# Patient Record
Sex: Female | Born: 1970
Health system: Southern US, Community
[De-identification: ages and names within clinical notes are randomized; demographics above are authoritative.]

## PROBLEM LIST (undated history)

## (undated) DIAGNOSIS — Z86718 Personal history of other venous thrombosis and embolism: Secondary | ICD-10-CM

## (undated) DIAGNOSIS — R059 Cough, unspecified: Secondary | ICD-10-CM

## (undated) DIAGNOSIS — G4459 Other complicated headache syndrome: Secondary | ICD-10-CM

## (undated) DIAGNOSIS — I82409 Acute embolism and thrombosis of unspecified deep veins of unspecified lower extremity: Secondary | ICD-10-CM

## (undated) DIAGNOSIS — T40601A Poisoning by unspecified narcotics, accidental (unintentional), initial encounter: Secondary | ICD-10-CM

## (undated) DIAGNOSIS — I10 Essential (primary) hypertension: Secondary | ICD-10-CM

## (undated) DIAGNOSIS — D649 Anemia, unspecified: Secondary | ICD-10-CM

## (undated) DIAGNOSIS — I739 Peripheral vascular disease, unspecified: Secondary | ICD-10-CM

## (undated) DIAGNOSIS — G629 Polyneuropathy, unspecified: Secondary | ICD-10-CM

## (undated) DIAGNOSIS — I350 Nonrheumatic aortic (valve) stenosis: Secondary | ICD-10-CM

## (undated) DIAGNOSIS — E1151 Type 2 diabetes mellitus with diabetic peripheral angiopathy without gangrene: Secondary | ICD-10-CM

## (undated) DIAGNOSIS — I2510 Atherosclerotic heart disease of native coronary artery without angina pectoris: Secondary | ICD-10-CM

## (undated) DIAGNOSIS — I25119 Atherosclerotic heart disease of native coronary artery with unspecified angina pectoris: Secondary | ICD-10-CM

## (undated) DIAGNOSIS — E785 Hyperlipidemia, unspecified: Secondary | ICD-10-CM

## (undated) DIAGNOSIS — F32A Depression, unspecified: Secondary | ICD-10-CM

## (undated) DIAGNOSIS — E119 Type 2 diabetes mellitus without complications: Secondary | ICD-10-CM

## (undated) DIAGNOSIS — Z720 Tobacco use: Secondary | ICD-10-CM

## (undated) DIAGNOSIS — E7800 Pure hypercholesterolemia, unspecified: Secondary | ICD-10-CM

## (undated) DIAGNOSIS — J449 Chronic obstructive pulmonary disease, unspecified: Secondary | ICD-10-CM

## (undated) DIAGNOSIS — R05 Cough: Secondary | ICD-10-CM

## (undated) DIAGNOSIS — F1120 Opioid dependence, uncomplicated: Secondary | ICD-10-CM

## (undated) HISTORY — DX: Depression, unspecified: F32.A

## (undated) HISTORY — PX: CARDIAC CATHETERIZATION: SHX172

## (undated) HISTORY — DX: Atherosclerotic heart disease of native coronary artery without angina pectoris: I25.10

## (undated) HISTORY — DX: Type 2 diabetes mellitus without complications: E11.9

## (undated) HISTORY — DX: Chronic obstructive pulmonary disease, unspecified: J44.9

## (undated) HISTORY — DX: Hyperlipidemia, unspecified: E78.5

## (undated) HISTORY — DX: Acute embolism and thrombosis of unspecified deep veins of unspecified lower extremity: I82.409

## (undated) HISTORY — DX: Polyneuropathy, unspecified: G62.9

## (undated) HISTORY — DX: Atherosclerotic heart disease of native coronary artery with unspecified angina pectoris: I25.119

## (undated) HISTORY — DX: Other complicated headache syndrome: G44.59

## (undated) HISTORY — DX: Anemia, unspecified: D64.9

## (undated) HISTORY — DX: Cough: R05

## (undated) HISTORY — DX: Poisoning by unspecified narcotics, accidental (unintentional), initial encounter: T40.601A

## (undated) HISTORY — PX: CARPAL TUNNEL RELEASE: SHX101

## (undated) HISTORY — DX: Cough, unspecified: R05.9

---

## 2007-08-01 ENCOUNTER — Emergency Department (HOSPITAL_COMMUNITY): Admission: EM | Admit: 2007-08-01 | Discharge: 2007-08-01 | Payer: Self-pay | Admitting: Emergency Medicine

## 2011-01-05 LAB — POCT I-STAT, CHEM 8
BUN: 10
Calcium, Ion: 1.21
Chloride: 105
Glucose, Bld: 131 — ABNORMAL HIGH
TCO2: 23

## 2011-01-05 LAB — POCT CARDIAC MARKERS
CKMB, poc: <1 — ABNORMAL LOW
Myoglobin, poc: 34.7
Operator id: 277751
Troponin i, poc: <0.05
Troponin i, poc: <0.05

## 2011-01-05 LAB — DIFFERENTIAL
Eosinophils Relative: 1
Lymphocytes Relative: 32
Monocytes Absolute: 0.4
Monocytes Relative: 6
Neutro Abs: 4.3

## 2011-01-05 LAB — CBC
HCT: 38.9
Hemoglobin: 13.5
MCHC: 34.7
RBC: 4.52
RDW: 14.1

## 2013-08-14 ENCOUNTER — Encounter (HOSPITAL_COMMUNITY): Payer: Self-pay | Admitting: Emergency Medicine

## 2013-08-14 ENCOUNTER — Emergency Department (HOSPITAL_COMMUNITY)
Admission: EM | Admit: 2013-08-14 | Discharge: 2013-08-15 | Discharge status: Against Medical Advice | Payer: No Typology Code available for payment source | Attending: Emergency Medicine | Admitting: Emergency Medicine

## 2013-08-14 DIAGNOSIS — M7981 Nontraumatic hematoma of soft tissue: Secondary | ICD-10-CM | POA: Insufficient documentation

## 2013-08-14 DIAGNOSIS — E1159 Type 2 diabetes mellitus with other circulatory complications: Secondary | ICD-10-CM | POA: Insufficient documentation

## 2013-08-14 DIAGNOSIS — I2510 Atherosclerotic heart disease of native coronary artery without angina pectoris: Secondary | ICD-10-CM | POA: Insufficient documentation

## 2013-08-14 DIAGNOSIS — I798 Other disorders of arteries, arterioles and capillaries in diseases classified elsewhere: Secondary | ICD-10-CM | POA: Insufficient documentation

## 2013-08-14 DIAGNOSIS — F17200 Nicotine dependence, unspecified, uncomplicated: Secondary | ICD-10-CM | POA: Insufficient documentation

## 2013-08-14 DIAGNOSIS — I10 Essential (primary) hypertension: Secondary | ICD-10-CM | POA: Insufficient documentation

## 2013-08-14 HISTORY — DX: Type 2 diabetes mellitus without complications: E11.9

## 2013-08-14 HISTORY — DX: Essential (primary) hypertension: I10

## 2013-08-14 HISTORY — DX: Pure hypercholesterolemia, unspecified: E78.00

## 2013-08-14 NOTE — ED Notes (Signed)
Called for pt for triage and no answer

## 2013-08-14 NOTE — ED Notes (Signed)
Called X3 with no response

## 2013-08-14 NOTE — ED Notes (Addendum)
Pt. reports left foot / bruise at 4th/5th left toes onset 4 days ago , denies injury , ambulatory , respirations unlabored , pt. stated doppler studies at [facility] today at left foot . Faint pedal pulses.

## 2013-08-15 ENCOUNTER — Encounter (HOSPITAL_COMMUNITY): Payer: Self-pay | Admitting: Emergency Medicine

## 2013-08-15 ENCOUNTER — Inpatient Hospital Stay (HOSPITAL_COMMUNITY)
Admission: EM | Admit: 2013-08-15 | Discharge: 2013-08-20 | DRG: 253 | Disposition: A | Discharge status: Home / Self Care | Payer: No Typology Code available for payment source | Attending: Internal Medicine | Admitting: Internal Medicine

## 2013-08-15 DIAGNOSIS — M79609 Pain in unspecified limb: Secondary | ICD-10-CM

## 2013-08-15 DIAGNOSIS — L03119 Cellulitis of unspecified part of limb: Secondary | ICD-10-CM

## 2013-08-15 DIAGNOSIS — L02619 Cutaneous abscess of unspecified foot: Secondary | ICD-10-CM | POA: Diagnosis present

## 2013-08-15 DIAGNOSIS — E785 Hyperlipidemia, unspecified: Secondary | ICD-10-CM | POA: Diagnosis present

## 2013-08-15 DIAGNOSIS — E1159 Type 2 diabetes mellitus with other circulatory complications: Secondary | ICD-10-CM | POA: Diagnosis not present

## 2013-08-15 DIAGNOSIS — L03116 Cellulitis of left lower limb: Secondary | ICD-10-CM

## 2013-08-15 DIAGNOSIS — E1151 Type 2 diabetes mellitus with diabetic peripheral angiopathy without gangrene: Secondary | ICD-10-CM | POA: Diagnosis present

## 2013-08-15 DIAGNOSIS — F17200 Nicotine dependence, unspecified, uncomplicated: Secondary | ICD-10-CM | POA: Diagnosis present

## 2013-08-15 DIAGNOSIS — I2510 Atherosclerotic heart disease of native coronary artery without angina pectoris: Secondary | ICD-10-CM | POA: Diagnosis present

## 2013-08-15 DIAGNOSIS — I798 Other disorders of arteries, arterioles and capillaries in diseases classified elsewhere: Secondary | ICD-10-CM | POA: Diagnosis present

## 2013-08-15 DIAGNOSIS — I10 Essential (primary) hypertension: Secondary | ICD-10-CM

## 2013-08-15 DIAGNOSIS — Z720 Tobacco use: Secondary | ICD-10-CM | POA: Diagnosis present

## 2013-08-15 DIAGNOSIS — E871 Hypo-osmolality and hyponatremia: Secondary | ICD-10-CM | POA: Diagnosis present

## 2013-08-15 DIAGNOSIS — I743 Embolism and thrombosis of arteries of the lower extremities: Principal | ICD-10-CM | POA: Diagnosis present

## 2013-08-15 HISTORY — DX: Atherosclerotic heart disease of native coronary artery without angina pectoris: I25.10

## 2013-08-15 HISTORY — DX: Tobacco use: Z72.0

## 2013-08-15 HISTORY — DX: Type 2 diabetes mellitus with diabetic peripheral angiopathy without gangrene: E11.51

## 2013-08-15 HISTORY — DX: Essential (primary) hypertension: I10

## 2013-08-15 HISTORY — DX: Cellulitis of left lower limb: L03.116

## 2013-08-15 HISTORY — DX: Embolism and thrombosis of arteries of the lower extremities: I74.3

## 2013-08-15 LAB — CBC WITH DIFFERENTIAL/PLATELET
Basophils Absolute: 0 10*3/uL (ref 0.0–0.1)
Basophils Relative: 0 % (ref 0–1)
EOS ABS: 0.1 10*3/uL (ref 0.0–0.7)
EOS PCT: 1 % (ref 0–5)
HEMATOCRIT: 42.6 % (ref 36.0–46.0)
HEMOGLOBIN: 14.6 g/dL (ref 12.0–15.0)
LYMPHS ABS: 2.5 10*3/uL (ref 0.7–4.0)
LYMPHS PCT: 26 % (ref 12–46)
MCH: 29.9 pg (ref 26.0–34.0)
MCHC: 34.3 g/dL (ref 30.0–36.0)
MCV: 87.3 fL (ref 78.0–100.0)
MONOS PCT: 6 % (ref 3–12)
Monocytes Absolute: 0.6 10*3/uL (ref 0.1–1.0)
Neutro Abs: 6.3 10*3/uL (ref 1.7–7.7)
Neutrophils Relative %: 67 % (ref 43–77)
Platelets: 200 10*3/uL (ref 150–400)
RBC: 4.88 MIL/uL (ref 3.87–5.11)
RDW: 14 % (ref 11.5–15.5)
WBC: 9.6 10*3/uL (ref 4.0–10.5)

## 2013-08-15 LAB — I-STAT CG4 LACTIC ACID, ED: Lactic Acid, Venous: 1.03 mmol/L (ref 0.5–2.2)

## 2013-08-15 LAB — BASIC METABOLIC PANEL
BUN: 8 mg/dL (ref 6–23)
CALCIUM: 8.8 mg/dL (ref 8.4–10.5)
CO2: 20 meq/L (ref 19–32)
Chloride: 98 mEq/L (ref 96–112)
Creatinine, Ser: 0.44 mg/dL — ABNORMAL LOW (ref 0.50–1.10)
GFR calc Af Amer: >90 mL/min (ref >90)
GLUCOSE: 181 mg/dL — AB (ref 70–99)
Potassium: 3.7 mEq/L (ref 3.7–5.3)
Sodium: 131 mEq/L — ABNORMAL LOW (ref 137–147)

## 2013-08-15 LAB — PROTIME-INR
INR: 0.87 (ref 0.00–1.49)
Prothrombin Time: 11.7 seconds (ref 11.6–15.2)

## 2013-08-15 LAB — CK: Total CK: 62 U/L (ref 7–177)

## 2013-08-15 LAB — GLUCOSE, CAPILLARY: Glucose-Capillary: 263 mg/dL — ABNORMAL HIGH (ref 70–99)

## 2013-08-15 LAB — APTT: APTT: 29 s (ref 24–37)

## 2013-08-15 MED ORDER — SODIUM CHLORIDE 0.9 % IV SOLN
INTRAVENOUS | Status: DC
  Administered 2013-08-15 (×2): via INTRAVENOUS

## 2013-08-15 MED ORDER — CEFAZOLIN SODIUM 1-5 GM-% IV SOLN
1.0000 g | Freq: Three times a day (TID) | INTRAVENOUS | Status: DC
  Filled 2013-08-15 (×2): qty 50

## 2013-08-15 MED ORDER — NICOTINE 21 MG/24HR TD PT24
21.0000 mg | MEDICATED_PATCH | Freq: Every day | TRANSDERMAL | Status: DC
Start: 2013-08-15 — End: 2013-08-15

## 2013-08-15 MED ORDER — SODIUM CHLORIDE 0.9 % IJ SOLN
3.0000 mL | Freq: Two times a day (BID) | INTRAMUSCULAR | Status: DC
  Administered 2013-08-16 – 2013-08-19 (×6): 3 mL via INTRAVENOUS

## 2013-08-15 MED ORDER — ATORVASTATIN CALCIUM 80 MG PO TABS
80.0000 mg | ORAL_TABLET | Freq: Every day | ORAL | Status: DC
  Administered 2013-08-15 – 2013-08-19 (×4): 80 mg via ORAL
  Filled 2013-08-15 (×7): qty 1

## 2013-08-15 MED ORDER — OXYCODONE HCL 5 MG PO TABS
10.0000 mg | ORAL_TABLET | ORAL | Status: DC | PRN
  Administered 2013-08-15 – 2013-08-16 (×5): 10 mg via ORAL
  Filled 2013-08-15 (×5): qty 2

## 2013-08-15 MED ORDER — CEFAZOLIN SODIUM 1-5 GM-% IV SOLN
1.0000 g | Freq: Three times a day (TID) | INTRAVENOUS | Status: DC
  Administered 2013-08-15 – 2013-08-16 (×2): 1 g via INTRAVENOUS
  Administered 2013-08-16 (×2): 2 g via INTRAVENOUS
  Administered 2013-08-17 – 2013-08-18 (×4): 1 g via INTRAVENOUS
  Filled 2013-08-15 (×10): qty 50

## 2013-08-15 MED ORDER — SODIUM CHLORIDE 0.9 % IV SOLN
INTRAVENOUS | Status: DC
  Administered 2013-08-16 (×2): via INTRAVENOUS

## 2013-08-15 MED ORDER — DOCUSATE SODIUM 100 MG PO CAPS
100.0000 mg | ORAL_CAPSULE | Freq: Two times a day (BID) | ORAL | Status: DC
  Administered 2013-08-16 – 2013-08-20 (×7): 100 mg via ORAL
  Filled 2013-08-15 (×11): qty 1

## 2013-08-15 MED ORDER — ACETAMINOPHEN 325 MG PO TABS: 650.0000 mg | ORAL_TABLET | Freq: Four times a day (QID) | ORAL | Status: DC | PRN

## 2013-08-15 MED ORDER — NICOTINE POLACRILEX 2 MG MT GUM
4.0000 mg | CHEWING_GUM | OROMUCOSAL | Status: DC | PRN
  Filled 2013-08-15: qty 2

## 2013-08-15 MED ORDER — ONDANSETRON HCL 4 MG/2ML IJ SOLN
4.0000 mg | Freq: Four times a day (QID) | INTRAMUSCULAR | Status: DC | PRN
  Administered 2013-08-16: 4 mg via INTRAVENOUS
  Filled 2013-08-15: qty 2

## 2013-08-15 MED ORDER — BISACODYL 10 MG RE SUPP: 10.0000 mg | Freq: Every day | RECTAL | Status: DC | PRN

## 2013-08-15 MED ORDER — POLYETHYLENE GLYCOL 3350 17 G PO PACK
17.0000 g | PACK | Freq: Every day | ORAL | Status: DC | PRN
  Filled 2013-08-15: qty 1

## 2013-08-15 MED ORDER — SENNA 8.6 MG PO TABS
1.0000 | ORAL_TABLET | Freq: Two times a day (BID) | ORAL | Status: DC
  Administered 2013-08-16 – 2013-08-20 (×6): 8.6 mg via ORAL
  Filled 2013-08-15 (×11): qty 1

## 2013-08-15 MED ORDER — MORPHINE SULFATE 4 MG/ML IJ SOLN
4.0000 mg | Freq: Once | INTRAMUSCULAR | Status: AC
  Administered 2013-08-15: 4 mg via INTRAVENOUS
  Filled 2013-08-15: qty 1

## 2013-08-15 MED ORDER — HEPARIN (PORCINE) IN NACL 100-0.45 UNIT/ML-% IJ SOLN
1450.0000 [IU]/h | INTRAMUSCULAR | Status: DC
  Administered 2013-08-15: 1150 [IU]/h via INTRAVENOUS
  Filled 2013-08-15 (×2): qty 250

## 2013-08-15 MED ORDER — ONDANSETRON HCL 4 MG PO TABS
4.0000 mg | ORAL_TABLET | Freq: Four times a day (QID) | ORAL | Status: DC | PRN
  Administered 2013-08-17: 4 mg via ORAL
  Filled 2013-08-15: qty 1

## 2013-08-15 MED ORDER — INSULIN ASPART 100 UNIT/ML ~~LOC~~ SOLN: 0.0000 [IU] | Freq: Every day | SUBCUTANEOUS | Status: DC

## 2013-08-15 MED ORDER — HEPARIN BOLUS VIA INFUSION
4000.0000 [IU] | Freq: Once | INTRAVENOUS | Status: AC
Start: 2013-08-15 — End: 2013-08-15
  Administered 2013-08-15: 4000 [IU] via INTRAVENOUS
  Filled 2013-08-15: qty 4000

## 2013-08-15 MED ORDER — HYDROMORPHONE HCL PF 1 MG/ML IJ SOLN
1.0000 mg | Freq: Once | INTRAMUSCULAR | Status: AC
  Administered 2013-08-15: 1 mg via INTRAVENOUS
  Filled 2013-08-15: qty 1

## 2013-08-15 MED ORDER — INSULIN ASPART 100 UNIT/ML ~~LOC~~ SOLN: 0.0000 [IU] | Freq: Three times a day (TID) | SUBCUTANEOUS | Status: DC

## 2013-08-15 MED ORDER — ACETAMINOPHEN 650 MG RE SUPP
650.0000 mg | Freq: Four times a day (QID) | RECTAL | Status: DC | PRN
Start: 2013-08-15 — End: 2013-08-20

## 2013-08-15 MED ORDER — NICOTINE 10 MG IN INHA: 1.0000 | RESPIRATORY_TRACT | Status: DC | PRN

## 2013-08-15 MED ORDER — HYDROMORPHONE HCL PF 1 MG/ML IJ SOLN
1.0000 mg | INTRAMUSCULAR | Status: DC | PRN
  Administered 2013-08-15 – 2013-08-17 (×5): 1 mg via INTRAVENOUS
  Filled 2013-08-15 (×5): qty 1

## 2013-08-15 MED ORDER — LORAZEPAM 2 MG/ML IJ SOLN
1.0000 mg | Freq: Once | INTRAMUSCULAR | Status: AC
  Administered 2013-08-15: 1 mg via INTRAVENOUS
  Filled 2013-08-15: qty 1

## 2013-08-15 MED ORDER — ONDANSETRON HCL 4 MG/2ML IJ SOLN
4.0000 mg | Freq: Once | INTRAMUSCULAR | Status: AC
  Administered 2013-08-15: 4 mg via INTRAVENOUS
  Filled 2013-08-15: qty 2

## 2013-08-15 MED ORDER — LISINOPRIL 5 MG PO TABS
5.0000 mg | ORAL_TABLET | Freq: Every day | ORAL | Status: DC
  Administered 2013-08-15: 5 mg via ORAL
  Filled 2013-08-15 (×2): qty 1

## 2013-08-15 NOTE — ED Notes (Signed)
NOTIFIED DR. POLLINA IN PERSON FOR PATIENTS LAB RESUTS OF CG4+ LACTIC ACID ,@13 :28 PM ,08/15/2013.

## 2013-08-15 NOTE — Progress Notes (Signed)
ANTICOAGULATION CONSULT NOTE - Initial Consult  Pharmacy Consult for heparin Indication: Arterial embolism LLE  Allergies  Allergen Reactions  . Fish Allergy   . Ibuprofen     Patient Measurements:   Heparin Dosing Weight: 80.7 kg  Vital Signs: Temp: 97.9 F (36.6 C) (05/06 2105) Temp src: Axillary (05/06 2105) BP: 129/64 mmHg (05/06 2105) Pulse Rate: 95 (05/06 2105)  Labs:  Recent Labs  08/15/13 1233  HGB 14.6  HCT 42.6  PLT 200  APTT 29  LABPROT 11.7  INR 0.87  CREATININE 0.44*  CKTOTAL 62    The CrCl is unknown because both a height and weight (above a minimum accepted value) are required for this calculation.   Medical History: Past Medical History  Diagnosis Date  . Hypertension   . Diabetes mellitus with peripheral vascular disease   . Hypercholesterolemia   . Tobacco abuse   . Coronary artery disease     NON OBSTRUCTIVE    Medications:  Scheduled:  . atorvastatin  80 mg Oral q1800  .  ceFAZolin (ANCEF) IV  1 g Intravenous 3 times per day  . docusate sodium  100 mg Oral BID  . insulin aspart  0-5 Units Subcutaneous QHS  . insulin aspart  0-9 Units Subcutaneous TID WC  . lisinopril  5 mg Oral Daily  . senna  1 tablet Oral BID  . sodium chloride  3 mL Intravenous Q12H   Infusions:  . sodium chloride 75 mL/hr at 08/15/13 1817    Assessment: 43 yo female with LLE arterial embolism will be started on heparin therapy.  SCr 0.44; Hgb 14.6 and Plt 200 K on 05/06.  Patient was not on any anticoagulation prior to admission. INR 0.87 and aPTT 29  Goal of Therapy:  Heparin level 0.3-0.7 units/ml Monitor platelets by anticoagulation protocol: Yes   Plan:  1) Heparin 4000 units iv bolus x1, then start heparin drip at 1150 units/hr 2) 6hr heparin level 3) Daily heparin level and CBC  Tsz-Yin Cyruss Arata 08/15/2013,9:58 PM

## 2013-08-15 NOTE — Consult Note (Signed)
[facility] Consult  Reason for Consult:  Ischemic toes Referring Physician:  ER MRN #:  [id number]  History of Present Illness: This is a 42 y.o. female who presents to the ER today with complaints of severe pain in the toes of her left foot.  She states that this started 5-8 days ago.  She states that she cannot move her toes on her left foot.  She states that she has had pain in both calves as well.  She states that she has not been able to walk more than a block due to pain in her legs.  She states that she has also had chest pain, which she had a cardiac catheterization in 2010, but does not know if a stent was placed or if she had angioplasty.  She does state that at that time, she did have blockage in one artery.  This was done at [facility].  She states she continues to have occasional chest pain and it seems more severe than it was in 2010.    She does smoke 1-1.5 ppd of cigarettes.  She has a hx of HTN, DM, and hypercholesterolemia, but is not on any medications for these.  Past Medical History  Diagnosis Date  . Hypertension   . Diabetes mellitus without complication   . Hypercholesterolemia    Past Surgical History  Procedure Laterality Date  . Carpal tunnel release      Allergies  Allergen Reactions  . Fish Allergy   . Ibuprofen     Prior to Admission medications   Medication Sig Start Date End Date Taking? Authorizing Provider  Oxycodone HCl 10 MG TABS Take 10 mg by mouth 3 (three) times daily.   Yes Historical Provider, MD    History   Social History  . Marital Status: Single    Spouse Name: N/A    Number of Children: N/A  . Years of Education: N/A   Occupational History  . Not on file.   Social History Main Topics  . Smoking status: Current Every Day Smoker  . Smokeless tobacco: Not on file  . Alcohol Use: No  . Drug Use: No  . Sexual Activity: Not on file   Other Topics Concern  . Not on file   Social History Narrative   . No narrative on file     ROS: [x] Positive   [ ] Negative   [ ] All sytems reviewed and are negative  Cardiovascular: [x] chest pain/pressure-history of cardiac cath in  [] palpitations [] SOB lying flat [] DOE [x] pain in legs while walking [x] pain in legs at rest [x] pain in legs at night [] non-healing ulcers [] hx of DVT [] swelling in legs  Pulmonary: [] productive cough [] asthma/wheezing [] home O2  Neurologic: [] weakness in [] arms [] legs [] numbness in [] arms [] legs [] hx of CVA [] mini stroke []difficulty speaking or slurred speech [] temporary loss of vision in one eye [] dizziness  Hematologic: [] hx of cancer [] bleeding problems [] problems with blood clotting easily  Endocrine:   [x] diabetes [] thyroid disease  GI [] vomiting blood [] blood in stool  GU: [] CKD/renal failure [] HD--[] M/W/F or [] T/T/S [] burning with urination [] blood in urine  Psychiatric: [] anxiety [] depression  Musculoskeletal: [] arthritis [] joint pain  Integumentary: [] rashes [] ulcers  Constitutional: [] fever [] chills   Physical Examination    Filed Vitals:   08/15/13 1300  BP: 142/56  Pulse: 77  Temp:   Resp:    There is no weight on file to calculate BMI.  General:  WDWN in NAD Gait: Not observed HENT: WNL, normocephalic Eyes: Pupils equal Pulmonary: normal non-labored breathing, without Rales, rhonchi,  wheezing Cardiac: regular, without  Murmurs, rubs or gallops; without carotid bruits Abdomen: soft, NT/ND, no masses Skin: with erythema to dorsum of left foot, without ulcers  Vascular Exam/Pulses:  Right Left  Radial 2+ (normal) 2+ (normal)  Ulnar 1+ (weak) Non palpable  Femoral 3+ (normal) 3+ (normal)  Popliteal 2+ (normal) Non palpable  DP 2+ (normal) Non palpable  PT 1+ (weak) Non palpable   Extremities: with ischemic changes to the 1st, 4th and 5th left toes, without Gangrene , with cellulitis of dorsum of left  foot; without open wounds; + monophasic signal of the left PT/DP; + biphasic signal of the right DP/PT; diminished dorsiflexion of left foot.  Calves are non tender bilaterally Musculoskeletal: no muscle wasting or atrophy  Neurologic: A&O X 3; Appropriate Affect ; SENSATION: normal; MOTOR FUNCTION:  moving all extremities equally. Speech is fluent/normal Psychiatric:  Mildly distressed but appropriate  Lymph:  No inguinal lymphadenopathy   CBC    Component Value Date/Time   WBC 9.6 08/15/2013 1233   RBC 4.88 08/15/2013 1233   HGB 14.6 08/15/2013 1233   HCT 42.6 08/15/2013 1233   PLT 200 08/15/2013 1233   MCV 87.3 08/15/2013 1233   MCH 29.9 08/15/2013 1233   MCHC 34.3 08/15/2013 1233   RDW 14.0 08/15/2013 1233   LYMPHSABS 2.5 08/15/2013 1233   MONOABS 0.6 08/15/2013 1233   EOSABS 0.1 08/15/2013 1233   BASOSABS 0.0 08/15/2013 1233    BMET    Component Value Date/Time   NA 131* 08/15/2013 1233   K 3.7 08/15/2013 1233   CL 98 08/15/2013 1233   CO2 20 08/15/2013 1233   GLUCOSE 181* 08/15/2013 1233   BUN 8 08/15/2013 1233   CREATININE 0.44* 08/15/2013 1233   CALCIUM 8.8 08/15/2013 1233   GFRNONAA >90 08/15/2013 1233   GFRAA >90 08/15/2013 1233    COAGS: Lab Results  Component Value Date   INR 0.87 08/15/2013     Non-Invasive Vascular Imaging:   ABI's at outside facility 08/14/13: Right:  1.1 Left:  0.4  Statin:  no Beta Blocker:  no Aspirin:  no ACEI:  no ARB:  no Other antiplatelets/anticoagulants:  no   ASSESSMENT: This is a 43 y.o. female with ischemic toes of the left foot with severe pain that started ~ 5-8 days ago.  Has most likely embolized to toes of the left foot from an SFA lesion.  Will start heparin and have aortogram tomorrow to see if lesion is amendable to treatment in the [facility].    PLAN: -admit to hospital by hospitalist given her multiple medical problems -will start heparin and d/c on call to the [facility] -will schedule for aortogram with bilateral lower extremity runoff and possible  intervention by Dr. Bridgett Larsson tomorrow -labs in am -NPO after MN   Leontine Locket, PA-C [facility] [phone]  Agree with above assessment Patient with likely superficial femoral occlusive disease and embolus to toes of left foot. She does have chronic claudication symptoms. She does have diabetes mellitus and heavy history of tobacco abuse  Patient also has had cardiac catheterization but does not think she has had angioplasty or stent placed-not on regular followup Will treat  patient with IV heparin and perform angiography tomorrow per Dr.Chen to see if there is left leg lesion amenable to percutaneous intervention. We'll then need to have left foot follow to see if fourth and fifth toes heal  or if toe amputations eventually become indicated and necessary. Discussed this with patient and she is in agreement Patient to be admitted to medical service because of cardiac issues-history of intermittent chest discomfort but no history of myocardial infarction 

## 2013-08-15 NOTE — ED Notes (Signed)
Pt very angry, attempting to leave stating she wants her IV out. Dr. Betsey Holiday at bedside explaining options. Pt agreeable to stay.

## 2013-08-15 NOTE — ED Notes (Signed)
Pt sent here for blood clot in left leg. sts sent here for IV heparin. Pt toes blue on the left foot.

## 2013-08-15 NOTE — H&P (Addendum)
[facility] History and Physical  Toni Logan WJX:[id number] DOB: [birth year] DOA: 08/15/2013  Referring physician:  Malachy Moan PCP:  LAND, PHILLIP, PA-C   Chief Complaint:  Left foot pain and discoloration  HPI:  The patient is a 43 y.o. year-old female with history of diabetes mellitus, hypertension, hypercholesterolemia, tobacco abuse, chronic leg pain since childhood who presents with progressive pain and discoloration of the left fourth and fifth toes.  She has not taken medications in several years because she did not have health insurance.  She recently got health insurance in January, but had not gone her to primary doctor to restart medications.  The patient was last at their baseline health approximately 5 days ago.  She developed some bruising and "ripping" 10/10 pain on the left foot approximately 4-5 days ago which spread to her 4th and 5th toes of the left foot.  Movement, bearing weight, and touching toes makes this pain worse.  Oral oxycodone 10mg  did not help much.  She was seen a [facility] on 5/5 and underwent ABIs which reportedly demonstrated 0.4 on the left and 1.1 on the right. She had duplex which demonstrated mid superior femoral artery occlusion with reconstitution in the mid to distal SFA with prominent collaterals.  She was seen by Doctor Ruel Favors, vascular surgeon, in the ER, who believes this may be an acute embolic event caused by showering of clot from the femoral artery region.  She is being admitted to medicine for management of her chronic medical problems.    On ROS, she has had intermittent substernal chest pains without significant SOB, nausea, diaphoresis which occur at rest and with exertion.  These pains happen several times per day and have been occurring for several years.  She underwent cardiac catheterization 6 years ago which demonstrated non-occlusive CAD of a single vessel.    Review of Systems:  General:  Denies fevers, chills, weight  loss or gain HEENT:  Denies changes to hearing and vision, + recent rhinorrhea, sinus congestion, sore throat CV:  + intermittent chest pain. denies palpitations, lower extremity edema.  PULM:  Denies SOB, wheezing, + cough.   GI:  Denies nausea, vomiting, constipation, diarrhea.   GU:  Denies dysuria, frequency, urgency ENDO:  Denies polyuria, polydipsia.   HEME:  Denies hematemesis, blood in stools, melena, abnormal bruising or bleeding.  LYMPH:  Denies lymphadenopathy.   MSK:  Denies arthralgias, myalgias.   DERM:  Denies skin rash or ulcer.   NEURO:  Denies focal numbness, weakness, slurred speech, confusion, facial droop.  PSYCH:  Denies anxiety and depression.    Past Medical History  Diagnosis Date  . Hypertension   . Diabetes mellitus with peripheral vascular disease   . Hypercholesterolemia   . Tobacco abuse    Past Surgical History  Procedure Laterality Date  . Carpal tunnel release    . Cardiac catheterization     Social History:  reports that she has been smoking Cigarettes.  She has a 30 pack-year smoking history. She does not have any smokeless tobacco history on file. She reports that she does not drink alcohol or use illicit drugs. Lives with her wife in an apartment.  Does not use assist device, drives, independent for all ADLs  Allergies  Allergen Reactions  . Fish Allergy   . Ibuprofen     Family History  Problem Relation Age of Onset  . Heart failure Mother   . Heart attack Mother   . Heart disease Father  s/p CABG  . Diabetes    . High blood pressure    . High Cholesterol    . Lung cancer Maternal Uncle      Prior to Admission medications   Medication Sig Start Date End Date Taking? Authorizing Provider  Oxycodone HCl 10 MG TABS Take 10 mg by mouth 3 (three) times daily.   Yes Historical Provider, MD   Physical Exam: Filed Vitals:   08/15/13 1300 08/15/13 1400 08/15/13 1434 08/15/13 1637  BP: 142/56 168/77 190/98 149/64  Pulse: 77 106 131  73  Temp:    98.5 F (36.9 C)  TempSrc:    Oral  Resp:   24 16  SpO2: 94% 95% 98% 95%     General:  CF, NAD  Eyes:  PERRL, anicteric, non-injected.  ENT:  Nares clear.  OP clear, non-erythematous without plaques or exudates.  MMM.  Neck:  Supple without TM or JVD.    Lymph:  No cervical, supraclavicular, or submandibular LAD.  Cardiovascular:  RRR, normal S1, S2, 2/6 systolic murmur LSB and RUSB.    Respiratory:  CTA bilaterally without increased WOB.  Abdomen:  NABS.  Soft, ND/NT.    Skin:  No rashes or focal lesions.  Musculoskeletal:  Normal bulk and tone.  No LE edema.  Left foot absent pedal pulse to palpation, right foot 2+ pulse.  Half of first toe left foot dusky, all of 4th and 5th toes dusky with extension about 1-2cm below level of toe.  Erythema streaking over dorsum of foot to ankle   Psychiatric:  A & O x 4.  Appropriate affect.  Neurologic:  CN 3-12 intact.  5/5 strength.  Sensation intact.  Labs on Admission:  Basic Metabolic Panel:  Recent Labs Lab 08/15/13 1233  NA 131*  K 3.7  CL 98  CO2 20  GLUCOSE 181*  BUN 8  CREATININE 0.44*  CALCIUM 8.8   Liver Function Tests: No results found for this basename: AST, ALT, ALKPHOS, BILITOT, PROT, ALBUMIN,  in the last 168 hours No results found for this basename: LIPASE, AMYLASE,  in the last 168 hours No results found for this basename: AMMONIA,  in the last 168 hours CBC:  Recent Labs Lab 08/15/13 1233  WBC 9.6  NEUTROABS 6.3  HGB 14.6  HCT 42.6  MCV 87.3  PLT 200   Cardiac Enzymes:  Recent Labs Lab 08/15/13 1233  CKTOTAL 62    BNP (last 3 results) No results found for this basename: PROBNP,  in the last 8760 hours CBG: No results found for this basename: GLUCAP,  in the last 168 hours  Radiological Exams on Admission: No results found.  EKG: Independently reviewed. NSR, suggestion of previous septal infarct.  ECG read possible previous inferior infarct also, but not  convincing  Assessment/Plan Active Problems:   Arterial embolism of left leg  ---  Ischemic left toes due to arterial embolism of the left leg with possible overlying cellulitis or superficial thrombophlebitis -  Agree with antibiotics -  Heparin gtt -  Aortogram with bilateral lower extremity runoff tomorrow -  NPO after MN for angiography -  Dilaudid prn pain  Intermittent chest pains, atypical.  Does have risk factors for heart disease, however, and ECG is abnormal.  Attempted to get records from [facility]  -  ECHO  -  Telemetry -  Perioperative risk of CV between 0.42% and 2.4%  HTN/HLD, blood pressures elevated but likely due to agitation and pain -  Control  pain -  Start ACEI  -  Lipid panel -  Start atorvastatin  DM, hyperglycemic in ER -  Start low dose SSI -  A1c -  Diabetic diet   Hyponatremia, likely secondary to dehydration -  IVF and repeat BMP in AM  Tobacco abuse -  Patient declines patch b/c it causes rash -  Start puffer prn  Diet:  Diabetic, healthy heart Access:  PIV IVF:  yes Proph:  Heparin gtt  Code Status: full code Family Communication: patient and wife Disposition Plan: Admit to telemetry  Time spent: 60 min [facility] Pager [phone]  If 7PM-7AM, please contact night-coverage [email or web address] Password [facility] 08/15/2013, 4:38 PM

## 2013-08-15 NOTE — ED Notes (Signed)
Pt refusing nicotine patch states "it irritates my skin".

## 2013-08-15 NOTE — ED Provider Notes (Addendum)
CSN: [id number]     Arrival date & time 08/15/13  1026 History   First MD Initiated Contact with Patient 08/15/13 1123     Chief Complaint  Patient presents with  . DVT     (Consider location/radiation/quality/duration/timing/severity/associated sxs/prior Treatment) HPI Comments: Patient presents to the ER for evaluation of arterial blood clot in the left leg. Patient reports that she had onset of pain in the left foot 5 days ago. The next day, she noticed that her left fourth and fifth toes have turned blue. She underwent outpatient arterial ultrasound and ABI yesterday at [facility], both of which were abnormal. She was referred to the ER for further management.   Past Medical History  Diagnosis Date  . Hypertension   . Diabetes mellitus without complication   . Hypercholesterolemia    Past Surgical History  Procedure Laterality Date  . Carpal tunnel release     History reviewed. No pertinent family history. History  Substance Use Topics  . Smoking status: Current Every Day Smoker  . Smokeless tobacco: Not on file  . Alcohol Use: No   OB History   Grav Para Term Preterm Abortions TAB SAB Ect Mult Living                 Review of Systems  Musculoskeletal:       Foot pain  Skin: Positive for color change.  All other systems reviewed and are negative.     Allergies  Fish allergy and Ibuprofen  Home Medications   Prior to Admission medications   Medication Sig Start Date End Date Taking? Authorizing Provider  Oxycodone HCl 10 MG TABS Take 10 mg by mouth 3 (three) times daily.   Yes Historical Provider, MD   BP 190/98  Pulse 131  Temp(Src) 98.6 F (37 C) (Oral)  Resp 24  SpO2 98%  LMP 07/25/2013 Physical Exam  Constitutional: She is oriented to person, place, and time. She appears well-developed and well-nourished. No distress.  HENT:  Head: Normocephalic and atraumatic.  Right Ear: Hearing normal.  Left Ear: Hearing normal.  Nose: Nose normal.   Mouth/Throat: Oropharynx is clear and moist and mucous membranes are normal.  Eyes: Conjunctivae and EOM are normal. Pupils are equal, round, and reactive to light.  Neck: Normal range of motion. Neck supple.  Cardiovascular: Regular rhythm, S1 normal and S2 normal.  Exam reveals no gallop and no friction rub.   No murmur heard. Pulmonary/Chest: Effort normal and breath sounds normal. No respiratory distress. She exhibits no tenderness.  Abdominal: Soft. Normal appearance and bowel sounds are normal. There is no hepatosplenomegaly. There is no tenderness. There is no rebound, no guarding, no tenderness at McBurney's point and negative Murphy's sign. No hernia.  Musculoskeletal: Normal range of motion.  Neurological: She is alert and oriented to person, place, and time. She has normal strength. No cranial nerve deficit or sensory deficit. Coordination normal. GCS eye subscore is 4. GCS verbal subscore is 5. GCS motor subscore is 6.  Skin: Skin is warm, dry and intact. No rash noted. No cyanosis.     Psychiatric: She has a normal mood and affect. Her speech is normal and behavior is normal. Thought content normal.    ED Course  Procedures (including critical care time) Labs Review Labs Reviewed  BASIC METABOLIC PANEL - Abnormal; Notable for the following:    Sodium 131 (*)    Glucose, Bld 181 (*)    Creatinine, Ser 0.44 (*)  All other components within normal limits  CBC WITH DIFFERENTIAL  CK  APTT  PROTIME-INR  I-STAT CG4 LACTIC ACID, ED    Imaging Review No results found.   EKG Interpretation   Date/Time:  Wednesday Aug 15 2013 15:02:35 EDT Ventricular Rate:  80 PR Interval:  162 QRS Duration: 88 QT Interval:  388 QTC Calculation: 448 R Axis:   -27 Text Interpretation:  Sinus rhythm Inferior infarct, old Anteroseptal  infarct, old No significant change since last tracing Confirmed by Melinna Linarez   MD, Wing Schoch [phone]) on 08/15/2013 3:10:36 PM      MDM   Final  diagnoses:  Arterial embolism of left leg    Patient presents to the ER for evaluation of arterial occlusion of the left lower leg. Patient had onset of foot pain 5 days ago followed by cyanosis of the fourth and fifth digits 4 days ago. She had outpatient arterial ultrasound and ABI yesterday, both of which were abnormal. Ultrasound reveals no flow within a segment of the mid superficial femoral artery. There is reconstitution of flow in the mid to distal SFA. There is prominent vascular collaterals noted. ABI on the right was 1.1, ABI on the left was 0.4.  This is consistent with some chronic occlusion of the left lower leg with a superimposed acute process. Doctor Kellie Simmering, on call for vascular surgery has been consulted. He has seen the patient in the ER and recommended admission on IV heparin. Patient is to be admitted to the hospital today for internal medicine. Doctor Kellie Simmering anticipates intervention tomorrow.  Orpah Greek, MD 08/15/13 [place], MD 08/15/13 [phone]

## 2013-08-15 NOTE — ED Notes (Signed)
Attempted report 

## 2013-08-16 ENCOUNTER — Encounter (HOSPITAL_COMMUNITY)
Admission: EM | Disposition: A | Discharge status: Home / Self Care | Payer: Self-pay | Source: Home / Self Care | Attending: Internal Medicine

## 2013-08-16 ENCOUNTER — Inpatient Hospital Stay (HOSPITAL_COMMUNITY): Payer: No Typology Code available for payment source | Admitting: Anesthesiology

## 2013-08-16 ENCOUNTER — Encounter (HOSPITAL_COMMUNITY): Payer: No Typology Code available for payment source | Admitting: Anesthesiology

## 2013-08-16 DIAGNOSIS — I10 Essential (primary) hypertension: Secondary | ICD-10-CM | POA: Diagnosis not present

## 2013-08-16 DIAGNOSIS — I70229 Atherosclerosis of native arteries of extremities with rest pain, unspecified extremity: Secondary | ICD-10-CM

## 2013-08-16 DIAGNOSIS — I798 Other disorders of arteries, arterioles and capillaries in diseases classified elsewhere: Secondary | ICD-10-CM | POA: Diagnosis not present

## 2013-08-16 DIAGNOSIS — I743 Embolism and thrombosis of arteries of the lower extremities: Secondary | ICD-10-CM | POA: Diagnosis not present

## 2013-08-16 DIAGNOSIS — E1159 Type 2 diabetes mellitus with other circulatory complications: Secondary | ICD-10-CM | POA: Diagnosis not present

## 2013-08-16 HISTORY — PX: THROMBECTOMY AND REVISION OF ARTERIOVENTOUS (AV) GORETEX  GRAFT: SHX6120

## 2013-08-16 HISTORY — PX: ABDOMINAL ANGIOGRAM: SHX5499

## 2013-08-16 HISTORY — PX: PERCUTANEOUS STENT INTERVENTION: SHX5500

## 2013-08-16 LAB — BASIC METABOLIC PANEL
BUN: 7 mg/dL (ref 6–23)
CALCIUM: 8.5 mg/dL (ref 8.4–10.5)
CHLORIDE: 101 meq/L (ref 96–112)
CO2: 22 meq/L (ref 19–32)
CREATININE: 0.46 mg/dL — AB (ref 0.50–1.10)
GFR calc Af Amer: >90 mL/min (ref >90)
GFR calc non Af Amer: >90 mL/min (ref >90)
GLUCOSE: 157 mg/dL — AB (ref 70–99)
Potassium: 3.9 mEq/L (ref 3.7–5.3)
Sodium: 135 mEq/L — ABNORMAL LOW (ref 137–147)

## 2013-08-16 LAB — LIPID PANEL
CHOLESTEROL: 211 mg/dL — AB (ref 0–200)
HDL: 33 mg/dL — ABNORMAL LOW (ref >39)
LDL CALC: 129 mg/dL — AB (ref 0–99)
Total CHOL/HDL Ratio: 6.4 RATIO
Triglycerides: 247 mg/dL — ABNORMAL HIGH (ref <150)
VLDL: 49 mg/dL — ABNORMAL HIGH (ref 0–40)

## 2013-08-16 LAB — HEPARIN LEVEL (UNFRACTIONATED)

## 2013-08-16 LAB — GLUCOSE, CAPILLARY
GLUCOSE-CAPILLARY: 136 mg/dL — AB (ref 70–99)
Glucose-Capillary: 131 mg/dL — ABNORMAL HIGH (ref 70–99)
Glucose-Capillary: 134 mg/dL — ABNORMAL HIGH (ref 70–99)
Glucose-Capillary: 148 mg/dL — ABNORMAL HIGH (ref 70–99)

## 2013-08-16 LAB — CBC
HCT: 40.2 % (ref 36.0–46.0)
Hemoglobin: 13.8 g/dL (ref 12.0–15.0)
MCH: 30.1 pg (ref 26.0–34.0)
MCHC: 34.3 g/dL (ref 30.0–36.0)
MCV: 87.6 fL (ref 78.0–100.0)
Platelets: 201 10*3/uL (ref 150–400)
RBC: 4.59 MIL/uL (ref 3.87–5.11)
RDW: 14.2 % (ref 11.5–15.5)
WBC: 7.4 10*3/uL (ref 4.0–10.5)

## 2013-08-16 LAB — HEMOGLOBIN A1C
HEMOGLOBIN A1C: 7.9 % — AB (ref <5.7)
Mean Plasma Glucose: 180 mg/dL — ABNORMAL HIGH (ref <117)

## 2013-08-16 LAB — POCT ACTIVATED CLOTTING TIME
ACTIVATED CLOTTING TIME: 182 s
Activated Clotting Time: 215 seconds

## 2013-08-16 SURGERY — THROMBECTOMY AND REVISION OF ARTERIOVENTOUS (AV) GORETEX  GRAFT
Anesthesia: General | Site: Leg Lower | Laterality: Left

## 2013-08-16 SURGERY — ABDOMINAL ANGIOGRAM
Anesthesia: LOCAL

## 2013-08-16 MED ORDER — ROCURONIUM BROMIDE 100 MG/10ML IV SOLN
INTRAVENOUS | Status: DC | PRN
  Administered 2013-08-16: 40 mg via INTRAVENOUS

## 2013-08-16 MED ORDER — MIDAZOLAM HCL 2 MG/2ML IJ SOLN
INTRAMUSCULAR | Status: AC
  Filled 2013-08-16: qty 2

## 2013-08-16 MED ORDER — THROMBIN 20000 UNITS EX SOLR
CUTANEOUS | Status: AC
  Filled 2013-08-16: qty 20000

## 2013-08-16 MED ORDER — FENTANYL CITRATE 0.05 MG/ML IJ SOLN
INTRAMUSCULAR | Status: AC
  Filled 2013-08-16: qty 2

## 2013-08-16 MED ORDER — LACTATED RINGERS IV SOLN
INTRAVENOUS | Status: DC | PRN
  Administered 2013-08-16 (×3): via INTRAVENOUS

## 2013-08-16 MED ORDER — LORAZEPAM 1 MG PO TABS
1.0000 mg | ORAL_TABLET | Freq: Once | ORAL | Status: AC
  Administered 2013-08-17: 1 mg via ORAL
  Filled 2013-08-16: qty 1

## 2013-08-16 MED ORDER — LIDOCAINE HCL 4 % MT SOLN
OROMUCOSAL | Status: DC | PRN
  Administered 2013-08-16: 4 mL via TOPICAL

## 2013-08-16 MED ORDER — PROPOFOL 10 MG/ML IV BOLUS
INTRAVENOUS | Status: DC | PRN
  Administered 2013-08-16: 180 mg via INTRAVENOUS

## 2013-08-16 MED ORDER — 0.9 % SODIUM CHLORIDE (POUR BTL) OPTIME
TOPICAL | Status: DC | PRN
  Administered 2013-08-16: 1000 mL

## 2013-08-16 MED ORDER — MORPHINE SULFATE 2 MG/ML IJ SOLN
2.0000 mg | INTRAMUSCULAR | Status: DC | PRN
  Administered 2013-08-16: 2 mg via INTRAVENOUS

## 2013-08-16 MED ORDER — IOHEXOL 300 MG/ML  SOLN
INTRAMUSCULAR | Status: DC | PRN
Start: 2013-08-16 — End: 2013-08-16
  Administered 2013-08-16: 10 mL via INTRAVENOUS

## 2013-08-16 MED ORDER — CEFAZOLIN SODIUM-DEXTROSE 2-3 GM-% IV SOLR
INTRAVENOUS | Status: AC
  Filled 2013-08-16: qty 50

## 2013-08-16 MED ORDER — ONDANSETRON HCL 4 MG/2ML IJ SOLN: 4.0000 mg | Freq: Once | INTRAMUSCULAR | Status: AC | PRN

## 2013-08-16 MED ORDER — FENTANYL CITRATE 0.05 MG/ML IJ SOLN
INTRAMUSCULAR | Status: AC
  Filled 2013-08-16: qty 5

## 2013-08-16 MED ORDER — GLYCOPYRROLATE 0.2 MG/ML IJ SOLN
INTRAMUSCULAR | Status: AC
  Filled 2013-08-16: qty 2

## 2013-08-16 MED ORDER — ALUM & MAG HYDROXIDE-SIMETH 200-200-20 MG/5ML PO SUSP: 15.0000 mL | ORAL | Status: DC | PRN

## 2013-08-16 MED ORDER — OXYCODONE HCL 5 MG PO TABS
5.0000 mg | ORAL_TABLET | Freq: Once | ORAL | Status: AC
  Administered 2013-08-16: 5 mg via ORAL

## 2013-08-16 MED ORDER — ONDANSETRON HCL 4 MG/2ML IJ SOLN: 4.0000 mg | Freq: Once | INTRAMUSCULAR | Status: DC | PRN

## 2013-08-16 MED ORDER — HYDRALAZINE HCL 20 MG/ML IJ SOLN: 10.0000 mg | INTRAMUSCULAR | Status: DC | PRN

## 2013-08-16 MED ORDER — POTASSIUM CHLORIDE CRYS ER 20 MEQ PO TBCR: 20.0000 meq | EXTENDED_RELEASE_TABLET | Freq: Every day | ORAL | Status: DC | PRN

## 2013-08-16 MED ORDER — HYDROMORPHONE HCL PF 1 MG/ML IJ SOLN: 0.2500 mg | INTRAMUSCULAR | Status: DC | PRN

## 2013-08-16 MED ORDER — GUAIFENESIN-DM 100-10 MG/5ML PO SYRP: 15.0000 mL | ORAL_SOLUTION | ORAL | Status: DC | PRN

## 2013-08-16 MED ORDER — HEPARIN (PORCINE) IN NACL 100-0.45 UNIT/ML-% IJ SOLN
500.0000 [IU]/h | INTRAMUSCULAR | Status: DC
Start: 2013-08-16 — End: 2013-08-17
  Administered 2013-08-16: 500 [IU]/h via INTRAVENOUS
  Filled 2013-08-16: qty 250

## 2013-08-16 MED ORDER — PROPOFOL 10 MG/ML IV BOLUS
INTRAVENOUS | Status: AC
  Filled 2013-08-16: qty 20

## 2013-08-16 MED ORDER — LIDOCAINE HCL (CARDIAC) 20 MG/ML IV SOLN
INTRAVENOUS | Status: DC | PRN
  Administered 2013-08-16: 30 mg via INTRAVENOUS

## 2013-08-16 MED ORDER — ONDANSETRON HCL 4 MG/2ML IJ SOLN
INTRAMUSCULAR | Status: AC
  Filled 2013-08-16: qty 2

## 2013-08-16 MED ORDER — FENTANYL CITRATE 0.05 MG/ML IJ SOLN
INTRAMUSCULAR | Status: DC | PRN
  Administered 2013-08-16 (×4): 50 ug via INTRAVENOUS
  Administered 2013-08-16: 100 ug via INTRAVENOUS
  Administered 2013-08-16 (×3): 50 ug via INTRAVENOUS
  Administered 2013-08-16: 100 ug via INTRAVENOUS
  Administered 2013-08-16: 50 ug via INTRAVENOUS

## 2013-08-16 MED ORDER — HEPARIN (PORCINE) IN NACL 2-0.9 UNIT/ML-% IJ SOLN
INTRAMUSCULAR | Status: AC
  Filled 2013-08-16: qty 1000

## 2013-08-16 MED ORDER — PHENOL 1.4 % MT LIQD
1.0000 | OROMUCOSAL | Status: DC | PRN
  Filled 2013-08-16: qty 177

## 2013-08-16 MED ORDER — HEPARIN BOLUS VIA INFUSION
2000.0000 [IU] | Freq: Once | INTRAVENOUS | Status: AC
  Administered 2013-08-16: 2000 [IU] via INTRAVENOUS
  Filled 2013-08-16: qty 2000

## 2013-08-16 MED ORDER — HEPARIN SODIUM (PORCINE) 1000 UNIT/ML IJ SOLN
INTRAMUSCULAR | Status: DC | PRN
  Administered 2013-08-16: 7000 [IU] via INTRAVENOUS
  Administered 2013-08-16 (×2): 2000 [IU] via INTRAVENOUS

## 2013-08-16 MED ORDER — NEOSTIGMINE METHYLSULFATE 10 MG/10ML IV SOLN
INTRAVENOUS | Status: AC
  Filled 2013-08-16: qty 1

## 2013-08-16 MED ORDER — SODIUM CHLORIDE 0.9 % IV SOLN: 1.0000 mL/kg/h | INTRAVENOUS | Status: DC

## 2013-08-16 MED ORDER — LIDOCAINE HCL (CARDIAC) 20 MG/ML IV SOLN
INTRAVENOUS | Status: AC
Start: 2013-08-16 — End: 2013-08-16
  Filled 2013-08-16: qty 5

## 2013-08-16 MED ORDER — MORPHINE SULFATE 2 MG/ML IJ SOLN
2.0000 mg | INTRAMUSCULAR | Status: DC | PRN
  Administered 2013-08-16 – 2013-08-17 (×2): 2 mg via INTRAVENOUS
  Filled 2013-08-16 (×2): qty 1

## 2013-08-16 MED ORDER — ONDANSETRON HCL 4 MG/2ML IJ SOLN
INTRAMUSCULAR | Status: DC | PRN
  Administered 2013-08-16: 4 mg via INTRAVENOUS

## 2013-08-16 MED ORDER — NEOSTIGMINE METHYLSULFATE 10 MG/10ML IV SOLN
INTRAVENOUS | Status: DC | PRN
  Administered 2013-08-16: 3 mg via INTRAVENOUS

## 2013-08-16 MED ORDER — HYDROMORPHONE HCL PF 1 MG/ML IJ SOLN
INTRAMUSCULAR | Status: AC
  Filled 2013-08-16: qty 2

## 2013-08-16 MED ORDER — PHENYLEPHRINE HCL 10 MG/ML IJ SOLN
INTRAMUSCULAR | Status: DC | PRN
  Administered 2013-08-16 (×2): 80 ug via INTRAVENOUS

## 2013-08-16 MED ORDER — SODIUM CHLORIDE 0.9 % IV SOLN: 500.0000 mL | Freq: Once | INTRAVENOUS | Status: AC | PRN

## 2013-08-16 MED ORDER — HEPARIN SODIUM (PORCINE) 1000 UNIT/ML IJ SOLN
INTRAMUSCULAR | Status: AC
  Filled 2013-08-16: qty 1

## 2013-08-16 MED ORDER — PANTOPRAZOLE SODIUM 40 MG PO TBEC
40.0000 mg | DELAYED_RELEASE_TABLET | Freq: Every day | ORAL | Status: DC
  Administered 2013-08-17 – 2013-08-20 (×4): 40 mg via ORAL
  Filled 2013-08-16 (×4): qty 1

## 2013-08-16 MED ORDER — HYDROMORPHONE HCL PF 1 MG/ML IJ SOLN
0.5000 mg | INTRAMUSCULAR | Status: DC | PRN
  Administered 2013-08-16 (×3): 0.5 mg via INTRAVENOUS

## 2013-08-16 MED ORDER — DOPAMINE-DEXTROSE 3.2-5 MG/ML-% IV SOLN
3.0000 ug/kg/min | INTRAVENOUS | Status: DC
  Filled 2013-08-16: qty 250

## 2013-08-16 MED ORDER — OXYCODONE HCL 5 MG PO TABS
ORAL_TABLET | ORAL | Status: AC
  Filled 2013-08-16: qty 1

## 2013-08-16 MED ORDER — METOPROLOL TARTRATE 1 MG/ML IV SOLN
2.0000 mg | INTRAVENOUS | Status: DC | PRN
Start: 2013-08-16 — End: 2013-08-17

## 2013-08-16 MED ORDER — SODIUM CHLORIDE 0.9 % IR SOLN
Status: DC | PRN
  Administered 2013-08-16: 17:00:00

## 2013-08-16 MED ORDER — LABETALOL HCL 5 MG/ML IV SOLN
10.0000 mg | INTRAVENOUS | Status: DC | PRN
  Filled 2013-08-16: qty 4

## 2013-08-16 MED ORDER — LIDOCAINE HCL (PF) 1 % IJ SOLN
INTRAMUSCULAR | Status: AC
  Filled 2013-08-16: qty 30

## 2013-08-16 MED ORDER — MIDAZOLAM HCL 5 MG/5ML IJ SOLN
INTRAMUSCULAR | Status: DC | PRN
  Administered 2013-08-16: 2 mg via INTRAVENOUS

## 2013-08-16 MED ORDER — MORPHINE SULFATE 2 MG/ML IJ SOLN
INTRAMUSCULAR | Status: AC
  Filled 2013-08-16: qty 1

## 2013-08-16 MED ORDER — GLYCOPYRROLATE 0.2 MG/ML IJ SOLN
INTRAMUSCULAR | Status: DC | PRN
  Administered 2013-08-16: 0.4 mg via INTRAVENOUS

## 2013-08-16 MED ORDER — THROMBIN 20000 UNITS EX SOLR
CUTANEOUS | Status: DC | PRN
  Administered 2013-08-16: 19:00:00 via TOPICAL

## 2013-08-16 SURGICAL SUPPLY — 54 items
ADH SKN CLS APL DERMABOND .7 (GAUZE/BANDAGES/DRESSINGS) ×1
ARMBAND PINK RESTRICT EXTREMIT (MISCELLANEOUS) ×3 IMPLANT
BAG SNAP BAND KOVER 36X36 (MISCELLANEOUS) ×2 IMPLANT
BLADE 10 SAFETY STRL DISP (BLADE) ×3 IMPLANT
CANISTER SUCTION 2500CC (MISCELLANEOUS) ×3 IMPLANT
CATH EMB 3FR 80CM (CATHETERS) ×2 IMPLANT
CATH EMB 4FR 80CM (CATHETERS) ×3 IMPLANT
CLIP TI MEDIUM 6 (CLIP) ×3 IMPLANT
CLIP TI WIDE RED SMALL 6 (CLIP) ×3 IMPLANT
COVER DOME SNAP 22 D (MISCELLANEOUS) ×2 IMPLANT
COVER SURGICAL LIGHT HANDLE (MISCELLANEOUS) ×3 IMPLANT
DERMABOND ADVANCED (GAUZE/BANDAGES/DRESSINGS) ×2
DERMABOND ADVANCED .7 DNX12 (GAUZE/BANDAGES/DRESSINGS) ×1 IMPLANT
DRSG COVADERM 4X8 (GAUZE/BANDAGES/DRESSINGS) ×2 IMPLANT
ELECT REM PT RETURN 9FT ADLT (ELECTROSURGICAL) ×3
ELECTRODE REM PT RTRN 9FT ADLT (ELECTROSURGICAL) ×1 IMPLANT
GEL ULTRASOUND 20GR AQUASONIC (MISCELLANEOUS) IMPLANT
GLOVE BIO SURGEON STRL SZ7 (GLOVE) ×7 IMPLANT
GLOVE BIOGEL M 6.5 STRL (GLOVE) ×4 IMPLANT
GLOVE BIOGEL PI IND STRL 6.5 (GLOVE) IMPLANT
GLOVE BIOGEL PI IND STRL 7.5 (GLOVE) ×1 IMPLANT
GLOVE BIOGEL PI INDICATOR 6.5 (GLOVE) ×4
GLOVE BIOGEL PI INDICATOR 7.5 (GLOVE) ×10
GLOVE ECLIPSE 7.5 STRL STRAW (GLOVE) ×2 IMPLANT
GLOVE SS BIOGEL STRL SZ 7 (GLOVE) IMPLANT
GLOVE SUPERSENSE BIOGEL SZ 7 (GLOVE) ×4
GOWN STRL REUS W/ TWL LRG LVL3 (GOWN DISPOSABLE) ×3 IMPLANT
GOWN STRL REUS W/ TWL XL LVL3 (GOWN DISPOSABLE) IMPLANT
GOWN STRL REUS W/TWL LRG LVL3 (GOWN DISPOSABLE) ×21
GOWN STRL REUS W/TWL XL LVL3 (GOWN DISPOSABLE) ×15
KIT BASIN OR (CUSTOM PROCEDURE TRAY) ×3 IMPLANT
KIT ROOM TURNOVER OR (KITS) ×3 IMPLANT
NS IRRIG 1000ML POUR BTL (IV SOLUTION) ×3 IMPLANT
PACK CV ACCESS (CUSTOM PROCEDURE TRAY) ×1 IMPLANT
PACK PERIPHERAL VASCULAR (CUSTOM PROCEDURE TRAY) ×2 IMPLANT
PAD ARMBOARD 7.5X6 YLW CONV (MISCELLANEOUS) ×6 IMPLANT
PATCH VASCULAR VASCU GUARD 1X6 (Vascular Products) ×4 IMPLANT
SET MICROPUNCTURE 5F STIFF (MISCELLANEOUS) ×2 IMPLANT
SPONGE SURGIFOAM ABS GEL 100 (HEMOSTASIS) IMPLANT
STAPLER VISISTAT (STAPLE) ×2 IMPLANT
STOPCOCK 4 WAY LG BORE MALE ST (IV SETS) ×2 IMPLANT
SUT MNCRL AB 4-0 PS2 18 (SUTURE) ×3 IMPLANT
SUT PROLENE 6 0 BV (SUTURE) ×13 IMPLANT
SUT PROLENE 7 0 BV1 MDA (SUTURE) ×2 IMPLANT
SUT VIC AB 3-0 SH 27 (SUTURE) ×3
SUT VIC AB 3-0 SH 27X BRD (SUTURE) ×1 IMPLANT
SYR 3ML LL SCALE MARK (SYRINGE) ×2 IMPLANT
SYR TB 1ML LUER SLIP (SYRINGE) ×2 IMPLANT
TOWEL OR 17X24 6PK STRL BLUE (TOWEL DISPOSABLE) ×3 IMPLANT
TOWEL OR 17X26 10 PK STRL BLUE (TOWEL DISPOSABLE) ×9 IMPLANT
TRAY FOLEY CATH 14FRSI W/METER (CATHETERS) ×2 IMPLANT
TUBING EXTENTION W/L.L. (IV SETS) ×2 IMPLANT
UNDERPAD 30X30 INCONTINENT (UNDERPADS AND DIAPERS) ×3 IMPLANT
WATER STERILE IRR 1000ML POUR (IV SOLUTION) ×3 IMPLANT

## 2013-08-16 NOTE — Op Note (Signed)
OPERATIVE NOTE   PROCEDURE: 1.  Right common femoral artery cannulation under ultrasound guidance 2.  Placement of catheter in aorta 3.  Aortogram 4.  Second order arterial selection 5.  Left leg runoff 6.  Stenting and subintimal angioplasty of left superficial femoral artery x 2 (Viabahn 5 mm x 100 mm, 5 mm x 50 mm)  PRE-OPERATIVE DIAGNOSIS: left foot ischemia  POST-OPERATIVE DIAGNOSIS: same as above   SURGEON: Adele Barthel, MD  ANESTHESIA: conscious sedation  ESTIMATED BLOOD LOSS: 50 cc  CONTRAST: 140 cc  FINDING(S):  Aorta: widely patent  Right Left  RA patent patent  CIA patent patent  EIA patent patent  IIA patent patent  CFA patent patent  SFA  Patent proximally, occluded for 6 cm in mid-segment: resolved after stenting and angioplasty  PFA  patent  Pop  patent  Trif  patent  AT  Patent with poor blood flow with diffuse disease: improved after stenting SFA  Pero  Patent: patent if SFA stenting  PT  Patent down to foot initially: occluded after SFA stenting   SPECIMEN(S):  none  INDICATIONS:   Toni Logan is a 43 y.o. female who presents with left foot ischemia.  The patient presents for: aortogram, bilateral leg runoff and possible intervention.  I discussed with the patient the nature of angiographic procedures, especially the limited patencies of any endovascular intervention.  The patient is aware of that the risks of an angiographic procedure include but are not limited to: bleeding, infection, access site complications, renal failure, embolization, rupture of vessel, dissection, possible need for emergent surgical intervention, possible need for surgical procedures to treat the patient's pathology, and stroke and death.  The patient is aware of the risks and agrees to proceed.  DESCRIPTION: After full informed consent was obtained from the patient, the patient was brought back to the angiography suite.  The patient was placed supine upon the angiography  table and connected to monitoring equipment.  The patient was then given conscious sedation, the amounts of which are documented in the patient's chart.  The patient was prepped and drape in the standard fashion for an angiographic procedure.  At this point, attention was turned to the right groin.  Under ultrasound guidance, the right common femoral artery was cannulated with a micropuncture needle.  The microwire was advanced into the iliac arterial system.  The needle was exchanged for a microsheath, which was loaded into the common femoral artery over the wire.  The microwire was exchanged for a [facility] wire which was advanced into the aorta.  The microsheath was then exchanged for a 5-Fr sheath which was loaded into the common femoral artery.  The Omniflush catheter was then loaded over the wire up to the level of L1.  The catheter was connected to the power injector circuit.  After de-airring and de-clotting the circuit, a power injector aortogram was completed.  The [facility] wire was replaced in the catheter, and using the [place] and Omniflush catheter, the left common iliac artery was selected.  The catheter and wire were advanced into the external iliac artery.  The wire was removed and the catheter connected to the power injector circuit.  An automated left leg runoff was completed.  Based on the injection, I felt revascularization was possible and indicated given the ischemia in the left foot.  The wire was exchanged for a Rosen wire.  The patient's right femoral sheath was exchanged for a 6-Fr Destination sheath, which was lodged in  the left superficial femoral artery.  The dilator was removed.  The patient was given 7000 units of Heparin intravenously, which was a therapeutic bolus.  Using a Glidewire and Quickcross, I was able to traverse a chronic total occlusion in the right superficial femoral artery with a subintimal technique.  The wire behavior was not consistent with an acute clot.  I verified  re-entry into the above-the-knee popliteal artery.  Based on the presence of some arterial calcification distal to the re-entry, I felt a longer balloon was going to be necessary, so I selected a 4 mm x 100 mm balloon.  The balloon was placed to treat the entire subintimal segment and 2 cm distal to the re-entry site.  I inflated this balloon first to profile at 8 atm for 2 minutes.  Hand injection demonstrated some optimal outcome.  I replaced the balloon and inflated it to 12 atm for 2 minutes.  Again there residual stenosis in the subintimal plane, so I elected to stent this segment with a 5 mm x 100 mm Viabahn stent.  I was placed to overlap the previously angioplasties segment.  I deployed the stent and deployed it fully with the prior angioplasty balloon.  On completion images, there appeared to be either thrombus or a dissection distal to the stent, so I obtained a 5 mm x 50 mm Viabahn stent and deployed it overlapping the dissection or thrombus.  Completion imaging demonstrated greatly improved superficial femoral artery blood flow, as the superficial femoral artery flow now beats the profunda femoral artery collateral flow.  On the trifurcation injection, however, there was a new tibioperoneal trunk thrombus evident.  The anterior tibial artery blood flow was improved and there continued to be peroneal blood flow reconstituting a posterior tibial artery.    In my opinion, this is best treat with surgical thrombectomy, so at this point I stopped the case.  I replaced the dilator and wire in the sheath.  I pulled the sheath into the right common iliac artery and readvanced the wire.  The sheath was exchanged for a 6-Fr sheath.  I removed the wire.  The sheath was aspirated.  No clots were present and the sheath was reloaded with heparinized saline.    COMPLICATIONS: left tibioperoneal trunk thomboembolism  CONDITION: stable  Adele Barthel, MD [facility] [place] Office:  [phone] Pager: [phone]  08/16/2013, 10:54 AM

## 2013-08-16 NOTE — H&P (View-Only) (Signed)
[facility] [place]  Reason for Consult:  Ischemic toes Referring Physician:  ER MRN #:  [id number]  History of Present Illness: This is a 43 y.o. female who presents to the ER today with complaints of severe pain in the toes of her left foot.  She states that this started 5-8 days ago.  She states that she cannot move her toes on her left foot.  She states that she has had pain in both calves as well.  She states that she has not been able to walk more than a block due to pain in her legs.  She states that she has also had chest pain, which she had a cardiac catheterization in 2010, but does not know if a stent was placed or if she had angioplasty.  She does state that at that time, she did have blockage in one artery.  This was done at [facility].  She states she continues to have occasional chest pain and it seems more severe than it was in 2010.    She does smoke 1-1.5 ppd of cigarettes.  She has a hx of HTN, DM, and hypercholesterolemia, but is not on any medications for these.  Past Medical History  Diagnosis Date  . Hypertension   . Diabetes mellitus without complication   . Hypercholesterolemia    Past Surgical History  Procedure Laterality Date  . Carpal tunnel release      Allergies  Allergen Reactions  . Fish Allergy   . Ibuprofen     Prior to Admission medications   Medication Sig Start Date End Date Taking? Authorizing Provider  Oxycodone HCl 10 MG TABS Take 10 mg by mouth 3 (three) times daily.   Yes Historical Provider, MD    History   Social History  . Marital Status: Single    Spouse Name: N/A    Number of Children: N/A  . Years of Education: N/A   Occupational History  . Not on file.   Social History Main Topics  . Smoking status: Current Every Day Smoker  . Smokeless tobacco: Not on file  . Alcohol Use: No  . Drug Use: No  . Sexual Activity: Not on file   Other Topics Concern  . Not on file   Social History Narrative   . No narrative on file     ROS: [x]  Positive   [ ]  Negative   [ ]  All sytems reviewed and are negative  Cardiovascular: [x]  chest pain/pressure-history of cardiac cath in  []  palpitations []  SOB lying flat []  DOE [x]  pain in legs while walking [x]  pain in legs at rest [x]  pain in legs at night []  non-healing ulcers []  hx of DVT []  swelling in legs  Pulmonary: []  productive cough []  asthma/wheezing []  home O2  Neurologic: []  weakness in []  arms []  legs []  numbness in []  arms []  legs []  hx of CVA []  mini stroke [] difficulty speaking or slurred speech []  temporary loss of vision in one eye []  dizziness  Hematologic: []  hx of cancer []  bleeding problems []  problems with blood clotting easily  Endocrine:   [x]  diabetes []  thyroid disease  GI []  vomiting blood []  blood in stool  GU: []  CKD/renal failure []  HD--[]  M/W/F or []  T/T/S []  burning with urination []  blood in urine  Psychiatric: []  anxiety []  depression  Musculoskeletal: []  arthritis []  joint pain  Integumentary: []  rashes []  ulcers  Constitutional: []  fever []  chills   Physical Examination  Filed Vitals:   08/15/13 1300  BP: 142/56  Pulse: 77  Temp:   Resp:    There is no weight on file to calculate BMI.  General:  WDWN in NAD Gait: Not observed HENT: WNL, normocephalic Eyes: Pupils equal Pulmonary: normal non-labored breathing, without Rales, rhonchi,  wheezing Cardiac: regular, without  Murmurs, rubs or gallops; without carotid bruits Abdomen: soft, NT/ND, no masses Skin: with erythema to dorsum of left foot, without ulcers  Vascular Exam/Pulses:  Right Left  Radial 2+ (normal) 2+ (normal)  Ulnar 1+ (weak) Non palpable  Femoral 3+ (normal) 3+ (normal)  Popliteal 2+ (normal) Non palpable  DP 2+ (normal) Non palpable  PT 1+ (weak) Non palpable   Extremities: with ischemic changes to the 1st, 4th and 5th left toes, without Gangrene , with cellulitis of dorsum of left  foot; without open wounds; + monophasic signal of the left PT/DP; + biphasic signal of the right DP/PT; diminished dorsiflexion of left foot.  Calves are non tender bilaterally Musculoskeletal: no muscle wasting or atrophy  Neurologic: A&O X 3; Appropriate Affect ; SENSATION: normal; MOTOR FUNCTION:  moving all extremities equally. Speech is fluent/normal Psychiatric:  Mildly distressed but appropriate  Lymph:  No inguinal lymphadenopathy   CBC    Component Value Date/Time   WBC 9.6 08/15/2013 1233   RBC 4.88 08/15/2013 1233   HGB 14.6 08/15/2013 1233   HCT 42.6 08/15/2013 1233   PLT 200 08/15/2013 1233   MCV 87.3 08/15/2013 1233   MCH 29.9 08/15/2013 1233   MCHC 34.3 08/15/2013 1233   RDW 14.0 08/15/2013 1233   LYMPHSABS 2.5 08/15/2013 1233   MONOABS 0.6 08/15/2013 1233   EOSABS 0.1 08/15/2013 1233   BASOSABS 0.0 08/15/2013 1233    BMET    Component Value Date/Time   NA 131* 08/15/2013 1233   K 3.7 08/15/2013 1233   CL 98 08/15/2013 1233   CO2 20 08/15/2013 1233   GLUCOSE 181* 08/15/2013 1233   BUN 8 08/15/2013 1233   CREATININE 0.44* 08/15/2013 1233   CALCIUM 8.8 08/15/2013 1233   GFRNONAA >90 08/15/2013 1233   GFRAA >90 08/15/2013 1233    COAGS: Lab Results  Component Value Date   INR 0.87 08/15/2013     Non-Invasive Vascular Imaging:   ABI's at outside facility 08/14/13: Right:  1.1 Left:  0.4  Statin:  no Beta Blocker:  no Aspirin:  no ACEI:  no ARB:  no Other antiplatelets/anticoagulants:  no   ASSESSMENT: This is a 43 y.o. female with ischemic toes of the left foot with severe pain that started ~ 5-8 days ago.  Has most likely embolized to toes of the left foot from an SFA lesion.  Will start heparin and have aortogram tomorrow to see if lesion is amendable to treatment in the [facility].    PLAN: -admit to hospital by hospitalist given her multiple medical problems -will start heparin and d/c on call to the [facility] -will schedule for aortogram with bilateral lower extremity runoff and possible  intervention by Dr. Bridgett Larsson tomorrow -labs in am -NPO after MN   Leontine Locket, PA-C [facility] [phone]  Agree with above assessment Patient with likely superficial femoral occlusive disease and embolus to toes of left foot. She does have chronic claudication symptoms. She does have diabetes mellitus and heavy history of tobacco abuse  Patient also has had cardiac catheterization but does not think she has had angioplasty or stent placed-not on regular followup Will treat  patient with IV heparin and perform angiography tomorrow per Dr.Chen to see if there is left leg lesion amenable to percutaneous intervention. We'll then need to have left foot follow to see if fourth and fifth toes heal  or if toe amputations eventually become indicated and necessary. Discussed this with patient and she is in agreement Patient to be admitted to medical service because of cardiac issues-history of intermittent chest discomfort but no history of myocardial infarction

## 2013-08-16 NOTE — Progress Notes (Signed)
[facility] PROGRESS NOTE  Toni Logan DXA:[id number] DOB: 1970/08/29 DOA: 08/15/2013 PCP: LAND, PHILLIP, PA-C  Assessment/Plan  Ischemic left toes due to arterial embolism of the left leg with possible overlying cellulitis or superficial thrombophlebitis  - Agree with antibiotics  - Heparin/antiplatelets per vascular surgery - Aortogram with left leg run off, stenting and subintimal angioplasty of left superficial femoral artery x 2 (Viabahn 5 mm x 100 mm, 5 mm x 50 mm) >> patient still in OR - Dilaudid prn pain   Intermittent chest pains, atypical. Does have risk factors for heart disease, however, and ECG is abnormal. Attempted to get records from [facility]  - ECHO pending - Perioperative risk of CV between 0.42% and 2.4%   HTN/HLD, blood pressures elevated but likely due to agitation and pain and were low normal this morning - Control pain  - Lipid panel, not at goal - Continue atorvastatin   DM, hyperglycemic in ER, A1c7.9 - Continue low dose SSI  - Diabetic diet  - Consider metformin +/- SU at discharge  Hyponatremia, likely secondary to dehydration and resolving - Continue IVF  Tobacco abuse  - Patient declines patch b/c it causes rash  - Start gum prn  Diet: NPO for procedure  Access: PIV  IVF: yes  Proph: Heparin gtt   Code Status: full code    Consultants:  Dr. Bridgett Larsson, vascular surgery  Procedures: Aortogram with left leg run off and stenting and subintimal angioplasty of left superficial femoral artery x 2 (Viabahn 5 mm x 100 mm, 5 mm x 50 mm)  Antibiotics:  Cefazolin 5/6 >>   HPI/Subjective:  Patient in OR all day, will have NP check on patient.  Objective: Filed Vitals:   08/16/13 0511 08/16/13 0837 08/16/13 1239 08/16/13 1424  BP: 100/54  116/60 117/72  Pulse: 72 68 84 72  Temp: 98.4 F (36.9 C)  98.1 F (36.7 C) 98.6 F (37 C)  TempSrc: Oral  Oral Oral  Resp: 16  19 18   Height:      Weight: 80.74 kg (178 lb)     SpO2: 94%  98%  95%    Intake/Output Summary (Last 24 hours) at 08/16/13 1530 Last data filed at 08/16/13 0639  Gross per 24 hour  Intake 1411.5 ml  Output      0 ml  Net 1411.5 ml   Filed Weights   08/16/13 0100 08/16/13 0511  Weight: 81 kg (178 lb 9.2 oz) 80.74 kg (178 lb)    Exam:   Patient not seen >> in OR all day.  Will have NP check on patient post-op  Data Reviewed: Basic Metabolic Panel:  Recent Labs Lab 08/15/13 1233 08/16/13 0528  NA 131* 135*  K 3.7 3.9  CL 98 101  CO2 20 22  GLUCOSE 181* 157*  BUN 8 7  CREATININE 0.44* 0.46*  CALCIUM 8.8 8.5   Liver Function Tests: No results found for this basename: AST, ALT, ALKPHOS, BILITOT, PROT, ALBUMIN,  in the last 168 hours No results found for this basename: LIPASE, AMYLASE,  in the last 168 hours No results found for this basename: AMMONIA,  in the last 168 hours CBC:  Recent Labs Lab 08/15/13 1233 08/16/13 0528  WBC 9.6 7.4  NEUTROABS 6.3  --   HGB 14.6 13.8  HCT 42.6 40.2  MCV 87.3 87.6  PLT 200 201   Cardiac Enzymes:  Recent Labs Lab 08/15/13 1233  CKTOTAL 62   BNP (last 3 results) No results  found for this basename: PROBNP,  in the last 8760 hours CBG:  Recent Labs Lab 08/15/13 2107 08/16/13 0755 08/16/13 1017  GLUCAP 263* 136* 131*    No results found for this or any previous visit (from the past 240 hour(s)).   Studies: No results found.  Scheduled Meds: . [facility] HOLD] atorvastatin  80 mg Oral q1800  . [MAR HOLD]  ceFAZolin (ANCEF) IV  1 g Intravenous 3 times per day  . [facility] HOLD] docusate sodium  100 mg Oral BID  . [MAR HOLD] insulin aspart  0-5 Units Subcutaneous QHS  . [MAR HOLD] insulin aspart  0-9 Units Subcutaneous TID WC  . [MAR HOLD] senna  1 tablet Oral BID  . [facility] HOLD] sodium chloride  3 mL Intravenous Q12H   Continuous Infusions: . sodium chloride 75 mL/hr at 08/16/13 0358  . heparin 1,450 Units/hr (08/16/13 5176)    Active Problems:   Arterial embolism of left leg    Essential hypertension, benign   Hyperlipidemia   Diabetes mellitus with peripheral vascular disease   Tobacco abuse   Cellulitis of left foot    Time spent: 30 min    [facility] Pager [phone]. If 7PM-7AM, please contact night-coverage at [email or web address], password [facility] 08/16/2013, 3:30 PM  LOS: 1 day

## 2013-08-16 NOTE — Transfer of Care (Signed)
Immediate Anesthesia Transfer of Care Note  Patient: Toni Logan  Procedure(s) Performed: Procedure(s): Left below knee popliteal and peroneal thrombectomy; tibial and peroneal endaterectomy with patch angioplasty (Left)  Patient Location: PACU  Anesthesia Type:General  Level of Consciousness: awake, sedated and patient cooperative  Airway & Oxygen Therapy: Patient Spontanous Breathing and Patient connected to nasal cannula oxygen  Post-op Assessment: Report given to PACU RN, Post -op Vital signs reviewed and stable and Patient moving all extremities X 4  Post vital signs: Reviewed and stable  Complications: No apparent anesthesia complications

## 2013-08-16 NOTE — Progress Notes (Signed)
Asked patient for urine specimen for urine pregnancy test and she told me that she did not want to do it because she had been with the same woman for years.  She did not want to pay for a test she knew she did not need.

## 2013-08-16 NOTE — Progress Notes (Signed)
Utilization Review Completed.Toni Laming T Dowell5/10/2013

## 2013-08-16 NOTE — Anesthesia Preprocedure Evaluation (Addendum)
Anesthesia Evaluation  Patient identified by MRN, date of birth, ID band Patient awake    Reviewed: Allergy & Precautions, H&P , NPO status , Patient's Chart, lab work & pertinent test results  History of Anesthesia Complications Negative for: history of anesthetic complications  Airway Mallampati: I TM Distance: >3 FB     Dental no notable dental hx. (+) Teeth Intact, Dental Advisory Given   Pulmonary Current Smoker,  breath sounds clear to auscultation  Pulmonary exam normal       Cardiovascular hypertension, + Peripheral Vascular Disease Rhythm:Regular Rate:Normal     Neuro/Psych    GI/Hepatic   Endo/Other  diabetes  Renal/GU      Musculoskeletal   Abdominal   Peds  Hematology   Anesthesia Other Findings   Reproductive/Obstetrics                          Anesthesia Physical Anesthesia Plan  ASA: II  Anesthesia Plan: General   Post-op Pain Management:    Induction: Intravenous  Airway Management Planned: Oral ETT  Additional Equipment:   Intra-op Plan:   Post-operative Plan: Extubation in OR  Informed Consent: I have reviewed the patients History and Physical, chart, labs and discussed the procedure including the risks, benefits and alternatives for the proposed anesthesia with the patient or authorized representative who has indicated his/her understanding and acceptance.   Dental advisory given  Plan Discussed with: CRNA, Anesthesiologist and Surgeon  Anesthesia Plan Comments:         Anesthesia Quick Evaluation

## 2013-08-16 NOTE — Progress Notes (Signed)
ANTICOAGULATION CONSULT NOTE - Initial Consult  Pharmacy Consult for heparin Indication: Arterial embolism LLE  Allergies  Allergen Reactions  . Fish Allergy   . Ibuprofen     Patient Measurements: Height: 5' 6.93" (170 cm) Weight: 178 lb (80.74 kg) IBW/kg (Calculated) : 61.44 Heparin Dosing Weight: 80.7 kg  Vital Signs: Temp: 98.4 F (36.9 C) (05/07 0511) Temp src: Oral (05/07 0511) BP: 100/54 mmHg (05/07 0511) Pulse Rate: 72 (05/07 0511)  Labs:  Recent Labs  08/15/13 1233 08/16/13 0528  HGB 14.6 13.8  HCT 42.6 40.2  PLT 200 201  APTT 29  --   LABPROT 11.7  --   INR 0.87  --   HEPARINUNFRC  --  <0.10*  CREATININE 0.44*  --   CKTOTAL 62  --     Estimated Creatinine Clearance: 98.9 ml/min (by C-G formula based on Cr of 0.44).  Assessment: 43 yo female with LLE arterial embolism for heparin  Goal of Therapy:  Heparin level 0.3-0.7 units/ml Monitor platelets by anticoagulation protocol: Yes   Plan:  Heparin 2000 units IV bolus, then increase heparin 1450 units/hr Check heparin level in 6 hours.   Toni Logan 08/16/2013,6:31 AM

## 2013-08-16 NOTE — Anesthesia Procedure Notes (Signed)
Procedure Name: Intubation Date/Time: 08/16/2013 4:24 PM Performed by: Izora Gala Pre-anesthesia Checklist: Patient identified, Emergency Drugs available, Suction available and Patient being monitored Patient Re-evaluated:Patient Re-evaluated prior to inductionOxygen Delivery Method: Circle system utilized Preoxygenation: Pre-oxygenation with 100% oxygen Intubation Type: IV induction Ventilation: Mask ventilation without difficulty Laryngoscope Size: Miller and 3 Grade View: Grade I Tube type: Oral Tube size: 7.5 mm Number of attempts: 1 Airway Equipment and Method: Stylet Placement Confirmation: ETT inserted through vocal cords under direct vision,  positive ETCO2 and breath sounds checked- equal and bilateral Secured at: 22 cm Tube secured with: Tape Dental Injury: Teeth and Oropharynx as per pre-operative assessment

## 2013-08-16 NOTE — Anesthesia Postprocedure Evaluation (Signed)
  Anesthesia Post-op Note  Patient: Toni Logan  Procedure(s) Performed: Procedure(s): Left below knee popliteal and peroneal thrombectomy; tibial and peroneal endaterectomy with patch angioplasty (Left)  Patient Location: PACU  Anesthesia Type:General  Level of Consciousness: awake, oriented, sedated and patient cooperative  Airway and Oxygen Therapy: Patient Spontanous Breathing  Post-op Pain: mild  Post-op Assessment: Post-op Vital signs reviewed, Patient's Cardiovascular Status Stable, Respiratory Function Stable, Patent Airway, No signs of Nausea or vomiting and Adequate PO intake  Post-op Vital Signs: stable  Last Vitals:  Filed Vitals:   08/16/13 2145  BP: 140/59  Pulse: 71  Temp:   Resp: 17    Complications: No apparent anesthesia complications

## 2013-08-16 NOTE — Op Note (Addendum)
OPERATIVE NOTE   PROCEDURE: 1. Left below-the-knee popliteal artery thrombectomy, endarterectomy and bovine patch angioplasty 2. Left tibioperoneal trunk thrombectomy, endarterectomy and bovine patching angioplasty 3. Left proximal anterior tibial artery endarterectomy 4. Left leg runoff  PRE-OPERATIVE DIAGNOSIS: left tibioperoneal trunk thromboembolism  POST-OPERATIVE DIAGNOSIS: same as above   SURGEON: Adele Barthel, MD  ASSISTANT(S): Gerri Lins, PAC   ANESTHESIA: general  ESTIMATED BLOOD LOSS: 300 cc  FINDING(S): 1. Minimal thrombus in tibioperoneal trunk  2. Extensive calcific disease in below-the-knee popliteal artery extending into tibioperoneal trunk which had severe occlusive disease 3. Multiphasic doppler signal in posterior tibial artery at end of case 4. Dopplerable anterior tibial artery signal with high resistive signal  SPECIMEN(S):  none  INDICATIONS:   Toni Logan is a 43 y.o. female who  presents with left foot ischemia.  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  This was recanalized with a subintimal technique and stented.  Unfortunately, her left tibioperoneal trunk occluded after successfully opening the superficial femoral artery, suggesting embolism.  I recommended a thrombectomy of the tibioperoneal trunk to optimize her runoff given her young age.  The risk, benefits, and alternative for this procedure were discussed with the patient.  The patient is aware the risks include but are not limited to: bleeding, infection, myocardial infarction, stroke, limb loss, nerve damage, need for additional procedures in the future, wound complications, and inability to complete the thrombectomy.  The patient is aware of these risks and agreed to proceed.  DESCRIPTION: After obtaining full informed written consent, the patient was brought back to the operating room and placed supine upon the  operating table.  The patient received IV antibiotics prior to induction.  After obtaining adequate anesthesia, the patient was prepped and draped in the standard fashion for: left leg thrombectomy.  I made an incision one finger width behind the tibia.  Using electrocautery, I dissected through the subcutaneous tissue and fascia.  I dissected out the medial gastrocnemius belly and retracted it posteriorly.  I then used electrocautery to take down a hypertrophied soleus muscle to gain exposure of the tibioperoneal trunk.  I dissected out the popliteal vein and reflected it up.  This gave me exposure of the below-the-knee popliteal artery with its continuation into the tibioperoneal trunk.  The anterior tibial artery takeoff was also dissected out.  I placed vessels around these arteries for control.  The patient was given 7000 units of Heparin intravenously, which was a therapeutic bolus.  In total, 11000 units of Heparin was administrated to achieve and maintain a therapeutic level of anticoagulation.  After waiting 3 minutes, I placed the popliteal and anterior tibial arteries, and distal tibioperoneal trunk under tension.  I made an arteriotomy in the distal below-the-knee popliteal artery.  I extended this into the mid tibioperoneal trunk.  There was extensive calcific atherosclerosis through the distal popliteal artery which extended into the tibioperoneal trunk.  The calcific disease in the tibioperoneal trunk was severe and near occluding.  I was able to pass a 3 Fogarty catheter distally and extract very limited amount of thrombus.  As the calcific disease was so severe I could not easily pass a stitch, I had to endarterectomize the distal popliteal artery, proximal anterior tibial artery and tibioperoneal trunk.  After extracting all non-adherent plaque, I obtained a bovine patch which was soaked appropriately.  I fashioned the patch for the geometry of this artery.  I  sewed the patch in place with two  running stitches of 6-0 Prolene.  Prior to completing the patch angioplasty, I backbled all arteries: excellent popliteal artery flow, good tibioperoneal trunk backbleeding, and continuous anterior tibial artery backbleeding.  Distally I found with a continuous doppler improved posterior tibial artery and anterior tibial artery signals but the signal were not as strong as I had anticipated so I decided to proceed with a left leg runoff.  At this point, I cannulated the bovine patched below-the-knee popliteal artery and passed a microwire up the artery.  I loaded a microsheath over the wire and removed the wire.  I connected the sheath to the IV extension tubing and performs a hand injection of the runoff.  There appeared to be severe stenosis distal to the bovine patched segment.  I suspected that this likely was residual calcific atherosclerotic disease, so further exploration was indicated as I did not think a popliteal to tibial bypass was indicated in this patient.  I placed a 6-0 stitch in the puncture hole to seal the bovine patch.  I dissected out the tibioperoneal trunk due to the level of the takeoff of the peroneal artery and posterior tibial artery.  I clamped the patched below-the-knee popliteal artery and also the peroneal and posterior tibial arteries.  I made an arteriotomy and extended it proximally and distally.  Again severe calcific atherosclerosis was present.  The stenosis found on hand injection was calcific disease.  I continued the endarterectomy distally until I found a good adherent segment that was non-calcified.  At this point, I elected to not enter into either tibial arteries given their small size.  After extracting all non-adherent plaque, I sewed a new bovine patch onto the prior patch segment and to the distal segment of tibioperoneal trunk.  Prior to completing this, I backbleed both ends and there was good pulsatile antegrade bleeding and good backbleeding.  I was able to pass a  4 mm dilator through the junction of the two patches.  I completed the patch angioplasty in the usual fashion.   I released all clamps and immediately there was a strong pulse in the patched segments.  I repaired a few bleeding point with 7-0 Prolene stitches.  I placed thrombin and gelfoam in the surgical wound.  Distally there was now a multiphasic posterior tibial artery signal and an unchanged anterior tibial artery signal which had a resistive character to it.    At this point, I removed the thrombin and gelfoam in the wound and washed it out.  There was no further bleeding.  Using interrupted 2-0 Vicryl stitches I reapproximated the soleus muscle to eliminate dead space.  I then ran a 3-0 Vicryl in the subcutaneous tissue.  I elected to staple the skin as I planned to keep the patient on heparin post-operative due to the thrombogenic surface in her popliteal and tibioperoneal trunk segment.  She will need to be loaded on Plavix also.  A sterile bandage was applied to her staple line.  COMPLICATIONS: none  CONDITION: stable  Adele Barthel, MD [facility] Office: [phone] Pager: [phone]  08/16/2013, 8:16 PM

## 2013-08-16 NOTE — Interval H&P Note (Signed)
[facility]  History and Physical Update  The patient was interviewed and re-examined.  The patient's previous History and Physical has been reviewed and is unchanged from Dr. Evelena Leyden consult.  There is no change in the plan of care: aortogram, bilateral leg runoff, and possible intervention.  Adele Barthel, MD [facility] [place] Office: [phone] Pager: [phone]  08/16/2013, 7:49 AM

## 2013-08-16 NOTE — Progress Notes (Signed)
ANTICOAGULATION CONSULT NOTE - Initial Consult  Pharmacy Consult for heparin Indication: Arterial embolism LLE  Allergies  Allergen Reactions  . Fish Allergy   . Ibuprofen     Patient Measurements: Height: 5' 6.93" (170 cm) Weight: 178 lb (80.74 kg) IBW/kg (Calculated) : 61.44 Heparin Dosing Weight: 80.7 kg  Vital Signs: Temp: 97.1 F (36.2 C) (05/07 2027) Temp src: Oral (05/07 1424) BP: 147/64 mmHg (05/07 2027) Pulse Rate: 81 (05/07 2027)  Labs:  Recent Labs  08/15/13 1233 08/16/13 0528 08/16/13 1400  HGB 14.6 13.8  --   HCT 42.6 40.2  --   PLT 200 201  --   APTT 29  --   --   LABPROT 11.7  --   --   INR 0.87  --   --   HEPARINUNFRC  --  <0.10* <0.10*  CREATININE 0.44* 0.46*  --   CKTOTAL 62  --   --     Estimated Creatinine Clearance: 98.9 ml/min (by C-G formula based on Cr of 0.46).  Assessment: 43 yo female with LLE arterial embolism for heparin. She is s/p stenting and subintimal angioplasty. IV heparin has been restarted at a low rate by VVS. Will cont at that rate by [facility] until further notice.   Goal of Therapy:  Heparin level 0.3-0.7 units/ml Monitor platelets by anticoagulation protocol: Yes   Plan:   Cont heparin at 500 units/hr F/u with level in AM F/u with [facility] about plan

## 2013-08-16 NOTE — Progress Notes (Addendum)
   Pt has intact perfusion to left foot via peroneal and AT artery.  Perfusion via AT is improved after SFA stenting.  There is evidence of some thrombus in TPT at this point, so I favor immediately proceeding with thrombectomy of the left tibioperoneal trunk to optimize this young patient's left leg perfusion.  The risk, benefits, and alternative for bypass operations were discussed with the patient.  The patient is aware the risks include but are not limited to: bleeding, infection, myocardial infarction, stroke, limb loss, nerve damage, need for additional procedures in the future, wound complications, and inability to complete the thrombectomy.  The patient is aware of these risks and agreed to proceed.  - pt will be temporarily transferred to 2W to watch the right groin as I have an emergent aortic procedure prior to her procedure  Adele Barthel, MD [facility] [place] Office: [phone] Pager: [phone]  08/16/2013, 10:51 AM

## 2013-08-17 ENCOUNTER — Encounter (HOSPITAL_COMMUNITY): Payer: Self-pay | Admitting: Vascular Surgery

## 2013-08-17 DIAGNOSIS — E785 Hyperlipidemia, unspecified: Secondary | ICD-10-CM | POA: Diagnosis not present

## 2013-08-17 DIAGNOSIS — I10 Essential (primary) hypertension: Secondary | ICD-10-CM | POA: Diagnosis not present

## 2013-08-17 DIAGNOSIS — I743 Embolism and thrombosis of arteries of the lower extremities: Secondary | ICD-10-CM | POA: Diagnosis not present

## 2013-08-17 DIAGNOSIS — E1159 Type 2 diabetes mellitus with other circulatory complications: Secondary | ICD-10-CM | POA: Diagnosis not present

## 2013-08-17 DIAGNOSIS — I359 Nonrheumatic aortic valve disorder, unspecified: Secondary | ICD-10-CM | POA: Diagnosis not present

## 2013-08-17 DIAGNOSIS — L02619 Cutaneous abscess of unspecified foot: Secondary | ICD-10-CM | POA: Diagnosis not present

## 2013-08-17 DIAGNOSIS — I798 Other disorders of arteries, arterioles and capillaries in diseases classified elsewhere: Secondary | ICD-10-CM | POA: Diagnosis not present

## 2013-08-17 LAB — GLUCOSE, CAPILLARY
GLUCOSE-CAPILLARY: 217 mg/dL — AB (ref 70–99)
Glucose-Capillary: 118 mg/dL — ABNORMAL HIGH (ref 70–99)
Glucose-Capillary: 127 mg/dL — ABNORMAL HIGH (ref 70–99)
Glucose-Capillary: 138 mg/dL — ABNORMAL HIGH (ref 70–99)

## 2013-08-17 LAB — HEPARIN LEVEL (UNFRACTIONATED)

## 2013-08-17 LAB — BASIC METABOLIC PANEL
BUN: 4 mg/dL — AB (ref 6–23)
CALCIUM: 8.7 mg/dL (ref 8.4–10.5)
CO2: 21 meq/L (ref 19–32)
CREATININE: 0.44 mg/dL — AB (ref 0.50–1.10)
Chloride: 101 mEq/L (ref 96–112)
GFR calc Af Amer: >90 mL/min (ref >90)
GLUCOSE: 121 mg/dL — AB (ref 70–99)
Potassium: 3.9 mEq/L (ref 3.7–5.3)
Sodium: 137 mEq/L (ref 137–147)

## 2013-08-17 LAB — CBC
HCT: 38.2 % (ref 36.0–46.0)
HEMOGLOBIN: 13.1 g/dL (ref 12.0–15.0)
MCH: 29.8 pg (ref 26.0–34.0)
MCHC: 34.3 g/dL (ref 30.0–36.0)
MCV: 87 fL (ref 78.0–100.0)
Platelets: 180 10*3/uL (ref 150–400)
RBC: 4.39 MIL/uL (ref 3.87–5.11)
RDW: 13.9 % (ref 11.5–15.5)
WBC: 11.6 10*3/uL — ABNORMAL HIGH (ref 4.0–10.5)

## 2013-08-17 MED ORDER — METHOCARBAMOL 500 MG PO TABS
500.0000 mg | ORAL_TABLET | Freq: Once | ORAL | Status: AC
  Administered 2013-08-17: 500 mg via ORAL
  Filled 2013-08-17: qty 1

## 2013-08-17 MED ORDER — OXYCODONE HCL 5 MG PO TABS
10.0000 mg | ORAL_TABLET | ORAL | Status: DC
  Administered 2013-08-17 – 2013-08-20 (×19): 10 mg via ORAL
  Filled 2013-08-17 (×19): qty 2

## 2013-08-17 MED ORDER — WARFARIN SODIUM 7.5 MG PO TABS
7.5000 mg | ORAL_TABLET | Freq: Once | ORAL | Status: AC
  Administered 2013-08-17: 7.5 mg via ORAL
  Filled 2013-08-17: qty 1

## 2013-08-17 MED ORDER — WARFARIN - PHARMACIST DOSING INPATIENT: Freq: Every day | Status: DC

## 2013-08-17 MED ORDER — LISINOPRIL 5 MG PO TABS
5.0000 mg | ORAL_TABLET | Freq: Every day | ORAL | Status: DC
  Administered 2013-08-17 – 2013-08-20 (×4): 5 mg via ORAL
  Filled 2013-08-17 (×4): qty 1

## 2013-08-17 MED ORDER — TIZANIDINE HCL 4 MG PO TABS
4.0000 mg | ORAL_TABLET | Freq: Once | ORAL | Status: AC
  Administered 2013-08-17: 4 mg via ORAL
  Filled 2013-08-17: qty 1

## 2013-08-17 MED ORDER — HYDROMORPHONE HCL PF 1 MG/ML IJ SOLN
1.0000 mg | INTRAMUSCULAR | Status: DC | PRN
  Administered 2013-08-17 (×2): 1 mg via SUBCUTANEOUS
  Filled 2013-08-17 (×2): qty 1

## 2013-08-17 MED ORDER — HYDROMORPHONE HCL PF 1 MG/ML IJ SOLN
1.0000 mg | INTRAMUSCULAR | Status: DC | PRN
  Administered 2013-08-17 – 2013-08-20 (×14): 1 mg via INTRAVENOUS
  Filled 2013-08-17 (×14): qty 1

## 2013-08-17 MED ORDER — ENOXAPARIN SODIUM 80 MG/0.8ML ~~LOC~~ SOLN
80.0000 mg | Freq: Two times a day (BID) | SUBCUTANEOUS | Status: DC
  Administered 2013-08-17 – 2013-08-19 (×6): 80 mg via SUBCUTANEOUS
  Filled 2013-08-17 (×8): qty 0.8

## 2013-08-17 NOTE — Progress Notes (Signed)
Pt stated she was holding her urine this afternoon because she did not want to stand up.  She was educated on how to stand and move, and pain medications were administered prior to getting up to [facility].  Pt refuses to let left leg touch the ground at all, and is refusing any movement beyond pivoting to bedside commode.

## 2013-08-17 NOTE — Discharge Instructions (Signed)
Information on my medicine - Coumadin   (Warfarin)  This medication education was reviewed with me or my healthcare representative as part of my discharge preparation.  The pharmacist that spoke with me during my hospital stay was:  Ysidro Evert [place], Early.D.  Why was Coumadin prescribed for you? Coumadin was prescribed for you because you have a blood clot or a medical condition that can cause an increased risk of forming blood clots. Blood clots can cause serious health problems by blocking the flow of blood to the heart, lung, or brain. Coumadin can prevent harmful blood clots from forming. As a reminder your indication for Coumadin is:   Arterial emboli  What test will check on my response to Coumadin? While on Coumadin (warfarin) you will need to have an INR test regularly to ensure that your dose is keeping you in the desired range. The INR (international normalized ratio) number is calculated from the result of the laboratory test called prothrombin time (PT).  If an INR APPOINTMENT HAS NOT ALREADY BEEN MADE FOR YOU please schedule an appointment to have this lab work done by your health care provider within 7 days. Your INR goal is usually a number between:  2 to 3 or your provider may give you a more narrow range like 2-2.5.  Ask your health care provider during an office visit what your goal INR is.  What  do you need to  know  About  COUMADIN? Take Coumadin (warfarin) exactly as prescribed by your healthcare provider about the same time each day.  DO NOT stop taking without talking to the doctor who prescribed the medication.  Stopping without other blood clot prevention medication to take the place of Coumadin may increase your risk of developing a new clot or stroke.  Get refills before you run out.  What do you do if you miss a dose? If you miss a dose, take it as soon as you remember on the same day then continue your regularly scheduled regimen the next day.  Do not take two doses of  Coumadin at the same time.  Important Safety Information A possible side effect of Coumadin (Warfarin) is an increased risk of bleeding. You should call your healthcare provider right away if you experience any of the following:   Bleeding from an injury or your nose that does not stop.   Unusual colored urine (red or dark brown) or unusual colored stools (red or black).   Unusual bruising for unknown reasons.   A serious fall or if you hit your head (even if there is no bleeding).  Some foods or medicines interact with Coumadin (warfarin) and might alter your response to warfarin. To help avoid this:   Eat a balanced diet, maintaining a consistent amount of Vitamin K.   Notify your provider about major diet changes you plan to make.   Avoid alcohol or limit your intake to 1 drink for women and 2 drinks for men per day. (1 drink is 5 oz. wine, 12 oz. beer, or 1.5 oz. liquor.)  Make sure that ANY health care provider who prescribes medication for you knows that you are taking Coumadin (warfarin).  Also make sure the healthcare provider who is monitoring your Coumadin knows when you have started a new medication including herbals and non-prescription products.  Coumadin (Warfarin)  Major Drug Interactions  Increased Warfarin Effect Decreased Warfarin Effect  Alcohol (large quantities) Antibiotics (esp. Septra/Bactrim, Flagyl, Cipro) Amiodarone (Cordarone) Aspirin (ASA) Cimetidine (Tagamet) Megestrol (Megace)  NSAIDs (ibuprofen, naproxen, etc.) Piroxicam (Feldene) Propafenone (Rythmol SR) Propranolol (Inderal) Isoniazid (INH) Posaconazole (Noxafil) Barbiturates (Phenobarbital) Carbamazepine (Tegretol) Chlordiazepoxide (Librium) Cholestyramine (Questran) Griseofulvin Oral Contraceptives Rifampin Sucralfate (Carafate) Vitamin K   Coumadin (Warfarin) Major Herbal Interactions  Increased Warfarin Effect Decreased Warfarin Effect  Garlic Ginseng Ginkgo biloba Coenzyme Q10 Green  tea St. Johns wort    Coumadin (Warfarin) FOOD Interactions  Eat a consistent number of servings per week of foods HIGH in Vitamin K (1 serving =  cup)  Collards (cooked, or boiled & drained) Kale (cooked, or boiled & drained) Mustard greens (cooked, or boiled & drained) Parsley *serving size only =  cup Spinach (cooked, or boiled & drained) Swiss chard (cooked, or boiled & drained) Turnip greens (cooked, or boiled & drained)  Eat a consistent number of servings per week of foods MEDIUM-HIGH in Vitamin K (1 serving = 1 cup)  Asparagus (cooked, or boiled & drained) Broccoli (cooked, boiled & drained, or raw & chopped) Brussel sprouts (cooked, or boiled & drained) *serving size only =  cup Lettuce, raw (green leaf, endive, romaine) Spinach, raw Turnip greens, raw & chopped   These websites have more information on Coumadin (warfarin):  FailFactory.se; VeganReport.com.au;

## 2013-08-17 NOTE — Evaluation (Signed)
Occupational Therapy Evaluation Patient Details Name: Toni Logan MRN: [id number] DOB: 05-09-1970 Today's Date: 08/17/2013    History of Present Illness 43 yo female s/p Left below knee popliteal and peroneal thrombectomy; tibial and peroneal endaterectomy with patch angioplasty . Hx of HTN, DM   Clinical Impression   Patient is s/p LT below knee popliteal and peroneal thrombectomy, tibial and peroneal endaterectomy with angioplasty surgery resulting in functional limitations due to the deficits listed below (see OT problem list).  Patient will benefit from skilled OT acutely to increase independence and safety with ADLS to allow discharge [place]. Pt currently limited by pain but should progress quickly     Follow Up Recommendations  Home health OT    Equipment Recommendations  3 in 1 bedside comode    Recommendations for Other Services       Precautions / Restrictions Precautions Precautions: Fall Restrictions Weight Bearing Restrictions: No      Mobility Bed Mobility Overal bed mobility: Needs Assistance Bed Mobility: Supine to Sit     Supine to sit: Min assist;HOB elevated (HOB ~25 degrees)     General bed mobility comments: pt requires (A) to hold LT LE however pt was supporting and progressing LT LE to EOB. OT was guarding LT LE more than supporting  Transfers Overall transfer level: Needs assistance   Transfers: Sit to/from Stand;Stand Pivot Transfers Sit to Stand: Min guard Stand pivot transfers: Min assist       General transfer comment: cues for hand placement of RT UE and supported with Gait belt. pt maintained NWB LT LE    Balance Overall balance assessment: Needs assistance Sitting-balance support: Feet supported;Single extremity supported Sitting balance-Leahy Scale: Good     Standing balance support: During functional activity;Single extremity supported Standing balance-Leahy Scale: Poor                              ADL  Overall ADL's : Needs assistance/impaired Eating/Feeding: Modified independent;Sitting   Grooming: Set up;Sitting               Lower Body Dressing: Moderate assistance;Sit to/from stand (don underwear) Lower Body Dressing Details (indicate cue type and reason): pt attempting to don Rt le first and educated on dressing LT LE first. Pt was unable to pull up pants in static standing. Pt minimal use of LT UE , supporting with Rt UE and nwb on LT LE. Pt is able to fully weight bear on LT LE but declines at this time Toilet Transfer: Minimal assistance;Stand-pivot;BSC (Rt side transfer only)   Toileting- Clothing Manipulation and Hygiene: Moderate assistance;Sitting/lateral lean         General ADL Comments: Pt supine in bed and reports need to move due to buttock hurting. pt progressed to EOB sitting. pt unable to fully place LT LE on floor. Pt requesting OOB. Pt transfering to the right into recliner. Pt reports improved comfort. pt tearful during session stating that LT LE is "grabbing" pain. Pt with minimal use of LT UE during session and declining weight bearing on LT LE.      Vision                     Perception Perception Perception Tested?: No   Praxis Praxis Praxis tested?: Within functional limits    Pertinent Vitals/Pain Severe pain LT LE- reports incr comfort OOB to chair Reports pain as "grabbing" premedicated     Hand  Dominance Right   Extremity/Trunk Assessment Upper Extremity Assessment Upper Extremity Assessment: LUE deficits/detail LUE Deficits / Details: Pt self reports Rotator cuff tear, Pt abducting arm to chest adn reports "i dont use this arm. I always hold it like this" pt demonstrates shoulder abduction ~30 degrees at EOB UE support briefly during session. pt declines true assessment of LT UE LUE: Unable to fully assess due to pain   Lower Extremity Assessment Lower Extremity Assessment: Defer to PT evaluation (demonstrates toes movement and  knee extension )   Cervical / Trunk Assessment Cervical / Trunk Assessment: Normal   Communication Communication Communication: No difficulties   Cognition Arousal/Alertness: Awake/alert Behavior During Therapy: WFL for tasks assessed/performed Overall Cognitive Status: Within Functional Limits for tasks assessed                     General Comments       Exercises       Shoulder Instructions      Home Living Family/patient expects to be discharged to:: Private residence Living Arrangements: Spouse/significant other (female partner) Available Help at Discharge: Family;Friend(s);Available PRN/intermittently (partner works 7p- 7A) Type of Home: Apartment Home Access: Level entry     Home Layout: One level     Bathroom Shower/Tub: Walk-in Hydrologist: Standard Bathroom Accessibility: Yes How Accessible: Accessible via walker Home Equipment: None          Prior Functioning/Environment Level of Independence: Independent        Comments: driving PTA, does not work    OT Diagnosis: Generalized weakness;Acute pain   OT Problem List: Decreased strength;Impaired balance (sitting and/or standing);Decreased activity tolerance;Decreased safety awareness;Decreased knowledge of use of DME or AE;Pain   OT Treatment/Interventions: Self-care/ADL training;Therapeutic exercise;DME and/or AE instruction;Therapeutic activities;Patient/family education;Balance training    OT Goals(Current goals can be found in the care plan section) Acute Rehab OT Goals Patient Stated Goal: to get off my butt OT Goal Formulation: With patient Potential to Achieve Goals: Good  OT Frequency: Min 2X/week   Barriers to D/C:            Co-evaluation              End of Session Nurse Communication: Mobility status;Precautions  Activity Tolerance: Patient limited by pain Patient left: in chair;with call bell/phone within reach;Other (comment) (RN notified)    Time: [id number] OT Time Calculation (min): 30 min Charges:  OT General Charges $OT Visit: 1 Procedure OT Evaluation $Initial OT Evaluation Tier I: 1 Procedure OT Treatments $Self Care/Home Management : 23-37 mins G-Codes:    Peri Maris 2013/09/07, 9:39 AM Pager: [phone]

## 2013-08-17 NOTE — Progress Notes (Signed)
Toni Logan is a 43 yo WF admitted by [facility] on 08/15/13 secondary to painful, ischemic toes on left foot. Workup revealed a left tibioperoneal trunk thromboembolism and she was taken to the OR today by Dr. Bridgett Larsson. (see his op note). [facility] attending was unable to see pt today secondary to her being in the OR. Dr. Sheran Fava asked this NP to check on pt postop. S: only complaint is cramps in the legs. Surgical pain is controlled. Was nervous earlier and Ativan helped.  O: VSS. Satting normally on RA. Appears well, sleepy, but oriented. RRR with 2/6 SEM heard. Normal resp effort. CTA, no wheezing. Dsgs dry, intact to LLE. Able to move LEs.  A/P: 1. Thromboembolism LLE s/p surgery to correct. Doing well. Remains on Heparin gtt per vascular. Plan/postop care per vascular. Only c/o is leg cramping. Will try one dose of Robaxin.  2. DM II-sugars stable. 3. HTN-up slightly, will cont meds and monitor.   Labs in am.  Clance Boll, NP [facility]

## 2013-08-17 NOTE — Progress Notes (Signed)
Coumadin and lovenox per pharmacy  Anticoagulation: Hep for arterial embolism of L leg. S/p angiogram 5/7 which showed L tibioperoneal trunk thromboembolism. S/p stenting and angioplasty. They restarted IV heparin at 500units/hr. Heparin level <0.1. Vascular wants to switch to therapeutic lovenox as bridge to coumadin today. Will need anticoagulation for at least 3 months. Coumadin score = 6  Goal of Therapy:  4hr anti-Xa level is 0.6-1 Monitor platelets by anticoagulation protocol: Yes  Hematology / Oncology: CBC stable   Plan: D/c heparin and heparin level Start lovenox 80mg  sq q12h 1 hour after heparin drip is off CBC every 72 hours Coumadin 7.5 mg po x1 Daily PT/INR

## 2013-08-17 NOTE — Progress Notes (Addendum)
[facility] PROGRESS NOTE  ICELYNN ONKEN FXT:[id number] DOB: 23-Mar-1971 DOA: 08/15/2013 PCP: Dustin Flock, PA-C  Brief Summary  The patient is a 43 y.o. year-old female with history of diabetes mellitus, hypertension, hypercholesterolemia, tobacco abuse, chronic leg pain since childhood who presents with acute embolic event of the left foot caused by showering of clot from the femoral artery region.  She underwent stent placement and thrombectomy by Dr. Bridgett Larsson on 5/7.    Assessment/Plan  Ischemic left toes due to arterial embolism of the left leg with possible overlying cellulitis or superficial thrombophlebitis.  The patient had to have severe calcific peripheral vascular disease of the left leg.  He - Continue antibiotics day 3 - Heparin/antiplatelets per vascular surgery  - Aortogram with left leg run off, stenting and subintimal angioplasty of left superficial femoral artery x 2 (Viabahn 5 mm x 100 mm, 5 mm x 50 mm) >> patient still in OR  -  Schedule oxycodone -  Dilaudid prn breakthrough pain   Intermittent chest pains, atypical. Does have risk factors for heart disease, however, and ECG is abnormal. Attempted to get records from [facility]  - ECHO pending  -  Telemetry:  Intermittent sinus tachycardia  HTN/HLD, blood pressures labile - Control pain  - Lipid panel, not at goal  - Continue atorvastatin  - start low dose lisinopril  DM, hyperglycemic in ER, A1c7.9  - Continue low dose SSI  - Consider metformin +/- SU at discharge   Hyponatremia, likely secondary to dehydration and resolved - Continue IVF until tolerating diet  Tobacco abuse  - Patient declines patch b/c it causes rash  - Start gum prn  Low grade fever, PE less likely due to constant heparination since admission.  Has been on antibiotics for cellulitis already.  Most likely due to atelectasis post 2 surgeries yesterday -  Early ambulation if able -  OOB -  Incentive spirometry -  If true fever, repeat  UA and CXR and consider broadening antibiotics  Diet:  Diabetic diet Access:  PIV IVF:  yes Proph:  heparin  Code Status: full code Family Communication: patient alone Disposition Plan: pending improvement in left leg    Consultants:  Dr. Bridgett Larsson, vascular surgery Procedures:  Aortogram with left leg run off and stenting and subintimal angioplasty of left superficial femoral artery x 2 (Viabahn 5 mm x 100 mm, 5 mm x 50 mm) followed by a left knee popliteal artery turbinectomy, endarterectomy and bovine patch angioplasty, left tibioperoneal trunk thrombectomy, endarterectomy, and bovine patch angioplasty, and left proximal anterior tibial artery endarterectomy Antibiotics:  Cefazolin 5/6 >>   HPI/Subjective:  Complains of severe pain despite oral and IV Dilaudid. Patient now admits that she was taking Vicodin for several years and has a high tolerance to narcotic pain medication.  Denies cough, shortness of breath, dysuria. Foley still in place.   Objective: Filed Vitals:   08/16/13 2130 08/16/13 2145 08/16/13 2200 08/17/13 0454  BP: 155/61 140/59 150/95 167/75  Pulse: 73 71 110 89  Temp:  97.9 F (36.6 C) 98.4 F (36.9 C) 100.3 F (37.9 C)  TempSrc:   Oral Oral  Resp: 15 17 18 19   Height:      Weight:    81.5 kg (179 lb 10.8 oz)  SpO2: 95% 95% 94% 97%    Intake/Output Summary (Last 24 hours) at 08/17/13 0736 Last data filed at 08/16/13 2200  Gross per 24 hour  Intake   2500 ml  Output   1625  ml  Net    875 ml   Filed Weights   08/16/13 0100 08/16/13 0511 08/17/13 0454  Weight: 81 kg (178 lb 9.2 oz) 80.74 kg (178 lb) 81.5 kg (179 lb 10.8 oz)    Exam:   General:  CF, No acute distress  HEENT:  NCAT, MMM  Cardiovascular:  RRR, nl S1, S2, 2/6 systolic murmur, 2+ pulses, warm extremities  Respiratory:  CTAB, no increased WOB  Abdomen:   NABS, soft, NT/ND  MSK:   Normal tone and bulk, minimal swelling of the left extremity.  Dressing of the posterior left calf  clean, dry, and intact, decreased duskiness of the tip of the left first toe and below the fourth and fifth digits of the left foot  Neuro:  Grossly intact  Data Reviewed: Basic Metabolic Panel:  Recent Labs Lab 08/15/13 1233 08/16/13 0528 08/17/13 0514  NA 131* 135* 137  K 3.7 3.9 3.9  CL 98 101 101  CO2 20 22 21   GLUCOSE 181* 157* 121*  BUN 8 7 4*  CREATININE 0.44* 0.46* 0.44*  CALCIUM 8.8 8.5 8.7   Liver Function Tests: No results found for this basename: AST, ALT, ALKPHOS, BILITOT, PROT, ALBUMIN,  in the last 168 hours No results found for this basename: LIPASE, AMYLASE,  in the last 168 hours No results found for this basename: AMMONIA,  in the last 168 hours CBC:  Recent Labs Lab 08/15/13 1233 08/16/13 0528 08/17/13 0514  WBC 9.6 7.4 11.6*  NEUTROABS 6.3  --   --   HGB 14.6 13.8 13.1  HCT 42.6 40.2 38.2  MCV 87.3 87.6 87.0  PLT 200 201 180   Cardiac Enzymes:  Recent Labs Lab 08/15/13 1233  CKTOTAL 62   BNP (last 3 results) No results found for this basename: PROBNP,  in the last 8760 hours CBG:  Recent Labs Lab 08/16/13 0755 08/16/13 1017 08/16/13 2029 08/16/13 2220 08/17/13 0634  GLUCAP 136* 131* 134* 148* 118*    No results found for this or any previous visit (from the past 240 hour(s)).   Studies: No results found.  Scheduled Meds: . atorvastatin  80 mg Oral q1800  .  ceFAZolin (ANCEF) IV  1 g Intravenous 3 times per day  . docusate sodium  100 mg Oral BID  . HYDROmorphone      . insulin aspart  0-5 Units Subcutaneous QHS  . insulin aspart  0-9 Units Subcutaneous TID WC  . oxyCODONE      . pantoprazole  40 mg Oral Daily  . senna  1 tablet Oral BID  . sodium chloride  3 mL Intravenous Q12H   Continuous Infusions: . sodium chloride 75 mL/hr at 08/16/13 2222  . heparin 500 Units/hr (08/16/13 2200)    Active Problems:   Arterial embolism of left leg   Essential hypertension, benign   Hyperlipidemia   Diabetes mellitus with  peripheral vascular disease   Tobacco abuse   Cellulitis of left foot    Time spent: 30 min    [place] [facility] Pager [phone]. If 7PM-7AM, please contact night-coverage at [email or web address], password [facility] 08/17/2013, 7:36 AM  LOS: 2 days

## 2013-08-17 NOTE — Progress Notes (Signed)
PT Cancellation Note  Patient Details Name: Toni Logan MRN: [id number] DOB: Jan 28, 1971   Cancelled Treatment:    Reason Eval/Treat Not Completed: Pain limiting ability to participate. Attempted evaluation x2 this afternoon. Pt waiting for pain medication at first attempt; coordinated with RN to return at an agreed upon time and when PT returned pt continued to decline due to pain. Will attempt again tomorrow.    Jolyn Lent 08/17/2013, 3:41 PM  Jolyn Lent, PT, DPT Acute Rehabilitation Services Pager: [phone]

## 2013-08-17 NOTE — Progress Notes (Signed)
   Daily Progress Note  Assessment/Planning: POD #1 s/p L SFA stenting; L pop and TPT TE; L pop & TPT EA and BPA; L AT EA w/ BPA for L SFA occlusion and acute on chronic tibial artery disease with acute occlusion, L foot thromboembolism   Palpable PT and AT today.  I essential endarterectomized from the below-the-knee popliteal artery all the way down to tibioperoneal trunk and patched this segment.  Only therapy left is a pop-tib bypass or fem-tib bypass, which in a young patient is ill advised due to risk of limb loss.  Continued ischemic L 1st, 4th, and 5th toes  Convert to therapeutic dose Lovenox as bridge to Coumadin  Pt will need to be continued on anticoagulation for at least 3 month to have some chance of lyzing the thromboembolism in her digital arteries.  There is essentially no other therapy for the toes.  While the toes remain ischemic, she will continue to have some pain.  I emphasized to the patient that she has end-stage atherosclerosis present in her tibial arteries at the age of only 49.  She knows she need to have maximal medical management including statin, Plavix, and smoking cessation.  ABI pending  PT/OT c/s  Subjective  - 1 Day Post-Op  C/o L calf cramping  Objective Filed Vitals:   08/16/13 2130 08/16/13 2145 08/16/13 2200 08/17/13 0454  BP: 155/61 140/59 150/95 167/75  Pulse: 73 71 110 89  Temp:  97.9 F (36.6 C) 98.4 F (36.9 C) 100.3 F (37.9 C)  TempSrc:   Oral Oral  Resp: 15 17 18 19   Height:      Weight:    179 lb 10.8 oz (81.5 kg)  SpO2: 95% 95% 94% 97%    Intake/Output Summary (Last 24 hours) at 08/17/13 0831 Last data filed at 08/16/13 2200  Gross per 24 hour  Intake   2500 ml  Output   1625 ml  Net    875 ml    PULM  CTAB CV  RRR GI  soft, NTND VASC  Palpable PT, faintly palpable AT, ischemic L 1,4,5 toes, calf bandaged without any active bleeding  Laboratory CBC    Component Value Date/Time   WBC 11.6* 08/17/2013 0514   HGB 13.1 08/17/2013 0514   HCT 38.2 08/17/2013 0514   PLT 180 08/17/2013 0514    BMET    Component Value Date/Time   NA 137 08/17/2013 0514   K 3.9 08/17/2013 0514   CL 101 08/17/2013 0514   CO2 21 08/17/2013 0514   GLUCOSE 121* 08/17/2013 0514   BUN 4* 08/17/2013 0514   CREATININE 0.44* 08/17/2013 0514   CALCIUM 8.7 08/17/2013 0514   GFRNONAA >90 08/17/2013 0514   GFRAA >90 08/17/2013 9371    Adele Barthel, MD [facility] Office: [phone] Pager: [phone]  08/17/2013, 8:31 AM

## 2013-08-17 NOTE — Progress Notes (Signed)
PT Cancellation Note  Patient Details Name: Toni Logan MRN: [id number] DOB: Aug 31, 1970   Cancelled Treatment:    Reason Eval/Treat Not Completed: Patient at procedure or test/unavailable (pt off floor for test)   [name] 08/17/2013, 1:36 PM Elwyn Reach, [place]

## 2013-08-17 NOTE — Progress Notes (Signed)
  Echocardiogram 2D Echocardiogram has been performed.  Valinda Hoar 08/17/2013, 1:44 PM

## 2013-08-18 DIAGNOSIS — F17200 Nicotine dependence, unspecified, uncomplicated: Secondary | ICD-10-CM

## 2013-08-18 DIAGNOSIS — I743 Embolism and thrombosis of arteries of the lower extremities: Secondary | ICD-10-CM | POA: Diagnosis not present

## 2013-08-18 DIAGNOSIS — E1159 Type 2 diabetes mellitus with other circulatory complications: Secondary | ICD-10-CM | POA: Diagnosis not present

## 2013-08-18 DIAGNOSIS — I798 Other disorders of arteries, arterioles and capillaries in diseases classified elsewhere: Secondary | ICD-10-CM | POA: Diagnosis not present

## 2013-08-18 DIAGNOSIS — I10 Essential (primary) hypertension: Secondary | ICD-10-CM | POA: Diagnosis not present

## 2013-08-18 DIAGNOSIS — E785 Hyperlipidemia, unspecified: Secondary | ICD-10-CM | POA: Diagnosis not present

## 2013-08-18 LAB — GLUCOSE, CAPILLARY
GLUCOSE-CAPILLARY: 151 mg/dL — AB (ref 70–99)
GLUCOSE-CAPILLARY: 145 mg/dL — AB (ref 70–99)
GLUCOSE-CAPILLARY: 202 mg/dL — AB (ref 70–99)
Glucose-Capillary: 165 mg/dL — ABNORMAL HIGH (ref 70–99)

## 2013-08-18 LAB — CBC
HEMATOCRIT: 35.6 % — AB (ref 36.0–46.0)
HEMOGLOBIN: 11.9 g/dL — AB (ref 12.0–15.0)
MCH: 29.5 pg (ref 26.0–34.0)
MCHC: 33.4 g/dL (ref 30.0–36.0)
MCV: 88.1 fL (ref 78.0–100.0)
Platelets: 162 10*3/uL (ref 150–400)
RBC: 4.04 MIL/uL (ref 3.87–5.11)
RDW: 13.8 % (ref 11.5–15.5)
WBC: 5.3 10*3/uL (ref 4.0–10.5)

## 2013-08-18 LAB — PROTIME-INR
INR: 1.21 (ref 0.00–1.49)
Prothrombin Time: 15 seconds (ref 11.6–15.2)

## 2013-08-18 MED ORDER — MORPHINE SULFATE ER 15 MG PO TBCR
15.0000 mg | EXTENDED_RELEASE_TABLET | Freq: Two times a day (BID) | ORAL | Status: DC
  Administered 2013-08-18 – 2013-08-20 (×5): 15 mg via ORAL
  Filled 2013-08-18 (×5): qty 1

## 2013-08-18 MED ORDER — DOXYCYCLINE HYCLATE 100 MG PO TABS
100.0000 mg | ORAL_TABLET | Freq: Two times a day (BID) | ORAL | Status: DC
  Administered 2013-08-18 – 2013-08-20 (×5): 100 mg via ORAL
  Filled 2013-08-18 (×7): qty 1

## 2013-08-18 MED ORDER — WARFARIN SODIUM 5 MG PO TABS
5.0000 mg | ORAL_TABLET | Freq: Once | ORAL | Status: AC
  Administered 2013-08-18: 5 mg via ORAL
  Filled 2013-08-18: qty 1

## 2013-08-18 NOTE — Progress Notes (Signed)
Chart reviewed.  [facility] PROGRESS NOTE  DECIE VERNE LOV:[id number] DOB: 1970/06/26 DOA: 08/15/2013 PCP: Dustin Flock, PA-C  Brief Summary  The patient is a 43 y.o. year-old female with history of diabetes mellitus, hypertension, hypercholesterolemia, tobacco abuse, chronic leg pain since childhood who presents with acute embolic event of the left foot caused by showering of clot from the femoral artery region.  She underwent stent placement and thrombectomy by Dr. Bridgett Larsson on 5/7.    Assessment/Plan  Ischemic left toes due to arterial embolism of the left leg No cellulitis currently. Change to oral antibiotics   The patient had to have severe calcific peripheral vascular disease of the left leg.   - Aortogram with left leg run off, stenting and subintimal angioplasty of left superficial femoral artery x 2 (Viabahn 5 mm x 100 mm, 5 mm x 50 mm) >> patient still in OR  Pain still uncontrolled. Suspect opiate dependence/tolerance, but certainly has acute component.  Add MS contin., continue oxycodone. IV Dilaudid prn breakthrough pain. D/c IV morphine to simplify MAR On Lovenox and Coumadin. PT evaluation pending.  Intermittent chest pains, atypical. None today. Echocardiogram shows normal ejection fraction and no definite wall motion abnormalities. No further workup at this time.  HLD On statin  Hypertension: Controlled on ACE inhibitor  DM, hyperglycemic in ER, A1c7.9  - Continue low dose SSI  - Consider metformin +/- SU at discharge   Hyponatremia, resolved  Tobacco abuse  Counseled against.  Low grade fever, PE less likely due to constant heparination since admission.  Has been on antibiotics for cellulitis already.  Most likely due to atelectasis post 2 surgeries yesterday -  Early ambulation if able -  OOB -  Incentive spirometry -  If true fever, repeat UA and CXR and consider broadening antibiotics   Code Status: full code Family Communication: patient  alone Disposition Plan:  home eventually    Consultants:  Dr. Bridgett Larsson, vascular surgery Procedures:  Aortogram with left leg run off and stenting and subintimal angioplasty of left superficial femoral artery x 2 (Viabahn 5 mm x 100 mm, 5 mm x 50 mm) followed by a left knee popliteal artery turbinectomy, endarterectomy and bovine patch angioplasty, left tibioperoneal trunk thrombectomy, endarterectomy, and bovine patch angioplasty, and left proximal anterior tibial artery endarterectomy Antibiotics:  Cefazolin 5/6 >> 5/9 Doxy 5/9  HPI/Subjective: Complaining of toe pain. Complaining of pain from Lovenox shots. Nurses report that she requests oxycodone and Dilaudid to be given at the same time.   Objective: Filed Vitals:   08/17/13 0454 08/17/13 1404 08/17/13 2156 08/18/13 0444  BP: 167/75 125/55 103/58 118/55  Pulse: 89 84 74 76  Temp: 100.3 F (37.9 C) 98.7 F (37.1 C) 100.1 F (37.8 C) 99.3 F (37.4 C)  TempSrc: Oral Oral Oral Oral  Resp: 19 18 18 18   Height:      Weight: 81.5 kg (179 lb 10.8 oz)     SpO2: 97% 95% 94% 94%    Intake/Output Summary (Last 24 hours) at 08/18/13 0943 Last data filed at 08/18/13 0918  Gross per 24 hour  Intake    360 ml  Output   2550 ml  Net  -2190 ml   Filed Weights   08/16/13 0100 08/16/13 0511 08/17/13 0454  Weight: 81 kg (178 lb 9.2 oz) 80.74 kg (178 lb) 81.5 kg (179 lb 10.8 oz)    Exam:   General:  tearful. Cooperative. Alert and oriented.   Cardiovascular:  RRR, nl S1,  S2, 2/6 systolic murmur  Respiratory:  CTAB, no increased WOB  Abdomen:   NABS, soft, NT/ND Extremities:   Left leg dressing intact and clean. Fourth and fifth toe with dark discoloration on the plantar surface.no erythema warmth    Data Reviewed: Basic Metabolic Panel:  Recent Labs Lab 08/15/13 1233 08/16/13 0528 08/17/13 0514  NA 131* 135* 137  K 3.7 3.9 3.9  CL 98 101 101  CO2 20 22 21   GLUCOSE 181* 157* 121*  BUN 8 7 4*  CREATININE 0.44* 0.46*  0.44*  CALCIUM 8.8 8.5 8.7   Liver Function Tests: No results found for this basename: AST, ALT, ALKPHOS, BILITOT, PROT, ALBUMIN,  in the last 168 hours No results found for this basename: LIPASE, AMYLASE,  in the last 168 hours No results found for this basename: AMMONIA,  in the last 168 hours CBC:  Recent Labs Lab 08/15/13 1233 08/16/13 0528 08/17/13 0514 08/18/13 0451  WBC 9.6 7.4 11.6* 5.3  NEUTROABS 6.3  --   --   --   HGB 14.6 13.8 13.1 11.9*  HCT 42.6 40.2 38.2 35.6*  MCV 87.3 87.6 87.0 88.1  PLT 200 201 180 162   Cardiac Enzymes:  Recent Labs Lab 08/15/13 1233  CKTOTAL 62   BNP (last 3 results) No results found for this basename: PROBNP,  in the last 8760 hours CBG:  Recent Labs Lab 08/17/13 0634 08/17/13 1124 08/17/13 1619 08/17/13 2154 08/18/13 0631  GLUCAP 118* 127* 138* 217* 151*    No results found for this or any previous visit (from the past 240 hour(s)).   Studies: No results found. Echo Left ventricle: The cavity size was normal. Wall thickness was increased in a pattern of mild LVH. Systolic function was normal. The estimated ejection fraction was in the range of 60% to 65%. Regional wall motion abnormalities cannot be excluded. Doppler parameters are consistent with abnormal left ventricular relaxation (grade 1 diastolic dysfunction). - Aortic valve: There was mild stenosis. Impressions:  - Technically difficult; LV function is normal; focal wall motion abnormality cannot be excluded; there is mild AS by doppler with peak velocity of 2.7 m/s and mean gradient of 14 mmHg but visually valve appears to open well.  Scheduled Meds: . atorvastatin  80 mg Oral q1800  .  ceFAZolin (ANCEF) IV  1 g Intravenous 3 times per day  . docusate sodium  100 mg Oral BID  . enoxaparin (LOVENOX) injection  80 mg Subcutaneous Q12H  . insulin aspart  0-5 Units Subcutaneous QHS  . insulin aspart  0-9 Units Subcutaneous TID WC  . lisinopril  5 mg Oral  Daily  . oxyCODONE  10 mg Oral Q4H  . pantoprazole  40 mg Oral Daily  . senna  1 tablet Oral BID  . sodium chloride  3 mL Intravenous Q12H  . Warfarin - Pharmacist Dosing Inpatient   Does not apply q1800   Continuous Infusions:    Time spent: 35 min  Delfina Redwood, MD [facility] Pager [phone]. If 7PM-7AM, please contact night-coverage at [email or web address], password [facility] 08/18/2013, 9:43 AM  LOS: 3 days

## 2013-08-18 NOTE — Progress Notes (Deleted)
[name] over discharge instructions with patient. Daughter at bedside. IV discontinued. Patient taken off cardiac telemetry. Patient belongings retrieved from security. All belongings accounted for. Patient given paper prescription. Patient discharged home. Toni Logan

## 2013-08-18 NOTE — Evaluation (Signed)
Physical Therapy Evaluation Patient Details Name: Toni Logan MRN: [id number] DOB: 13-Nov-1970 Today's Date: 08/18/2013   History of Present Illness  43 yo female s/p Left below knee popliteal and peroneal thrombectomy; tibial and peroneal endaterectomy with patch angioplasty . Hx of HTN, DM  Clinical Impression  Patient is s/p above surgery resulting in functional limitations due to the deficits listed below (see PT Problem List). Pt very limited by pain (both her calf and due to IV placement Lt wrist). Patient will benefit from skilled PT to increase their independence and safety with mobility to allow discharge to the venue listed below.       Follow Up Recommendations Home health PT;Supervision for mobility/OOB    Equipment Recommendations  Rolling walker with 5" wheels    Recommendations for Other Services       Precautions / Restrictions Precautions Precautions: Fall Restrictions Weight Bearing Restrictions: No Other Position/Activity Restrictions: pt self-selects NWB LLE      Mobility  Bed Mobility Overal bed mobility: Modified Independent Bed Mobility: Supine to Sit     Supine to sit: Modified independent (Device/Increase time)     General bed mobility comments: HOB flat, very slow, but moves safely  Transfers Overall transfer level: Needs assistance Equipment used: Rolling walker (2 wheeled) Transfers: Sit to/from [facility] Sit to Stand: Min guard Stand pivot transfers: Min assist       General transfer comment: vc for safe use of RW; when pain in wrist (due to IV) was too severe for pt, she let go of RW, reached for armrest of recliner and pivoted to sit  Ambulation/Gait Ambulation/Gait assistance: Min assist Ambulation Distance (Feet): 2 Feet Assistive device: Rolling walker (2 wheeled) Gait Pattern/deviations: Step-to pattern     General Gait Details: selects NWB LLE; limited by pain in wrist due to IV Investment banker, corporate notified)  Stairs             Wheelchair Mobility    Modified Rankin (Stroke Patients Only)       Balance                                             Pertinent Vitals/Pain 30/10 RT calf, despite premedication for pain "that doesn't do anything" patient repositioned for comfort RN made aware    Home Living Family/patient expects to be discharged to:: Private residence Living Arrangements: Spouse/significant other Available Help at Discharge: Family;Available PRN/intermittently (partner works [employer], other family members) Type of Home: Apartment (house divided into 6 apartments) Home Access: Stairs to enter Chiropractor:  (post) Technical brewer of Steps: 1 Home Layout: One level Home Equipment: None      Prior Function Level of Independence: Independent         Comments: driving PTA, does not work     Journalist, newspaper        Extremity/Trunk Assessment   Upper Extremity Assessment: Defer to OT evaluation           Lower Extremity Assessment: LLE deficits/detail   LLE Deficits / Details: minimally moves toes and ankle (DF, PF, inversion, eversion) due to pain; lacks 15 degrees knee extension due to pain;  Cervical / Trunk Assessment: Normal  Communication   Communication: No difficulties  Cognition Arousal/Alertness: Awake/alert Behavior During Therapy: Anxious (tearful) Overall Cognitive Status: Within Functional Limits for tasks assessed  General Comments General comments (skin integrity, edema, etc.): easily frustrated due to pain; wanted to try walking    Exercises General Exercises - Lower Extremity Ankle Circles/Pumps: AROM;Left;10 reps;Supine Quad Sets: AROM;Right;5 reps;Supine      Assessment/Plan    PT Assessment Patient needs continued PT services  PT Diagnosis Difficulty walking;Acute pain   PT Problem List Decreased activity tolerance;Decreased balance;Decreased mobility;Decreased  knowledge of use of DME;Decreased safety awareness;Pain  PT Treatment Interventions DME instruction;Gait training;Stair training;Functional mobility training;Therapeutic activities;Therapeutic exercise;Patient/family education   PT Goals (Current goals can be found in the Care Plan section) Acute Rehab PT Goals Patient Stated Goal: to decr pain PT Goal Formulation: With patient Time For Goal Achievement: 08/22/13 Potential to Achieve Goals: Good    Frequency Min 3X/week   Barriers to discharge        Co-evaluation               End of Session Equipment Utilized During Treatment: Gait belt Activity Tolerance: Patient limited by pain Patient left: in chair;with call bell/phone within reach Nurse Communication: Mobility status;Other (comment) (desire to have wrist IV moved to another site)         Time: 0263-7858 PT Time Calculation (min): 18 min   Charges:   PT Evaluation $Initial PT Evaluation Tier I: 1 Procedure PT Treatments $Therapeutic Exercise: 8-22 mins   PT G CodesJeanie Cooks Layci Stenglein 2013/09/06, 10:44 AM Pager [phone]

## 2013-08-18 NOTE — Progress Notes (Signed)
   VASCULAR PROGRESS NOTE  SUBJECTIVE: Moderate discomfort.   PHYSICAL EXAM: Filed Vitals:   08/17/13 0454 08/17/13 1404 08/17/13 2156 08/18/13 0444  BP: 167/75 125/55 103/58 118/55  Pulse: 89 84 74 76  Temp: 100.3 F (37.9 C) 98.7 F (37.1 C) 100.1 F (37.8 C) 99.3 F (37.4 C)  TempSrc: Oral Oral Oral Oral  Resp: 19 18 18 18   Height:      Weight: 179 lb 10.8 oz (81.5 kg)     SpO2: 97% 95% 94% 94%   Palpable left PT pulse Calf soft Dressing dry  LABS: Lab Results  Component Value Date   WBC 5.3 08/18/2013   HGB 11.9* 08/18/2013   HCT 35.6* 08/18/2013   MCV 88.1 08/18/2013   PLT 162 08/18/2013   Lab Results  Component Value Date   CREATININE 0.44* 08/17/2013   Lab Results  Component Value Date   INR 1.21 08/18/2013   CBG (last 3)   Recent Labs  08/17/13 1619 08/17/13 2154 08/18/13 0631  GLUCAP 138* 217* 151*    Active Problems:   Arterial embolism of left leg   Essential hypertension, benign   Hyperlipidemia   Diabetes mellitus with peripheral vascular disease   Tobacco abuse   Cellulitis of left foot   ASSESSMENT AND PLAN:  * 2 Days Post-Op s/p: L BK pop, TPT, ant. Tibial artery endarterectomy with bovine patch angioplasty, and thrombectomy.   * Lovenox and coumadin per pharmacy  *  Home when more mobile.   Gae Gallop Beeper: [phone] 08/18/2013

## 2013-08-18 NOTE — Progress Notes (Addendum)
ANTICOAGULATION CONSULT NOTE - Follow Up Consult  Pharmacy Consult for lovenox and coumadin Indication: arterial embolism of left leg  Allergies  Allergen Reactions  . Fish Allergy   . Ibuprofen     Patient Measurements: Height: 5' 6.93" (170 cm) Weight: 179 lb 10.8 oz (81.5 kg) IBW/kg (Calculated) : 61.44 Heparin Dosing Weight:   Vital Signs: Temp: 99.3 F (37.4 C) (05/09 0444) Temp src: Oral (05/09 0444) BP: 118/55 mmHg (05/09 0444) Pulse Rate: 76 (05/09 0444)  Labs:  Recent Labs  08/15/13 1233 08/16/13 0528 08/16/13 1400 08/17/13 0500 08/17/13 0514 08/18/13 0451  HGB 14.6 13.8  --   --  13.1 11.9*  HCT 42.6 40.2  --   --  38.2 35.6*  PLT 200 201  --   --  180 162  APTT 29  --   --   --   --   --   LABPROT 11.7  --   --   --   --  15.0  INR 0.87  --   --   --   --  1.21  HEPARINUNFRC  --  <0.10* <0.10* <0.10*  --   --   CREATININE 0.44* 0.46*  --   --  0.44*  --   CKTOTAL 62  --   --   --   --   --     Estimated Creatinine Clearance: 99.3 ml/min (by C-G formula based on Cr of 0.44).   Medications:  Scheduled:  . atorvastatin  80 mg Oral q1800  . docusate sodium  100 mg Oral BID  . doxycycline  100 mg Oral Q12H  . enoxaparin (LOVENOX) injection  80 mg Subcutaneous Q12H  . insulin aspart  0-5 Units Subcutaneous QHS  . insulin aspart  0-9 Units Subcutaneous TID WC  . lisinopril  5 mg Oral Daily  . morphine  15 mg Oral Q12H  . oxyCODONE  10 mg Oral Q4H  . pantoprazole  40 mg Oral Daily  . senna  1 tablet Oral BID  . sodium chloride  3 mL Intravenous Q12H  . Warfarin - Pharmacist Dosing Inpatient   Does not apply q1800   Infusions:    Assessment: 43 yo female with arterial embolism of left leg is currently on subtherapeutic coumadin bridging with therapeutic dose of lovenox.  Hgb 11.9 and Plt 162 K stable. INR is up a little to 1.21. On doxycyline now. Goal of Therapy:  Anti-Xa level 0.6-1 units/ml 4hrs after LMWH dose given: INR 2-3 Monitor  platelets by anticoagulation protocol: Yes   Plan:  Cont lovenox 80mg  sq q12h CBC every 72 hours Coumadin 7.5 mg po x1 Daily PT/INR Coumadin educated   [name] 08/18/2013,11:15 AM

## 2013-08-18 NOTE — Progress Notes (Signed)
Patient is refusing insulin. Stated that her sugars "are not high enough" to get insulin and that she is "scared" of insulin. Patient stated years ago she was on metformin, but her MD discontinued it because she was controlling her glucose through diet and exercise. Will notify MD of situation. Roxan Hockey

## 2013-08-19 DIAGNOSIS — M79609 Pain in unspecified limb: Secondary | ICD-10-CM

## 2013-08-19 DIAGNOSIS — E785 Hyperlipidemia, unspecified: Secondary | ICD-10-CM | POA: Diagnosis not present

## 2013-08-19 DIAGNOSIS — E1159 Type 2 diabetes mellitus with other circulatory complications: Secondary | ICD-10-CM | POA: Diagnosis not present

## 2013-08-19 DIAGNOSIS — I743 Embolism and thrombosis of arteries of the lower extremities: Secondary | ICD-10-CM

## 2013-08-19 DIAGNOSIS — I10 Essential (primary) hypertension: Secondary | ICD-10-CM | POA: Diagnosis not present

## 2013-08-19 LAB — PROTIME-INR
INR: 1.48 (ref 0.00–1.49)
Prothrombin Time: 17.5 seconds — ABNORMAL HIGH (ref 11.6–15.2)

## 2013-08-19 LAB — GLUCOSE, CAPILLARY
GLUCOSE-CAPILLARY: 107 mg/dL — AB (ref 70–99)
Glucose-Capillary: 161 mg/dL — ABNORMAL HIGH (ref 70–99)
Glucose-Capillary: 136 mg/dL — ABNORMAL HIGH (ref 70–99)
Glucose-Capillary: 193 mg/dL — ABNORMAL HIGH (ref 70–99)

## 2013-08-19 LAB — CBC
HCT: 35.8 % — ABNORMAL LOW (ref 36.0–46.0)
Hemoglobin: 12 g/dL (ref 12.0–15.0)
MCH: 29.6 pg (ref 26.0–34.0)
MCHC: 33.5 g/dL (ref 30.0–36.0)
MCV: 88.2 fL (ref 78.0–100.0)
PLATELETS: 171 10*3/uL (ref 150–400)
RBC: 4.06 MIL/uL (ref 3.87–5.11)
RDW: 14 % (ref 11.5–15.5)
WBC: 4.6 10*3/uL (ref 4.0–10.5)

## 2013-08-19 MED ORDER — WARFARIN SODIUM 5 MG PO TABS
5.0000 mg | ORAL_TABLET | Freq: Once | ORAL | Status: AC
  Administered 2013-08-19: 5 mg via ORAL
  Filled 2013-08-19: qty 1

## 2013-08-19 MED ORDER — METFORMIN HCL 500 MG PO TABS
500.0000 mg | ORAL_TABLET | Freq: Every day | ORAL | Status: DC
  Administered 2013-08-19 – 2013-08-20 (×2): 500 mg via ORAL
  Filled 2013-08-19 (×3): qty 1

## 2013-08-19 NOTE — Progress Notes (Signed)
I had an in depth discussion on smoking cessation with pt. We explored what she would be willing to do and asking for help from her family. She agrees that she will "try" to stop smoking and has called her sister and asked that she clean all of the ashtrays and smoking related material from her apartment.

## 2013-08-19 NOTE — Progress Notes (Addendum)
VASCULAR LAB PRELIMINARY  ARTERIAL  ABI completed:    RIGHT    LEFT    PRESSURE WAVEFORM  PRESSURE WAVEFORM  BRACHIAL 123 Triphasic BRACHIAL 138 Triphasic  DP 102 Biphasic DP 101 Triphasic  PT 152 Biphasic PT 154 Triphasic    RIGHT LEFT  ABI 1.1 1.12   Post Operative ABIs indicate normal arterial flow bilaterally at rest.   Toni Logan, [place] 08/19/2013, 2:59 PM

## 2013-08-19 NOTE — Progress Notes (Signed)
   VASCULAR PROGRESS NOTE  SUBJECTIVE: Still says it hurts to put weight on her left foot.  PHYSICAL EXAM: Filed Vitals:   08/18/13 0444 08/18/13 1313 08/18/13 2148 08/19/13 0432  BP: 118/55 122/62 110/60 127/74  Pulse: 76 70 72 73  Temp: 99.3 F (37.4 C) 98.9 F (37.2 C) 98.3 F (36.8 C) 98.4 F (36.9 C)  TempSrc: Oral Oral Oral Oral  Resp: 18 18 20 18   Height:      Weight:      SpO2: 94% 98% 99% 98%   Palpable left posterior tibial pulse. Incision looks fine.  LABS: Lab Results  Component Value Date   WBC 4.6 08/19/2013   HGB 12.0 08/19/2013   HCT 35.8* 08/19/2013   MCV 88.2 08/19/2013   PLT 171 08/19/2013   Lab Results  Component Value Date   CREATININE 0.44* 08/17/2013   Lab Results  Component Value Date   INR 1.48 08/19/2013   CBG (last 3)   Recent Labs  08/18/13 1708 08/18/13 2141 08/19/13 0533  GLUCAP 202* 165* 161*    Active Problems:   Arterial embolism of left leg   Essential hypertension, benign   Hyperlipidemia   Diabetes mellitus with peripheral vascular disease   Tobacco abuse   Cellulitis of left foot   ASSESSMENT AND PLAN:  * 3 Days Post-Op s/p: L BK pop, TPT, ant. tibial artery endarterectomy with bovine patch angioplasty, and thrombectomy.   * Lovenox and coumadin per pharmacy (INR today is 1.48)  * She knows she needs to quit smoking.  * Home when more mobile.   Gae Gallop Beeper: [phone] 08/19/2013

## 2013-08-19 NOTE — Progress Notes (Signed)
[facility] PROGRESS NOTE  Toni Logan DDU:[id number] DOB: Jun 06, 1970 DOA: 08/15/2013 PCP: Dustin Flock, PA-C  Brief Summary  The patient is a 43 y.o. year-old female with history of diabetes mellitus, hypertension, hypercholesterolemia, tobacco abuse, chronic leg pain since childhood who presents with acute embolic event of the left foot caused by showering of clot from the femoral artery region.  She underwent stent placement and thrombectomy by Dr. Bridgett Larsson on 5/7.    Assessment/Plan  Ischemic left toes due to arterial embolism of the left leg On doxycycline   The patient had to have severe calcific peripheral vascular disease of the left leg.   - Aortogram with left leg run off, stenting and subintimal angioplasty of left superficial femoral artery x 2 (Viabahn 5 mm x 100 mm, 5 mm x 50 mm) >> patient still in OR  Continue current pain regimen On Lovenox and Coumadin. INR coming up. PT recs noted.  Intermittent chest pains, atypical. None today. Echocardiogram shows normal ejection fraction and no definite wall motion abnormalities. No further workup at this time.  HLD On statin  Hypertension: Controlled on ACE inhibitor  DM, hyperglycemic in ER, A1c7.9  Refusing insulin. Will start metformin.  Hyponatremia, resolved  Tobacco abuse  Counseled against.  No fevers for >24 hours  Code Status: full code Family Communication: patient alone Disposition Plan:  home with PT once therapeutic   Consultants:  Dr. Bridgett Larsson, vascular surgery Procedures:  Aortogram with left leg run off and stenting and subintimal angioplasty of left superficial femoral artery x 2 (Viabahn 5 mm x 100 mm, 5 mm x 50 mm) followed by a left knee popliteal artery turbinectomy, endarterectomy and bovine patch angioplasty, left tibioperoneal trunk thrombectomy, endarterectomy, and bovine patch angioplasty, and left proximal anterior tibial artery endarterectomy Antibiotics:  Cefazolin 5/6 >> 5/9 Doxy  5/9  HPI/Subjective:   Objective: Filed Vitals:   08/18/13 0444 08/18/13 1313 08/18/13 2148 08/19/13 0432  BP: 118/55 122/62 110/60 127/74  Pulse: 76 70 72 73  Temp: 99.3 F (37.4 C) 98.9 F (37.2 C) 98.3 F (36.8 C) 98.4 F (36.9 C)  TempSrc: Oral Oral Oral Oral  Resp: 18 18 20 18   Height:      Weight:      SpO2: 94% 98% 99% 98%    Intake/Output Summary (Last 24 hours) at 08/19/13 1204 Last data filed at 08/19/13 0700  Gross per 24 hour  Intake    600 ml  Output   2800 ml  Net  -2200 ml   Filed Weights   08/16/13 0100 08/16/13 0511 08/17/13 0454  Weight: 81 kg (178 lb 9.2 oz) 80.74 kg (178 lb) 81.5 kg (179 lb 10.8 oz)    Exam: Getting ABIs done   General:  In chair. Appears comfortable  Cardiovascular:  RRR, nl S1, S2, 2/6 systolic murmur  Respiratory:  CTAB, no increased WOB  Abdomen:   NABS, soft, NT/ND Extremities:   Left leg dressing intact and clean. Fourth and fifth toe with dark discoloration on the plantar surface.no erythema warmth    Data Reviewed: Basic Metabolic Panel:  Recent Labs Lab 08/15/13 1233 08/16/13 0528 08/17/13 0514  NA 131* 135* 137  K 3.7 3.9 3.9  CL 98 101 101  CO2 20 22 21   GLUCOSE 181* 157* 121*  BUN 8 7 4*  CREATININE 0.44* 0.46* 0.44*  CALCIUM 8.8 8.5 8.7   Liver Function Tests: No results found for this basename: AST, ALT, ALKPHOS, BILITOT, PROT, ALBUMIN,  in  the last 168 hours No results found for this basename: LIPASE, AMYLASE,  in the last 168 hours No results found for this basename: AMMONIA,  in the last 168 hours CBC:  Recent Labs Lab 08/15/13 1233 08/16/13 0528 08/17/13 0514 08/18/13 0451 08/19/13 0335  WBC 9.6 7.4 11.6* 5.3 4.6  NEUTROABS 6.3  --   --   --   --   HGB 14.6 13.8 13.1 11.9* 12.0  HCT 42.6 40.2 38.2 35.6* 35.8*  MCV 87.3 87.6 87.0 88.1 88.2  PLT 200 201 180 162 171   Cardiac Enzymes:  Recent Labs Lab 08/15/13 1233  CKTOTAL 62   BNP (last 3 results) No results found for  this basename: PROBNP,  in the last 8760 hours CBG:  Recent Labs Lab 08/18/13 1220 08/18/13 1708 08/18/13 2141 08/19/13 0533 08/19/13 1141  GLUCAP 145* 202* 165* 161* 136*    No results found for this or any previous visit (from the past 240 hour(s)).   Studies: No results found. Echo Left ventricle: The cavity size was normal. Wall thickness was increased in a pattern of mild LVH. Systolic function was normal. The estimated ejection fraction was in the range of 60% to 65%. Regional wall motion abnormalities cannot be excluded. Doppler parameters are consistent with abnormal left ventricular relaxation (grade 1 diastolic dysfunction). - Aortic valve: There was mild stenosis. Impressions:  - Technically difficult; LV function is normal; focal wall motion abnormality cannot be excluded; there is mild AS by doppler with peak velocity of 2.7 m/s and mean gradient of 14 mmHg but visually valve appears to open well.  Scheduled Meds: . atorvastatin  80 mg Oral q1800  . docusate sodium  100 mg Oral BID  . doxycycline  100 mg Oral Q12H  . enoxaparin (LOVENOX) injection  80 mg Subcutaneous Q12H  . insulin aspart  0-5 Units Subcutaneous QHS  . insulin aspart  0-9 Units Subcutaneous TID WC  . lisinopril  5 mg Oral Daily  . morphine  15 mg Oral Q12H  . oxyCODONE  10 mg Oral Q4H  . pantoprazole  40 mg Oral Daily  . senna  1 tablet Oral BID  . sodium chloride  3 mL Intravenous Q12H  . warfarin  5 mg Oral ONCE-1800  . Warfarin - Pharmacist Dosing Inpatient   Does not apply q1800   Continuous Infusions:    Time spent: 15 min  Delfina Redwood, MD [facility] Pager [phone]. If 7PM-7AM, please contact night-coverage at [email or web address], password [facility] 08/19/2013, 12:04 PM  LOS: 4 days

## 2013-08-19 NOTE — Progress Notes (Signed)
ANTICOAGULATION CONSULT NOTE - Follow Up Consult  Pharmacy Consult for coumadin and lovenox Indication: arterial embolism of left leg  Allergies  Allergen Reactions  . Fish Allergy   . Ibuprofen     Patient Measurements: Height: 5' 6.93" (170 cm) Weight: 179 lb 10.8 oz (81.5 kg) IBW/kg (Calculated) : 61.44 Heparin Dosing Weight:   Vital Signs: Temp: 98.4 F (36.9 C) (05/10 0432) Temp src: Oral (05/10 0432) BP: 127/74 mmHg (05/10 0432) Pulse Rate: 73 (05/10 0432)  Labs:  Recent Labs  08/16/13 1400 08/17/13 0500  08/17/13 0514 08/18/13 0451 08/19/13 0335  HGB  --   --   < > 13.1 11.9* 12.0  HCT  --   --   --  38.2 35.6* 35.8*  PLT  --   --   --  180 162 171  LABPROT  --   --   --   --  15.0 17.5*  INR  --   --   --   --  1.21 1.48  HEPARINUNFRC <0.10* <0.10*  --   --   --   --   CREATININE  --   --   --  0.44*  --   --   < > = values in this interval not displayed.  Estimated Creatinine Clearance: 99.3 ml/min (by C-G formula based on Cr of 0.44).   Medications:  Scheduled:  . atorvastatin  80 mg Oral q1800  . docusate sodium  100 mg Oral BID  . doxycycline  100 mg Oral Q12H  . enoxaparin (LOVENOX) injection  80 mg Subcutaneous Q12H  . insulin aspart  0-5 Units Subcutaneous QHS  . insulin aspart  0-9 Units Subcutaneous TID WC  . lisinopril  5 mg Oral Daily  . morphine  15 mg Oral Q12H  . oxyCODONE  10 mg Oral Q4H  . pantoprazole  40 mg Oral Daily  . senna  1 tablet Oral BID  . sodium chloride  3 mL Intravenous Q12H  . Warfarin - Pharmacist Dosing Inpatient   Does not apply q1800   Infusions:    Assessment: 43 yo female with arterial embolism of left leg is currently on subthearpeutic coumadin bridging with therapeutic lovenox.  Hgb 12, Plt 171 K stable and INR up nicely to 1.48.  On doxycycline. Goal of Therapy:  Anti-Xa level 0.6-1 units/ml 4hrs after LMWH dose given; INR 2-3 Monitor platelets by anticoagulation protocol: Yes   Plan:  Cont lovenox  80mg  sq q12h CBC every 72 hours Coumadin 5 mg po x1 Daily PT/INR Coumadin educated   Tsz-Yin Saylee Sherrill 08/19/2013,11:09 AM

## 2013-08-19 NOTE — Progress Notes (Signed)
Physical Therapy Treatment Patient Details Name: Toni Logan MRN: [id number] DOB: 06-26-1970 Today's Date: 08/31/2013    History of Present Illness 43 yo female s/p Left below knee popliteal and peroneal thrombectomy; tibial and peroneal endaterectomy with patch angioplasty . Hx of HTN, DM    PT Comments    Patient tolerated ambulation well today. Ambulated in hall with RW, also able to transfer to and from toilet without assist.  Follow Up Recommendations  Home health PT;Supervision for mobility/OOB     Equipment Recommendations  Rolling walker with 5" wheels    Recommendations for Other Services       Precautions / Restrictions Precautions Precautions: Fall Restrictions Weight Bearing Restrictions: No Other Position/Activity Restrictions: pt self-selects NWB LLE    Mobility  Bed Mobility Overal bed mobility: Modified Independent Bed Mobility: Supine to Sit     Supine to sit: Modified independent (Device/Increase time)     General bed mobility comments: HOB flat, very slow, but moves safely  Transfers Overall transfer level: Needs assistance Equipment used: Rolling walker (2 wheeled) Transfers: Sit to/from Stand Sit to Stand: Min guard         General transfer comment: VCs for hand placement and upright posture (performed transfer from bed and toilet)  Ambulation/Gait Ambulation/Gait assistance: Supervision Ambulation Distance (Feet): 90 Feet Assistive device: Rolling walker (2 wheeled) Gait Pattern/deviations: Step-to pattern     General Gait Details: self NWB secondary to pain, but overall mobility not significantly limited   Stairs            Wheelchair Mobility    Modified Rankin (Stroke Patients Only)       Balance                                    Cognition Arousal/Alertness: Awake/alert Behavior During Therapy: Anxious (tearful) Overall Cognitive Status: Within Functional Limits for tasks assessed                       Exercises General Exercises - Lower Extremity Ankle Circles/Pumps: AROM;Left;10 reps;Supine Quad Sets: AROM;Right;5 reps;Supine    General Comments General comments (skin integrity, edema, etc.): educated patient on elevation and heel cord gentle stretch      Pertinent Vitals/Pain Pain 6/10    Home Living                      Prior Function            PT Goals (current goals can now be found in the care plan section) Acute Rehab PT Goals Patient Stated Goal: to decr pain PT Goal Formulation: With patient Time For Goal Achievement: 08/22/13 Potential to Achieve Goals: Good Progress towards PT goals: Progressing toward goals    Frequency  Min 3X/week    PT Plan Current plan remains appropriate    Co-evaluation             End of Session Equipment Utilized During Treatment: Gait belt Activity Tolerance: Patient limited by pain Patient left: in bed;with call bell/phone within reach;with family/visitor present     Time: 1517-6160 PT Time Calculation (min): 22 min  Charges:  $Gait Training: 8-22 mins                    G Codes:      Duncan Dull 08/31/2013, 3:14 PM Alben Deeds, [place] DPT  [phone]

## 2013-08-20 DIAGNOSIS — E785 Hyperlipidemia, unspecified: Secondary | ICD-10-CM | POA: Diagnosis not present

## 2013-08-20 DIAGNOSIS — I798 Other disorders of arteries, arterioles and capillaries in diseases classified elsewhere: Secondary | ICD-10-CM | POA: Diagnosis not present

## 2013-08-20 DIAGNOSIS — F17200 Nicotine dependence, unspecified, uncomplicated: Secondary | ICD-10-CM | POA: Diagnosis not present

## 2013-08-20 DIAGNOSIS — L03119 Cellulitis of unspecified part of limb: Secondary | ICD-10-CM | POA: Diagnosis not present

## 2013-08-20 DIAGNOSIS — I743 Embolism and thrombosis of arteries of the lower extremities: Secondary | ICD-10-CM | POA: Diagnosis not present

## 2013-08-20 DIAGNOSIS — L02619 Cutaneous abscess of unspecified foot: Secondary | ICD-10-CM | POA: Diagnosis not present

## 2013-08-20 DIAGNOSIS — E1159 Type 2 diabetes mellitus with other circulatory complications: Secondary | ICD-10-CM | POA: Diagnosis not present

## 2013-08-20 DIAGNOSIS — I10 Essential (primary) hypertension: Secondary | ICD-10-CM | POA: Diagnosis not present

## 2013-08-20 LAB — GLUCOSE, CAPILLARY
GLUCOSE-CAPILLARY: 213 mg/dL — AB (ref 70–99)
Glucose-Capillary: 168 mg/dL — ABNORMAL HIGH (ref 70–99)

## 2013-08-20 LAB — PROTIME-INR
INR: 1.68 — AB (ref 0.00–1.49)
Prothrombin Time: 19.3 seconds — ABNORMAL HIGH (ref 11.6–15.2)

## 2013-08-20 LAB — CBC
HCT: 37.6 % (ref 36.0–46.0)
Hemoglobin: 12.6 g/dL (ref 12.0–15.0)
MCH: 29.4 pg (ref 26.0–34.0)
MCHC: 33.5 g/dL (ref 30.0–36.0)
MCV: 87.6 fL (ref 78.0–100.0)
Platelets: 195 10*3/uL (ref 150–400)
RBC: 4.29 MIL/uL (ref 3.87–5.11)
RDW: 13.8 % (ref 11.5–15.5)
WBC: 7.1 10*3/uL (ref 4.0–10.5)

## 2013-08-20 MED ORDER — CLOPIDOGREL BISULFATE 75 MG PO TABS
75.0000 mg | ORAL_TABLET | Freq: Every day | ORAL | Status: DC
  Administered 2013-08-20: 75 mg via ORAL
  Filled 2013-08-20 (×2): qty 1

## 2013-08-20 MED ORDER — PROMETHAZINE HCL 12.5 MG PO TABS: 12.5000 mg | ORAL_TABLET | Freq: Four times a day (QID) | ORAL | Status: DC | PRN

## 2013-08-20 MED ORDER — WARFARIN SODIUM 5 MG PO TABS: 5.0000 mg | ORAL_TABLET | Freq: Every day | ORAL | Status: DC

## 2013-08-20 MED ORDER — CLOPIDOGREL BISULFATE 75 MG PO TABS: 75.0000 mg | ORAL_TABLET | Freq: Every day | ORAL | Status: DC

## 2013-08-20 MED ORDER — ATORVASTATIN CALCIUM 80 MG PO TABS: 80.0000 mg | ORAL_TABLET | Freq: Every day | ORAL | Status: DC

## 2013-08-20 MED ORDER — LISINOPRIL 5 MG PO TABS: 5.0000 mg | ORAL_TABLET | Freq: Every day | ORAL | Status: DC

## 2013-08-20 MED ORDER — ENOXAPARIN SODIUM 120 MG/0.8ML ~~LOC~~ SOLN: 120.0000 mg | SUBCUTANEOUS | Status: DC

## 2013-08-20 MED ORDER — OXYCODONE HCL 10 MG PO TABS: 10.0000 mg | ORAL_TABLET | Freq: Four times a day (QID) | ORAL | Status: DC | PRN

## 2013-08-20 MED ORDER — ENOXAPARIN SODIUM 120 MG/0.8ML ~~LOC~~ SOLN
120.0000 mg | SUBCUTANEOUS | Status: DC
  Administered 2013-08-20: 120 mg via SUBCUTANEOUS
  Filled 2013-08-20: qty 0.8

## 2013-08-20 MED ORDER — METFORMIN HCL 500 MG PO TABS: 500.0000 mg | ORAL_TABLET | Freq: Two times a day (BID) | ORAL | Status: DC

## 2013-08-20 NOTE — Progress Notes (Signed)
Pt wanted MD called to get extra dose or early dose of pain medication.  I had explained that IV dilaudid had been discontinued and IV removed.  Pt said she was sick and needed phenergan for nausea and a script. I explained about needing to wait for 3-1 chair for discharge.  Pt did not need RW as she had one from home. Pt/family given discharge instructions, medication lists, follow up appointments, and when to call the doctor.  Pt/family verbalizes understanding. Pt given signs and symptoms of infection. Payton Emerald, RN

## 2013-08-20 NOTE — Progress Notes (Signed)
Occupational Therapy Treatment Patient Details Name: Toni Logan MRN: [id number] DOB: [birth year] Today's Date: 08/20/2013    History of present illness 43 yo female s/p Left below knee popliteal and peroneal thrombectomy; tibial and peroneal endaterectomy with patch angioplasty . Hx of HTN, DM   OT comments  Pt progressing toward d/c goals at this time. Pt remains NWB on LT LE by personal choice. Pt demonstrates ability to complete basic transfer min guard (A). Pt requesting to d/c home today.   Follow Up Recommendations  Home health OT    Equipment Recommendations  3 in 1 bedside comode    Recommendations for Other Services      Precautions / Restrictions Precautions Precautions: Fall Restrictions Other Position/Activity Restrictions: pt self-selects NWB LLE       Mobility Bed Mobility Overal bed mobility: Modified Independent             General bed mobility comments: pt requires HOB elevated and using bil UE to lift LT LE toward EOB.   Transfers Overall transfer level: Needs assistance Equipment used: Rolling walker (2 wheeled) Transfers: Sit to/from Stand Sit to Stand: Min guard         General transfer comment: vc for safety with RW . Pt abandoned RW twice during session at sink holding onto environmental supports instead    Balance Overall balance assessment: Needs assistance         Standing balance support: Bilateral upper extremity supported;During functional activity Standing balance-Leahy Scale: Fair Standing balance comment: pt requires bil UE for support due to NWB LT LE                   ADL Overall ADL's : Needs assistance/impaired Eating/Feeding: Modified independent;Bed level   Grooming: Wash/dry face;Set up;Sitting   Upper Body Bathing: Set up;Sitting   Lower Body Bathing: Set up;Sitting/lateral leans Lower Body Bathing Details (indicate cue type and reason): heavy use of BIL UE for support on sink surface Upper Body  Dressing : Set up;Sitting       Toilet Transfer: Set up;Ambulation;BSC Toilet Transfer Details (indicate cue type and reason): pt using RW to hop to 3n1 at sink level. pt requires use of handles and attempting to pull on sink faucet for sit<>stand         Functional mobility during ADLs: Min guard;Rolling walker (hop to sink level- refuses to put weight on LT LE) General ADL Comments: Pt agreeable to sink level bathing in hopes of progressing home today. pt reports poor pain control through PM hours and wants to go home today. pt encouraged to demonstrate sink level bathing. Pt iniitally requesting bedside and then agreed to sink level. pt holding Lt UE in a flexed position adducted to chest but when provided RW demonstrates full weight bearing. Pt with normal functional use of LT UE during session at sink. pt remains NWB on LT LE due to personal choice and encouraged to let LT LE touch the ground to rest . Pt providing verbal description of why she refuses to put weight on LT LE.       Vision                     Perception     Praxis      Cognition   Behavior During Therapy: Anxious Overall Cognitive Status: Within Functional Limits for tasks assessed  Extremity/Trunk Assessment               Exercises     Shoulder Instructions       General Comments      Pertinent Vitals/ Pain       Reports severe pain Pt premedicated prior to OT session Pt reports poor pain management during PM hours   Home Living                                          Prior Functioning/Environment              Frequency Min 2X/week     Progress Toward Goals  OT Goals(current goals can now be found in the care plan section)  Progress towards OT goals: Progressing toward goals  Acute Rehab OT Goals Patient Stated Goal: to decr pain OT Goal Formulation: With patient Potential to Achieve Goals: Good ADL Goals Pt Will  Perform Grooming: with modified independence;standing Pt Will Perform Upper Body Bathing: with modified independence;standing Pt Will Perform Lower Body Bathing: with modified independence;sit to/from stand Pt Will Perform Upper Body Dressing: with modified independence;standing Pt Will Perform Lower Body Dressing: with modified independence;sit to/from stand Pt Will Transfer to Toilet: with modified independence;bedside commode;ambulating Pt Will Perform Tub/Shower Transfer: Shower transfer;with supervision  Plan Discharge plan remains appropriate    Co-evaluation                 End of Session Equipment Utilized During Treatment: Rolling walker   Activity Tolerance Patient tolerated treatment well;Patient limited by pain   Patient Left in bed;with call bell/phone within reach   Nurse Communication Mobility status;Precautions        Time: 7425-9563 OT Time Calculation (min): 19 min  Charges: OT General Charges $OT Visit: 1 Procedure OT Treatments $Self Care/Home Management : 8-22 mins  Peri Maris 08/20/2013, 9:50 AM Pager: [phone]

## 2013-08-20 NOTE — Progress Notes (Signed)
Pt anxious about pain medications being off schedule and me needing to get her "caught up".  I explained that she was asleep and was not awoken during the night to give.  I gave medications as soon as possible.  Pt sent family member to get me frequently. Each time I went to the room I addressed pt concerns.  Pt felt I was not meeting her needs.  Wanted something for indigestion but refused maalox/mylanta and did not want anything ordered chewable.  I gave protonix early, then soda afterwards per request. Pt resting with call bell within reach.  Will continue to monitor. Payton Emerald, RN

## 2013-08-20 NOTE — Progress Notes (Addendum)
[facility] [place]  Subjective  - She really wants to leave today.     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 Bridgett Larsson emphasized to the patient that she has end-stage atherosclerosis present in her tibial arteries at the age of only 38. She knows she need to have maximal medical management including statin, Plavix, and smoking cessation.  Stenting and subintimal angioplasty of left superficial femoral artery x 2 (Viabahn 5 mm x 100 mm, 5 mm x 50 mm)   Ulyses Amor 08/20/2013 7:56 AM --  Laboratory Lab Results:  Recent Labs  08/19/13 0335 08/20/13 0455  WBC 4.6 7.1  HGB 12.0 12.6  HCT 35.8* 37.6  PLT 171 195   BMET No results found for this basename: NA, K, CL, CO2, GLUCOSE, BUN, CREATININE, CALCIUM,  in the last 72 hours  COAG Lab Results  Component Value Date   INR 1.68* 08/20/2013   INR 1.48 08/19/2013   INR 1.21 08/18/2013   No results found for this basename: PTT    Addendum  I have independently interviewed and examined the patient, and I agree with the physician assistant's findings.  ABI noted.  Triphasic signals in all pedal signals.  Patient needs to be on Coumadin for 3 months to give her the best chance of recannulating her digital arteries.  Additionally for the SFA stent, I would take Plavix 75 mg PO daily.  Pt needs to follow up in the office in 2 weeks for staple removal from the  left calf.  Adele Barthel, MD [facility] [place] Office: [phone] Pager: [phone]  08/20/2013, 9:32 AM

## 2013-08-20 NOTE — Discharge Summary (Signed)
Physician Discharge Summary  ZALA DEGRASSE DGU:[id number] DOB: November 22, 1970 DOA: 08/15/2013  PCP: Dustin Flock, PA-C  Admit date: 08/15/2013 Discharge date: 08/20/2013  Time spent: greater than 30 mintues  Recommendations for Outpatient Follow-up:  1. Monitor INR. Adjust coumadin to keep between 2.0 and 3.0 2. Home PT, RN arranged 3. Monitor BMET while on Ace inhibitor 4. Monitor LFTs while on statin  Discharge Diagnoses:  Primary problem   Arterial embolism of left leg Active problems   Essential hypertension, benign   Hyperlipidemia   Diabetes mellitus with peripheral vascular disease   Tobacco abuse   Cellulitis of left foot Chest pain   Discharge Condition: stable  Filed Weights   08/16/13 0100 08/16/13 0511 08/17/13 0454  Weight: 81 kg (178 lb 9.2 oz) 80.74 kg (178 lb) 81.5 kg (179 lb 10.8 oz)    History of present illness:   43 y.o. year-old female with history of diabetes mellitus, hypertension, hypercholesterolemia, tobacco abuse, chronic leg pain since childhood who presents with progressive pain and discoloration of the left fourth and fifth toes. She has not taken medications in several years because she did not have health insurance. She recently got health insurance in January, but had not gone her to primary doctor to restart medications. The patient was last at their baseline health approximately 5 days ago. She developed some bruising and "ripping" 10/10 pain on the left foot approximately 4-5 days ago which spread to her 4th and 5th toes of the left foot. Movement, bearing weight, and touching toes makes this pain worse. Oral oxycodone 10mg  did not help much. She was seen a [facility] on 5/5 and underwent ABIs which reportedly demonstrated 0.4 on the left and 1.1 on the right. She had duplex which demonstrated mid superior femoral artery occlusion with reconstitution in the mid to distal SFA with prominent collaterals. She was seen by Doctor Ruel Favors, vascular  surgeon, in the ER, who believes this may be an acute embolic event caused by showering of clot from the femoral artery region. She is being admitted to medicine for management of her chronic medical problems.  On ROS, she has had intermittent substernal chest pains without significant SOB, nausea, diaphoresis which occur at rest and with exertion. These pains happen several times per day and have been occurring for several years. She underwent cardiac catheterization 6 years ago which demonstrated non-occlusive CAD of a single vessel.   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 S/p  Left below-the-knee popliteal artery thrombectomy, endarterectomy and bovine patch angioplasty 2. Left tibioperoneal trunk thrombectomy, endarterectomy and bovine patching angioplasty 3. Left proximal anterior tibial artery endarterectomy 4. Left leg runoff Vascular recommends coumadin and lovenox until therapeutic. Also plavix. Patient demands discharge today. Not yet therapeutic, so lovenox and home RN arranged.  Still with ischemic 4th and 5th toes. Pain proved difficult to control.  Suspect chronic opiate use.  Intermittent chest pains, atypical. Echocardiogram shows normal ejection fraction and no definite wall motion abnormalities. No further workup at this time.   HLD: statin started.    Hypertension: started on ACE inhibitor   DM, started on metformin  Hyponatremia, resolved   Tobacco abuse,Counseled against.   Cellulitis of left foot: resolved  Consultants:  Dr. Bridgett Larsson, vascular surgery Procedures:  On 08/16/13 Left below-the-knee popliteal artery thrombectomy, endarterectomy and bovine patch angioplasty Left tibioperoneal  trunk thrombectomy, endarterectomy and bovine patching angioplasty Left proximal anterior tibial artery endarterectomy Left leg  runoff  Discharge Exam: Filed Vitals:   08/20/13 0444  BP: 125/50  Pulse: 70  Temp: 97.9 F (36.6 C)  Resp: 18    General: comfortable Cardiovascular: RRR Respiratory: CTA Ext 4th and 5th toes dark. Pedal pulse palpable. No erythema or warmth  Discharge Instructions You were cared for by a hospitalist during your hospital stay. If you have any questions about your discharge medications or the care you received while you were in the hospital after you are discharged, you can call the unit and asked to speak with the hospitalist on call if the hospitalist that took care of you is not available. Once you are discharged, your primary care physician will handle any further medical issues. Please note that NO REFILLS for any discharge medications will be authorized once you are discharged, as it is imperative that you return to your primary care physician (or establish a relationship with a primary care physician if you do not have one) for your aftercare needs so that they can reassess your need for medications and monitor your lab values.  Discharge Orders   Future Orders Complete By Expires   Diet - low sodium heart healthy  As directed    Diet Carb Modified  As directed    Discharge instructions  As directed    Walker   As directed        Medication List         atorvastatin 80 MG tablet  Commonly known as:  LIPITOR  Take 1 tablet (80 mg total) by mouth daily at 6 PM.     clopidogrel 75 MG tablet  Commonly known as:  PLAVIX  Take 1 tablet (75 mg total) by mouth daily with breakfast.     enoxaparin 120 MG/0.8ML injection  Commonly known as:  LOVENOX  Inject 0.8 mLs (120 mg total) into the skin daily. Until INR greater than or equal to 2.0     lisinopril 5 MG tablet  Commonly known as:  PRINIVIL,ZESTRIL  Take 1 tablet (5 mg total) by mouth daily.     metFORMIN 500 MG tablet  Commonly known as:  GLUCOPHAGE  Take 1 tablet (500 mg total) by mouth 2 (two) times daily with a  meal.     Oxycodone HCl 10 MG Tabs  Take 1 tablet (10 mg total) by mouth every 6 (six) hours as needed (pain).     warfarin 5 MG tablet  Commonly known as:  COUMADIN  Take 1 tablet (5 mg total) by mouth daily. Or as directed      phenergan 12.5 mg po q6h prn nausea  Allergies  Allergen Reactions  . Fish Allergy   . Ibuprofen        Follow-up Information   Follow up with Hinda Lenis, MD. (sent message to office)    Specialty:  Vascular Surgery   Contact information:   [address] [place] [address] [phone]       Follow up with LAND, PHILLIP, PA-C In 1 week. (to check INR/coumadin, blood pressure, diabetes)    Specialty:  Physician Assistant   Contact information:   [address] [place] [address] [phone]        The results of significant diagnostics from this hospitalization (including imaging, microbiology, ancillary and laboratory) are listed below for reference.    Significant Diagnostic Studies: No results found.  Microbiology:  No results found for this or any previous visit (from the past 240 hour(s)).   Labs: Basic Metabolic Panel:  Recent Labs Lab 08/15/13 1233 08/16/13 0528 08/17/13 0514  NA 131* 135* 137  K 3.7 3.9 3.9  CL 98 101 101  CO2 20 22 21   GLUCOSE 181* 157* 121*  BUN 8 7 4*  CREATININE 0.44* 0.46* 0.44*  CALCIUM 8.8 8.5 8.7   Liver Function Tests: No results found for this basename: AST, ALT, ALKPHOS, BILITOT, PROT, ALBUMIN,  in the last 168 hours No results found for this basename: LIPASE, AMYLASE,  in the last 168 hours No results found for this basename: AMMONIA,  in the last 168 hours CBC:  Recent Labs Lab 08/15/13 1233 08/16/13 0528 08/17/13 0514 08/18/13 0451 08/19/13 0335 08/20/13 0455  WBC 9.6 7.4 11.6* 5.3 4.6 7.1  NEUTROABS 6.3  --   --   --   --   --   HGB 14.6 13.8 13.1 11.9* 12.0 12.6  HCT 42.6 40.2 38.2 35.6* 35.8* 37.6  MCV 87.3 87.6 87.0 88.1 88.2 87.6  PLT 200  201 180 162 171 195   Cardiac Enzymes:  Recent Labs Lab 08/15/13 1233  CKTOTAL 62   BNP: BNP (last 3 results) No results found for this basename: PROBNP,  in the last 8760 hours CBG:  Recent Labs Lab 08/19/13 0533 08/19/13 1141 08/19/13 1607 08/19/13 2139 08/20/13 0813  GLUCAP 161* 136* 107* 193* 213*   Signed:  Delfina Redwood, MD [facility] 08/20/2013, 11:04 AM

## 2013-08-20 NOTE — Care Management Note (Signed)
    Page 1 of 2   08/20/2013     4:33:32 PM CARE MANAGEMENT NOTE 08/20/2013  Patient:  Toni Logan, Toni Logan   Account Number:  [id number]  Date Initiated:  08/20/2013  Documentation initiated by:  [facility]  Subjective/Objective Assessment:   Arterial emboli     Action/Plan:   Anticipated DC Date:  08/20/2013   Anticipated DC Plan:  [place]  CM consult      Choice offered to / List presented to:  C-1 Patient   DME arranged  3-N-1      DME agency  [place] arranged  HH-1 RN  [place].   Status of service:  Completed, signed off Medicare Important Message given?   (If response is "NO", the following Medicare IM given date fields will be blank) Date Medicare IM given:   Date Additional Medicare IM given:    Discharge Disposition:  [place]  Per UR Regulation:  Reviewed for med. necessity/level of care/duration of stay  If discussed at [place] of Stay Meetings, dates discussed:    Comments:   08-20-13 1:20pm Luz Lex, [place] [phone] Plan for discharge today.  Set up with 3-n-1 - has walker. Doesn't care what agency - lives in [place] - states has had a family member that had [facility], will go with them.  If unable to provide service states can set up with whoever.  Needs RN for Daily lovenox and INR until INR greater than or equal to 2.0 with [facility] and also PT/OT. Talked with [facility] and have faxed information over.  4:30pm [facility] called back and states her co pays would be more expensive with there agency as it is not a level 1 agency for her.  [employer] company found out agencies in network - still a co pay but less.  Set up with [facility] for RN, PT/OT for INR and lovenox injections daily.

## 2013-08-21 ENCOUNTER — Telehealth: Payer: Self-pay | Admitting: Vascular Surgery

## 2013-08-21 NOTE — Telephone Encounter (Addendum)
Message copied by Doristine Section on Tue Aug 21, 2013  9:30 AM ------      Message from: Peter Minium K      Created: Mon Aug 20, 2013 10:01 AM      Regarding: Schedule       Changed her mind.                ----- Message -----         From: Ulyses Amor, PA-C         Sent: 08/20/2013   9:56 AM           To: Vvs Charge Pool            F/U in 1 week with Dr. Bridgett Larsson not 2 she has staples.       ------ notified patient of post op appt on 08-31-13 at 8:45 am with dr. Bridgett Larsson

## 2013-08-21 NOTE — Clinical Social Work Psych Assess (Signed)
Clinical Social Work Department CLINICAL SOCIAL WORK PSYCHIATRY SERVICE LINE ASSESSMENT 08/20/2013  Patient:  Toni Logan  Account:  [id number]  [place] Date:  08/15/2013  Clinical Social Worker:  Donia Pounds, Latanya Presser  Date/Time:  08/20/2013 01:00 PM Referred by:  RN  Date referred:  08/20/2013 Reason for Referral  Other - See comment  Psychosocial assessment   Presenting Symptoms/Problems (In the person's/family's own words):   PHQ9 score of 12   Abuse/Neglect/Trauma History (check all that apply)  Denies history   Abuse/Neglect/Trauma Comments:   pt denies   Psychiatric History (check all that apply)  Denies history   Psychiatric medications:  none  pt denies   Current Mental Health Hospitalizations/Previous Mental Health History:   none  pt denies   Current provider:   none  pt reports having no mental health services in place   Place and Date:   none   Current Medications:   pt denies being on any medications whether medical or psychotrophic   Previous Impatient Admission/Date/Reason:   this is the first visit to [facility] since 2014   Emotional Health / Current Symptoms    Suicide/Self Harm  None reported   Suicide attempt in the past:   pt denies SI (hx or present); pt denies attempts (hx or present)   Other harmful behavior:   pt denies HI/attempts past and present   Psychotic/Dissociative Symptoms  None reported   Other Psychotic/Dissociative Symptoms:   denies  none exhibited throughout the assessment    Attention/Behavioral Symptoms  Within Normal Limits   Other Attention / Behavioral Symptoms:   none    Cognitive Impairment  Orientation - Place  Orientation - Self  Orientation - Situation  Orientation - Time   Other Cognitive Impairment:   none    Mood and Adjustment  DEPRESSION  Mood Congruent    Stress, Anxiety, Trauma, Any Recent Loss/Stressor  Anxiety   Anxiety (frequency):   Pt reports having a lot of anxiety re: Toni Logan  medical condition.  Pt states this is Toni Logan main trigger for stress   Phobia (specify):   none reported  pt denies   Compulsive behavior (specify):   none reported  pt denies   Obsessive behavior (specify):   none reported  pt denies   Other:   no other stress, anxiety, trauma or recent stressor/loss reported other than Toni Logan medical issues   Substance Abuse/Use  None   SBIRT completed (please refer for detailed history):  N  Self-reported substance use:   untested  pt denies use   Urinary Drug Screen Completed:  N Alcohol level:   untested  pt denies use    Environmental/Housing/Living Arrangement  Stable housing   Who is in the home:   Mardene Celeste, spouse   Emergency contact:  Mardene Celeste, spouse [phone]   Financial  Private Insurance  Stable Employment  Stable Income   Patient's Strengths and Goals (patient's own words):   Pt has private insurance and access to both healthcare and mental healthcare.  Pt has stable employment with [employer]. Pt has steady income and supportive spouse.   Clinical Social Worker's Interpretive Summary:   Psych CSW was consulted for PHQ9 score of 12.  Pt was alert and oriented x4 during the time of this assessment. Pt states that she currently lives at home with Toni Logan spouse and reports Toni Logan triggers for Toni Logan stress/anxiety as being Toni Logan health condition.    Pt states she is here at [facility] due to pain in Toni Logan chest  and in Toni Logan toes/foot.  Pt states that DM is hard to control and often leaves Toni Logan feeling helpless.  Pt states that she has not been prescribed medication for any of Toni Logan illnessess. Pt reports feeling "too young for these health conditions". Pt has been dx with HTN, DM and hypercholesterolemia.  Pt states she had a cath at [facility] in 2010.    pt states that Toni Logan partner provides support to Toni Logan through Toni Logan anxiety.  Pt denied SI/HI past and present.  Pt reports no AVHD and none were present upon this assessment.    Pt states that she  has never received MH services neither OP or IP.    Psych CSW provided psychoeducation on anxiety and depression along with smoking cessation education.  Pt was willing and receptive to receive this information and provided exhibition of knowledge received through teach back.  Pt did not feel as if she needed to talk to a counselor on an outpatient basis at this time.  Psych CSW reviewed Toni Logan insurance information with Toni Logan and educaited pt on how to access mental health services through [place].  Pt acknowledged understanding.    Psych CSW signing off.   Disposition:  Psych Clinical Social Worker signing off

## 2013-08-23 NOTE — Addendum Note (Signed)
Addendum created 08/23/13 3606 by Roberts Gaudy, MD   Modules edited: Anesthesia Attestations

## 2013-08-30 ENCOUNTER — Encounter: Payer: Self-pay | Admitting: Vascular Surgery

## 2013-08-31 ENCOUNTER — Telehealth: Payer: Self-pay

## 2013-08-31 ENCOUNTER — Ambulatory Visit (INDEPENDENT_AMBULATORY_CARE_PROVIDER_SITE_OTHER): Payer: Self-pay | Admitting: Vascular Surgery

## 2013-08-31 ENCOUNTER — Encounter: Payer: Self-pay | Admitting: Vascular Surgery

## 2013-08-31 VITALS — BP 127/71 | HR 70 | Temp 98.2°F | Ht 66.9 in | Wt 170.4 lb

## 2013-08-31 DIAGNOSIS — I824Z9 Acute embolism and thrombosis of unspecified deep veins of unspecified distal lower extremity: Secondary | ICD-10-CM

## 2013-08-31 DIAGNOSIS — I743 Embolism and thrombosis of arteries of the lower extremities: Secondary | ICD-10-CM

## 2013-08-31 DIAGNOSIS — I739 Peripheral vascular disease, unspecified: Secondary | ICD-10-CM

## 2013-08-31 HISTORY — DX: Acute embolism and thrombosis of unspecified deep veins of unspecified distal lower extremity: I82.4Z9

## 2013-08-31 NOTE — Addendum Note (Signed)
Addended by: Mena Goes on: 08/31/2013 11:19 AM   Modules accepted: Orders

## 2013-08-31 NOTE — Progress Notes (Signed)
    Postoperative Visit   History of Present Illness  Toni Logan is a 43 y.o. year old female who presents for postoperative follow-up for:  1.  L SFA PTA+S x 2 2.  L BK pop TE, EA, BPA 3. TPT TE, EA, BPA 4. Prox AT EA (Date: 08/16/13).  The patient notes improvement of lower extremity symptoms.  The patient is able to complete their activities of daily living.  The patient's current symptoms are: L foot pain.  For VQI Use Only  PRE-ADM LIVING: Home  AMB STATUS: Ambulatory  Physical Examination  Filed Vitals:   08/31/13 0907  BP: 127/71  Pulse: 70  Temp: 98.2 F (36.8 C)   LLE: calf incision is healed, staples in place, palpable PT, edema 1+, L 5th toe with some ischemic skin, L 4th toe with ischemic tissue in distal phalange Medical Decision Making  Toni Logan is a 43 y.o. year old female who presents s/p L SFA; L BK pop TE, EA, BPA; TPT TE , EA; BPA, prox AT EA for thromboembolism to L foot from L SFA disease, accelerated atherosclerosis in multiple vessel beds  The patient's bypass incisions are healing appropriately with improvement of pre-operative symptoms. I discussed in depth with the patient the nature of atherosclerosis, and emphasized the importance of maximal medical management including strict control of blood pressure, blood glucose, and lipid levels, obtaining regular exercise, and cessation of smoking.  The patient is aware that without maximal medical management the underlying atherosclerotic disease process will progress, limiting the benefit of any interventions. The patient's surveillance will included ABI and L arterial duplex studies which will be completed in: 3 months, at which time the patient will be re-evaluated.   I emphasized the importance of routine surveillance of the patient's bypass, as the vascular surgery literature emphasize the improved patency possible with assisted primary patency procedures versus secondary patency procedures. The  patient agrees to participate in their maximal medical care and routine surveillance. The patient is currently on a statin: Lipitor The patient is currently on an anti-platelet: Plavix. The patient is also on Warfarin for TE phenomena to L foot.  Thank you for allowing [place] to participate in this patient's care.  Toni Barthel, MD [facility] [place] Office: [phone] Pager: [phone]  08/31/2013, 9:07 AM

## 2013-08-31 NOTE — Telephone Encounter (Signed)
Phone call from pt.  Stated she is on a blood thinner.  Reported while at office today, had some bloody oozing while she was having staples removed from left lower leg incision.  Stated the bleeding has become more steady.  Has had help from friend and changed her gauze bandage.  Reported that the bleeding has come through the dressing.  Questioned if incision is still intact; stated the incision is intact, but has noted 3 separate areas of blood oozing through the incision line.  Advised pt's friend, Wannetta Sender, to have pt. recline with left leg elevated on pillows, and apply direct, gentle, steady pressure over the individual areas that are bleeding.  Verb. Understanding.  Advised Dr. Bridgett Larsson of pt's. Report of bleeding.  No new orders given; agrees with advice given to pt.  Call returned to pt.  Jerl Mina, that Dr. Bridgett Larsson is aware of pt's symptoms, and agrees with direct pressure being applied to incisional areas that are bleeding.  Will call pt. in one hour to follow up.  Trish verb. understanding.

## 2013-08-31 NOTE — Telephone Encounter (Signed)
Phone call returned to pt.  Stated she has continued to rest with left leg elevated.  Reported that after direct pressure had been applied for extended period, no further bleeding was noted.  Encouraged to leave dsg. Intact today, and when ready to shower over weekend, to moisten the dressing before gently removing it.  Advised to go to the ER if bleeding persists over the weekend.  Verb. Understanding.

## 2013-09-10 DIAGNOSIS — Z0279 Encounter for issue of other medical certificate: Secondary | ICD-10-CM

## 2013-11-29 ENCOUNTER — Encounter: Payer: Self-pay | Admitting: Vascular Surgery

## 2013-11-30 ENCOUNTER — Ambulatory Visit: Payer: No Typology Code available for payment source | Admitting: Vascular Surgery

## 2013-11-30 ENCOUNTER — Encounter (HOSPITAL_COMMUNITY): Payer: No Typology Code available for payment source

## 2013-11-30 ENCOUNTER — Inpatient Hospital Stay (HOSPITAL_COMMUNITY): Admission: RE | Admit: 2013-11-30 | Payer: No Typology Code available for payment source | Source: Ambulatory Visit

## 2013-12-11 DIAGNOSIS — Z48812 Encounter for surgical aftercare following surgery on the circulatory system: Secondary | ICD-10-CM

## 2013-12-11 DIAGNOSIS — I10 Essential (primary) hypertension: Secondary | ICD-10-CM | POA: Diagnosis not present

## 2013-12-11 DIAGNOSIS — I029 Rheumatic chorea without heart involvement: Secondary | ICD-10-CM

## 2013-12-11 DIAGNOSIS — I2510 Atherosclerotic heart disease of native coronary artery without angina pectoris: Secondary | ICD-10-CM | POA: Diagnosis not present

## 2013-12-11 DIAGNOSIS — E119 Type 2 diabetes mellitus without complications: Secondary | ICD-10-CM | POA: Diagnosis not present

## 2013-12-12 ENCOUNTER — Encounter: Payer: Self-pay | Admitting: Vascular Surgery

## 2013-12-13 ENCOUNTER — Ambulatory Visit (HOSPITAL_COMMUNITY)
Admission: RE | Admit: 2013-12-13 | Discharge: 2013-12-13 | Disposition: A | Discharge status: Home / Self Care | Payer: Medicaid Other | Source: Ambulatory Visit | Attending: Vascular Surgery | Admitting: Vascular Surgery

## 2013-12-13 ENCOUNTER — Ambulatory Visit (INDEPENDENT_AMBULATORY_CARE_PROVIDER_SITE_OTHER)
Admission: RE | Admit: 2013-12-13 | Discharge: 2013-12-13 | Disposition: A | Discharge status: Home / Self Care | Payer: Medicaid Other | Source: Ambulatory Visit | Attending: Vascular Surgery | Admitting: Vascular Surgery

## 2013-12-13 ENCOUNTER — Ambulatory Visit (INDEPENDENT_AMBULATORY_CARE_PROVIDER_SITE_OTHER): Payer: Medicaid Other | Admitting: Vascular Surgery

## 2013-12-13 ENCOUNTER — Encounter: Payer: Self-pay | Admitting: Vascular Surgery

## 2013-12-13 VITALS — BP 117/71 | HR 60 | Ht 66.9 in | Wt 157.0 lb

## 2013-12-13 DIAGNOSIS — I743 Embolism and thrombosis of arteries of the lower extremities: Secondary | ICD-10-CM

## 2013-12-13 DIAGNOSIS — I749 Embolism and thrombosis of unspecified artery: Secondary | ICD-10-CM | POA: Diagnosis not present

## 2013-12-13 DIAGNOSIS — I739 Peripheral vascular disease, unspecified: Secondary | ICD-10-CM | POA: Insufficient documentation

## 2013-12-13 HISTORY — DX: Embolism and thrombosis of unspecified artery: I74.9

## 2013-12-13 NOTE — Progress Notes (Signed)
    Established Critical Limb Ischemia Patient  History of Present Illness  Toni Logan is a 43 y.o. (Jun 02, 1970) female who presents with chief complaint: "do I need to keep taking coumadin?".  This patient underwent on 08/16/13: 1. L SFA PTA+S x 2  2. L BK pop TE, EA, BPA  3. TPT TE, EA, BPA  4. Prox AT EA   The patient notes symptoms have resolved.  The patient's treatment regimen currently included: maximal medical management.  Patient admits suboptimal DM control and continues smoking.  Pt notes she autoamputated part of her L 4th and 5th toes.  The patient's PMH, PSH, SH, FamHx, Med, and Allergies are unchanged from 08/31/13.  On ROS today: no claudication or rest pain, toe ischemia resolved  Physical Examination  Filed Vitals:   12/13/13 1556  BP: 117/71  Pulse: 60  Height: 5' 6.9" (1.699 m)  Weight: 157 lb (71.215 kg)  SpO2: 98%   Body mass index is 24.67 kg/(m^2).  General: A&O x 3, WDWN  Eyes: PERRLA, EOMI  Pulmonary: Sym exp, good air movt, CTAB, no rales, rhonchi, & wheezing  Cardiac: RRR, Nl S1, S2, no Murmurs, rubs or gallops  Vascular: Vessel Right Left  Radial Palpable Palpable  Brachial Palpable Palpable  Carotid Palpable, without bruit Palpable, without bruit  Aorta Not palpable N/A  Femoral Palpable Palpable  Popliteal Not palpable Not palpable  PT Palpable Palpable  DP Palpable Not Palpable   Gastrointestinal: soft, NTND, -G/R, - HSM, - masses, - CVAT B  Musculoskeletal: M/S 5/5 throughout , Extremities without ischemic changes , healed L 4th and 5th toes: slightly shortened from other toes  Neurologic: Pain and light touch intact in extremities , Motor exam as listed above  Non-Invasive Vascular Imaging ABI (Date: 12/13/2013)  R: 1.01 (1.1), AT: bi, PT: tri, TBI: 0.73  L: 0.93 (1.12), AT: mono, PT: tri, TBI: 0.45  LLE arterial duplex (12/13/2013)  Patent stent with triphasic flow throughout  Increase PSV at end of stent 318 c/s (ratio  3.4)  Medical Decision Making  Toni Logan is a 43 y.o. female who presents with: prior episode of L foot thromboembolism likely from a L SFA plaque rupture that was treated with L SIA+S which was complicated by thromboembolism which required thrombectomy   Based on the patient's vascular studies and examination, I have offered the patient: continued surveillance in 3 months.  Pt needs ABI and LLE duplex.  If the velocity increases in the L SFA distal to the stent, reintervention may be needed.  But the PSV may also be due to exit from stent, so I would recheck in 3 months rather than intervening immediately.    I discussed in depth with the patient the nature of atherosclerosis, and emphasized the importance of maximal medical management including strict control of blood pressure, blood glucose, and lipid levels, antiplatelet agents, obtaining regular exercise, and cessation of smoking.    The patient is aware that without maximal medical management the underlying atherosclerotic disease process will progress, limiting the benefit of any interventions. The patient is currently on a statin: Lipitor. The patient is currently on an anti-platelet: Plavix. The patient can STOP anticoagulation as her toes have healed at this point.  Thank you for allowing [place] to participate in this patient's care.  Adele Barthel, MD [facility] [place] Office: [phone] Pager: [phone]  12/13/2013, 4:27 PM

## 2013-12-14 ENCOUNTER — Encounter (HOSPITAL_COMMUNITY): Payer: PRIVATE HEALTH INSURANCE

## 2013-12-14 ENCOUNTER — Ambulatory Visit: Payer: Self-pay | Admitting: Vascular Surgery

## 2013-12-14 ENCOUNTER — Other Ambulatory Visit (HOSPITAL_COMMUNITY): Payer: Self-pay

## 2013-12-14 NOTE — Addendum Note (Signed)
Addended by: Mena Goes on: 12/14/2013 05:02 PM   Modules accepted: Orders

## 2014-01-02 DIAGNOSIS — Z0279 Encounter for issue of other medical certificate: Secondary | ICD-10-CM | POA: Diagnosis not present

## 2014-03-21 ENCOUNTER — Encounter: Payer: Self-pay | Admitting: Vascular Surgery

## 2014-03-21 ENCOUNTER — Encounter (HOSPITAL_COMMUNITY): Payer: Self-pay | Admitting: Vascular Surgery

## 2014-03-22 ENCOUNTER — Encounter (HOSPITAL_COMMUNITY): Payer: Self-pay

## 2014-03-22 ENCOUNTER — Other Ambulatory Visit (HOSPITAL_COMMUNITY): Payer: Self-pay

## 2014-03-22 ENCOUNTER — Ambulatory Visit: Payer: Self-pay | Admitting: Vascular Surgery

## 2014-03-22 NOTE — Progress Notes (Signed)
Phone call from pt.  Reported she missed her appt. today due to no transportation, no income, and no insurance.  Questioned how imperative her appt. is?  Advised it is important to f/u with her surveillance labs to check patency of stents and blood flow in legs.  Stated she really has no other resources at this time, and may have to resort to going through the hospital if she has problems.  Stated she wanted the office to know that she didn't deliberately not show up for her appt.  Advised she may want to check in to the Internal Medicine Outpatient Clinic at [facility].  Pt. Stated she lives in [place] and would have no transportation.

## 2014-04-02 ENCOUNTER — Encounter: Payer: Self-pay | Admitting: Family

## 2014-04-03 ENCOUNTER — Ambulatory Visit (HOSPITAL_COMMUNITY)
Admission: RE | Admit: 2014-04-03 | Discharge: 2014-04-03 | Disposition: A | Discharge status: Home / Self Care | Payer: Medicaid Other | Source: Ambulatory Visit | Attending: Family | Admitting: Family

## 2014-04-03 ENCOUNTER — Ambulatory Visit (INDEPENDENT_AMBULATORY_CARE_PROVIDER_SITE_OTHER)
Admission: RE | Admit: 2014-04-03 | Discharge: 2014-04-03 | Disposition: A | Discharge status: Home / Self Care | Payer: Medicaid Other | Source: Ambulatory Visit | Attending: Family | Admitting: Family

## 2014-04-03 ENCOUNTER — Encounter: Payer: Self-pay | Admitting: Family

## 2014-04-03 ENCOUNTER — Ambulatory Visit (INDEPENDENT_AMBULATORY_CARE_PROVIDER_SITE_OTHER): Payer: Medicaid Other | Admitting: Family

## 2014-04-03 VITALS — BP 163/91 | HR 92 | Temp 97.9°F | Resp 16 | Ht 66.0 in | Wt 158.0 lb

## 2014-04-03 DIAGNOSIS — I743 Embolism and thrombosis of arteries of the lower extremities: Secondary | ICD-10-CM

## 2014-04-03 DIAGNOSIS — E1165 Type 2 diabetes mellitus with hyperglycemia: Secondary | ICD-10-CM | POA: Diagnosis not present

## 2014-04-03 DIAGNOSIS — E1140 Type 2 diabetes mellitus with diabetic neuropathy, unspecified: Secondary | ICD-10-CM | POA: Diagnosis not present

## 2014-04-03 DIAGNOSIS — F17200 Nicotine dependence, unspecified, uncomplicated: Secondary | ICD-10-CM

## 2014-04-03 DIAGNOSIS — R209 Unspecified disturbances of skin sensation: Secondary | ICD-10-CM

## 2014-04-03 DIAGNOSIS — E782 Mixed hyperlipidemia: Secondary | ICD-10-CM | POA: Diagnosis not present

## 2014-04-03 DIAGNOSIS — M79662 Pain in left lower leg: Secondary | ICD-10-CM

## 2014-04-03 DIAGNOSIS — T82858D Stenosis of vascular prosthetic devices, implants and grafts, subsequent encounter: Secondary | ICD-10-CM

## 2014-04-03 DIAGNOSIS — Z720 Tobacco use: Secondary | ICD-10-CM | POA: Diagnosis not present

## 2014-04-03 DIAGNOSIS — IMO0002 Reserved for concepts with insufficient information to code with codable children: Secondary | ICD-10-CM

## 2014-04-03 HISTORY — DX: Unspecified disturbances of skin sensation: R20.9

## 2014-04-03 HISTORY — DX: Pain in left lower leg: M79.662

## 2014-04-03 MED ORDER — TRAMADOL HCL 50 MG PO TABS: 50.0000 mg | ORAL_TABLET | Freq: Four times a day (QID) | ORAL | Status: DC | PRN

## 2014-04-03 MED ORDER — ATORVASTATIN CALCIUM 80 MG PO TABS
80.0000 mg | ORAL_TABLET | Freq: Every day | ORAL | Status: AC
Start: 2014-04-03 — End: ?

## 2014-04-03 NOTE — Patient Instructions (Addendum)
Peripheral Vascular Disease Peripheral Vascular Disease (PVD), also called Peripheral Arterial Disease (PAD), is a circulation problem caused by cholesterol (atherosclerotic plaque) deposits in the arteries. PVD commonly occurs in the lower extremities (legs) but it can occur in other areas of the body, such as your arms. The cholesterol buildup in the arteries reduces blood flow which can cause pain and other serious problems. The presence of PVD can place a person at risk for Coronary Artery Disease (CAD).  CAUSES  Causes of PVD can be many. It is usually associated with more than one risk factor such as:   High Cholesterol.  Smoking.  Diabetes.  Lack of exercise or inactivity.  High blood pressure (hypertension).  Obesity.  Family history. SYMPTOMS   When the lower extremities are affected, patients with PVD may experience:  Leg pain with exertion or physical activity. This is called INTERMITTENT CLAUDICATION. This may present as cramping or numbness with physical activity. The location of the pain is associated with the level of blockage. For example, blockage at the abdominal level (distal abdominal aorta) may result in buttock or hip pain. Lower leg arterial blockage may result in calf pain.  As PVD becomes more severe, pain can develop with less physical activity.  In people with severe PVD, leg pain may occur at rest.  Other PVD signs and symptoms:  Leg numbness or weakness.  Coldness in the affected leg or foot, especially when compared to the other leg.  A change in leg color.  Patients with significant PVD are more prone to ulcers or sores on toes, feet or legs. These may take longer to heal or may reoccur. The ulcers or sores can become infected.  If signs and symptoms of PVD are ignored, gangrene may occur. This can result in the loss of toes or loss of an entire limb.  Not all leg pain is related to PVD. Other medical conditions can cause leg pain such  as:  Blood clots (embolism) or Deep Vein Thrombosis.  Inflammation of the blood vessels (vasculitis).  Spinal stenosis. DIAGNOSIS  Diagnosis of PVD can involve several different types of tests. These can include:  Pulse Volume Recording Method (PVR). This test is simple, painless and does not involve the use of X-rays. PVR involves measuring and comparing the blood pressure in the arms and legs. An ABI (Ankle-Brachial Index) is calculated. The normal ratio of blood pressures is 1. As this number becomes smaller, it indicates more severe disease.  < 0.95 - indicates significant narrowing in one or more leg vessels.  <0.8 - there will usually be pain in the foot, leg or buttock with exercise.  <0.4 - will usually have pain in the legs at rest.  <0.25 - usually indicates limb threatening PVD.  Doppler detection of pulses in the legs. This test is painless and checks to see if you have a pulses in your legs/feet.  A dye or contrast material (a substance that highlights the blood vessels so they show up on x-ray) may be given to help your caregiver better see the arteries for the following tests. The dye is eliminated from your body by the kidney's. Your caregiver may order blood work to check your kidney function and other laboratory values before the following tests are performed:  Magnetic Resonance Angiography (MRA). An MRA is a picture study of the blood vessels and arteries. The MRA machine uses a large magnet to produce images of the blood vessels.  Computed Tomography Angiography (CTA). A CTA   is a specialized x-ray that looks at how the blood flows in your blood vessels. An IV may be inserted into your arm so contrast dye can be injected.  Angiogram. Is a procedure that uses x-rays to look at your blood vessels. This procedure is minimally invasive, meaning a small incision (cut) is made in your groin. A small tube (catheter) is then inserted into the artery of your groin. The catheter  is guided to the blood vessel or artery your caregiver wants to examine. Contrast dye is injected into the catheter. X-rays are then taken of the blood vessel or artery. After the images are obtained, the catheter is taken out. TREATMENT  Treatment of PVD involves many interventions which may include:  Lifestyle changes:  Quitting smoking.  Exercise.  Following a low fat, low cholesterol diet.  Control of diabetes.  Foot care is very important to the PVD patient. Good foot care can help prevent infection.  Medication:  Cholesterol-lowering medicine.  Blood pressure medicine.  Anti-platelet drugs.  Certain medicines may reduce symptoms of Intermittent Claudication.  Interventional/Surgical options:  Angioplasty. An Angioplasty is a procedure that inflates a balloon in the blocked artery. This opens the blocked artery to improve blood flow.  Stent Implant. A wire mesh tube (stent) is placed in the artery. The stent expands and stays in place, allowing the artery to remain open.  Peripheral Bypass Surgery. This is a surgical procedure that reroutes the blood around a blocked artery to help improve blood flow. This type of procedure may be performed if Angioplasty or stent implants are not an option. SEEK IMMEDIATE MEDICAL CARE IF:   You develop pain or numbness in your arms or legs.  Your arm or leg turns cold, becomes blue in color.  You develop redness, warmth, swelling and pain in your arms or legs. MAKE SURE YOU:   Understand these instructions.  Will watch your condition.  Will get help right away if you are not doing well or get worse. Document Released: 05/06/2004 Document Revised: 06/21/2011 Document Reviewed: 04/02/2008 Exit[employer] Patient Information 2015 [employer]. This information is not intended to replace advice given to you by your health care provider. Make sure you discuss any questions you have with your health care provider.    Smoking  Cessation Quitting smoking is important to your health and has many advantages. However, it is not always easy to quit since nicotine is a very addictive drug. Oftentimes, people try 3 times or more before being able to quit. This document explains the best ways for you to prepare to quit smoking. Quitting takes hard work and a lot of effort, but you can do it. ADVANTAGES OF QUITTING SMOKING  You will live longer, feel better, and live better.  Your body will feel the impact of quitting smoking almost immediately.  Within 20 minutes, blood pressure decreases. Your pulse returns to its normal level.  After 8 hours, carbon monoxide levels in the blood return to normal. Your oxygen level increases.  After 24 hours, the chance of having a heart attack starts to decrease. Your breath, hair, and body stop smelling like smoke.  After 48 hours, damaged nerve endings begin to recover. Your sense of taste and smell improve.  After 72 hours, the body is virtually free of nicotine. Your bronchial tubes relax and breathing becomes easier.  After 2 to 12 weeks, lungs can hold more air. Exercise becomes easier and circulation improves.  The risk of having a heart attack, stroke,   cancer, or lung disease is greatly reduced.  After 1 year, the risk of coronary heart disease is cut in half.  After 5 years, the risk of stroke falls to the same as a nonsmoker.  After 10 years, the risk of lung cancer is cut in half and the risk of other cancers decreases significantly.  After 15 years, the risk of coronary heart disease drops, usually to the level of a nonsmoker.  If you are pregnant, quitting smoking will improve your chances of having a healthy baby.  The people you live with, especially any children, will be healthier.  You will have extra money to spend on things other than cigarettes. QUESTIONS TO THINK ABOUT BEFORE ATTEMPTING TO QUIT You may want to talk about your answers with your health care  provider.  Why do you want to quit?  If you tried to quit in the past, what helped and what did not?  What will be the most difficult situations for you after you quit? How will you plan to handle them?  Who can help you through the tough times? Your family? Friends? A health care provider?  What pleasures do you get from smoking? What ways can you still get pleasure if you quit? Here are some questions to ask your health care provider:  How can you help me to be successful at quitting?  What medicine do you think would be best for me and how should I take it?  What should I do if I need more help?  What is smoking withdrawal like? How can I get information on withdrawal? GET READY  Set a quit date.  Change your environment by getting rid of all cigarettes, ashtrays, matches, and lighters in your home, car, or work. Do not let people smoke in your home.  Review your past attempts to quit. Think about what worked and what did not. GET SUPPORT AND ENCOURAGEMENT You have a better chance of being successful if you have help. You can get support in many ways.  Tell your family, friends, and coworkers that you are going to quit and need their support. Ask them not to smoke around you.  Get individual, group, or telephone counseling and support. Programs are available at local hospitals and health centers. Call your local health department for information about programs in your area.  Spiritual beliefs and practices may help some smokers quit.  Download a "quit meter" on your computer to keep track of quit statistics, such as how long you have gone without smoking, cigarettes not smoked, and money saved.  Get a self-help book about quitting smoking and staying off tobacco. LEARN NEW SKILLS AND BEHAVIORS  Distract yourself from urges to smoke. Talk to someone, go for a walk, or occupy your time with a task.  Change your normal routine. Take a different route to work. Drink tea  instead of coffee. Eat breakfast in a different place.  Reduce your stress. Take a hot bath, exercise, or read a book.  Plan something enjoyable to do every day. Reward yourself for not smoking.  Explore interactive web-based programs that specialize in helping you quit. GET MEDICINE AND USE IT CORRECTLY Medicines can help you stop smoking and decrease the urge to smoke. Combining medicine with the above behavioral methods and support can greatly increase your chances of successfully quitting smoking.  Nicotine replacement therapy helps deliver nicotine to your body without the negative effects and risks of smoking. Nicotine replacement therapy includes nicotine gum, lozenges,   inhalers, nasal sprays, and skin patches. Some may be available over-the-counter and others require a prescription.  Antidepressant medicine helps people abstain from smoking, but how this works is unknown. This medicine is available by prescription.  Nicotinic receptor partial agonist medicine simulates the effect of nicotine in your brain. This medicine is available by prescription. Ask your health care provider for advice about which medicines to use and how to use them based on your health history. Your health care provider will tell you what side effects to look out for if you choose to be on a medicine or therapy. Carefully read the information on the package. Do not use any other product containing nicotine while using a nicotine replacement product.  RELAPSE OR DIFFICULT SITUATIONS Most relapses occur within the first 3 months after quitting. Do not be discouraged if you start smoking again. Remember, most people try several times before finally quitting. You may have symptoms of withdrawal because your body is used to nicotine. You may crave cigarettes, be irritable, feel very hungry, cough often, get headaches, or have difficulty concentrating. The withdrawal symptoms are only temporary. They are strongest when you  first quit, but they will go away within 10-14 days. To reduce the chances of relapse, try to:  Avoid drinking alcohol. Drinking lowers your chances of successfully quitting.  Reduce the amount of caffeine you consume. Once you quit smoking, the amount of caffeine in your body increases and can give you symptoms, such as a rapid heartbeat, sweating, and anxiety.  Avoid smokers because they can make you want to smoke.  Do not let weight gain distract you. Many smokers will gain weight when they quit, usually less than 10 pounds. Eat a healthy diet and stay active. You can always lose the weight gained after you quit.  Find ways to improve your mood other than smoking. FOR MORE INFORMATION  www.smokefree.gov  Document Released: 03/23/2001 Document Revised: 08/13/2013 Document Reviewed: 07/08/2011 [employer] Patient Information 2015 [employer]. This information is not intended to replace advice given to you by your health care provider. Make sure you discuss any questions you have with your health care provider.    Smoking Cessation, Tips for Success If you are ready to quit smoking, congratulations! You have chosen to help yourself be healthier. Cigarettes bring nicotine, tar, carbon monoxide, and other irritants into your body. Your lungs, heart, and blood vessels will be able to work better without these poisons. There are many different ways to quit smoking. Nicotine gum, nicotine patches, a nicotine inhaler, or nicotine nasal spray can help with physical craving. Hypnosis, support groups, and medicines help break the habit of smoking. WHAT THINGS CAN I DO TO MAKE QUITTING EASIER?  Here are some tips to help you quit for good:  Pick a date when you will quit smoking completely. Tell all of your friends and family about your plan to quit on that date.  Do not try to slowly cut down on the number of cigarettes you are smoking. Pick a quit date and quit smoking completely starting on that  day.  Throw away all cigarettes.   Clean and remove all ashtrays from your home, work, and car.  On a card, write down your reasons for quitting. Carry the card with you and read it when you get the urge to smoke.  Cleanse your body of nicotine. Drink enough water and fluids to keep your urine clear or pale yellow. Do this after quitting to flush the nicotine from   your body.  Learn to predict your moods. Do not let a bad situation be your excuse to have a cigarette. Some situations in your life might tempt you into wanting a cigarette.  Never have "just one" cigarette. It leads to wanting another and another. Remind yourself of your decision to quit.  Change habits associated with smoking. If you smoked while driving or when feeling stressed, try other activities to replace smoking. Stand up when drinking your coffee. Brush your teeth after eating. Sit in a different chair when you read the paper. Avoid alcohol while trying to quit, and try to drink fewer caffeinated beverages. Alcohol and caffeine may urge you to smoke.  Avoid foods and drinks that can trigger a desire to smoke, such as sugary or spicy foods and alcohol.  Ask people who smoke not to smoke around you.  Have something planned to do right after eating or having a cup of coffee. For example, plan to take a walk or exercise.  Try a relaxation exercise to calm you down and decrease your stress. Remember, you may be tense and nervous for the first 2 weeks after you quit, but this will pass.  Find new activities to keep your hands busy. Play with a pen, coin, or rubber band. Doodle or draw things on paper.  Brush your teeth right after eating. This will help cut down on the craving for the taste of tobacco after meals. You can also try mouthwash.   Use oral substitutes in place of cigarettes. Try using lemon drops, carrots, cinnamon sticks, or chewing gum. Keep them handy so they are available when you have the urge to  smoke.  When you have the urge to smoke, try deep breathing.  Designate your home as a nonsmoking area.  If you are a heavy smoker, ask your health care provider about a prescription for nicotine chewing gum. It can ease your withdrawal from nicotine.  Reward yourself. Set aside the cigarette money you save and buy yourself something nice.  Look for support from others. Join a support group or smoking cessation program. Ask someone at home or at work to help you with your plan to quit smoking.  Always ask yourself, "Do I need this cigarette or is this just a reflex?" Tell yourself, "Today, I choose not to smoke," or "I do not want to smoke." You are reminding yourself of your decision to quit.  Do not replace cigarette smoking with electronic cigarettes (commonly called e-cigarettes). The safety of e-cigarettes is unknown, and some may contain harmful chemicals.  If you relapse, do not give up! Plan ahead and think about what you will do the next time you get the urge to smoke. HOW WILL I FEEL WHEN I QUIT SMOKING? You may have symptoms of withdrawal because your body is used to nicotine (the addictive substance in cigarettes). You may crave cigarettes, be irritable, feel very hungry, cough often, get headaches, or have difficulty concentrating. The withdrawal symptoms are only temporary. They are strongest when you first quit but will go away within 10-14 days. When withdrawal symptoms occur, stay in control. Think about your reasons for quitting. Remind yourself that these are signs that your body is healing and getting used to being without cigarettes. Remember that withdrawal symptoms are easier to treat than the major diseases that smoking can cause.  Even after the withdrawal is over, expect periodic urges to smoke. However, these cravings are generally short lived and will go away whether you   smoke or not. Do not smoke! WHAT RESOURCES ARE AVAILABLE TO HELP ME QUIT SMOKING? Your health care  provider can direct you to community resources or hospitals for support, which may include:  Group support.  Education.  Hypnosis.  Therapy. Document Released: 12/26/2003 Document Revised: 08/13/2013 Document Reviewed: 09/14/2012 [employer] Patient Information 2015 [employer]. This information is not intended to replace advice given to you by your health care provider. Make sure you discuss any questions you have with your health care provider.  

## 2014-04-03 NOTE — Progress Notes (Signed)
[facility] [place] HISTORY AND PHYSICAL -PAD  History of Present Illness Toni Logan is a 43 y.o. female patient of Dr. Bridgett Larsson who is s/p left superficial femoral artery stent with thrombectomy and angioplasty of popliteal artery and PT trunk and endarterectomy of proximal anterior tibial artery on 08/16/2013.  The patient's treatment regimen currently included: maximal medical management. Patient admits suboptimal DM control and continues smoking. Pt notes she autoamputated part of her L 4th and 5th toes She returns today with c/o pain in left foot to thigh, worsening over time, hurt before the vascular procedure, aggravated with walking, hurts at rest. Noticed left metatarsal prominence with an ulcer about a month ago, the ulcer seems to be improving, but the pain is worsening. She states she has lost her PCP and is begging for medication for pain. States he lost her insurance and lost her PCP a couple of months ago. Pt states her last analgesic was hydrocodone 5 mg yesterday from her brother. Her oxycodone 10 mg ran out 2 months ago. Pt states Dr. Bridgett Larsson stopped her coumadin at her last visit, is still taking Plavix, not taking ASA. Pt states she was told that she has a ruptured disc in her lumbar spine. She has occasional burning in her feet.  The patient denies New Medical or Surgical History.  Pt Diabetic: Yes, states her sugars are 160-300, states she has not run out of DM medication Pt smoker: smoker  (1/2 ppd, started at age 88 yrs)  Pt meds include: Statin :No, ran out of atorvastatin a week ago ASA: Yes Other anticoagulants/antiplatelets: Plavix  Past Medical History  Diagnosis Date  . Hypertension   . Diabetes mellitus with peripheral vascular disease   . Hypercholesterolemia   . Tobacco abuse   . Coronary artery disease     NON OBSTRUCTIVE    Social History History  Substance Use Topics  . Smoking status: Current Every Day Smoker -- 1.00  packs/day for 30 years    Types: Cigarettes  . Smokeless tobacco: Never Used  . Alcohol Use: No    Family History Family History  Problem Relation Age of Onset  . Heart failure Mother   . Heart attack Mother   . Heart disease Father     s/p CABG  . Diabetes    . High blood pressure    . High Cholesterol    . Lung cancer Maternal Uncle     Past Surgical History  Procedure Laterality Date  . Carpal tunnel release    . Cardiac catheterization    . Thrombectomy and revision of arterioventous (av) goretex  graft Left 08/16/2013    Procedure: Left below knee popliteal and peroneal thrombectomy; tibial and peroneal endaterectomy with patch angioplasty;  Surgeon: Conrad [place], MD;  Location: [facility];  Service: Vascular;  Laterality: Left;  . Abdominal angiogram N/A 08/16/2013    Procedure: ABDOMINAL ANGIOGRAM;  Surgeon: Conrad [place], MD;  Location: [facility] CATH LAB;  Service: Cardiovascular;  Laterality: N/A;  . Percutaneous stent intervention Left 08/16/2013    Procedure: PERCUTANEOUS STENT INTERVENTION;  Surgeon: Conrad [place], MD;  Location: [facility] CATH LAB;  Service: Cardiovascular;  Laterality: Left;  SFA    Allergies  Allergen Reactions  . Fish Allergy   . Ibuprofen     Current Outpatient Prescriptions  Medication Sig Dispense Refill  . atorvastatin (LIPITOR) 80 MG tablet Take 1 tablet (80 mg total) by mouth daily at 6 PM. 30 tablet 0  .  clopidogrel (PLAVIX) 75 MG tablet Take 1 tablet (75 mg total) by mouth daily with breakfast. 30 tablet 0  . enoxaparin (LOVENOX) 120 MG/0.8ML injection Inject 0.8 mLs (120 mg total) into the skin daily. Until INR greater than or equal to 2.0 2 Syringe 1  . lisinopril (PRINIVIL,ZESTRIL) 5 MG tablet Take 1 tablet (5 mg total) by mouth daily. 30 tablet 0  . metFORMIN (GLUCOPHAGE) 500 MG tablet Take 1 tablet (500 mg total) by mouth 2 (two) times daily with a meal. 60 tablet 0  . Oxycodone HCl 10 MG TABS Take 1 tablet (10 mg total) by mouth every 6 (six) hours as  needed (pain). 30 tablet 0  . promethazine (PHENERGAN) 12.5 MG tablet Take 1 tablet (12.5 mg total) by mouth every 6 (six) hours as needed for nausea or vomiting. 10 tablet 0  . warfarin (COUMADIN) 5 MG tablet Take 1 tablet (5 mg total) by mouth daily. Or as directed 30 tablet 0   No current facility-administered medications for this visit.    ROS: See HPI for pertinent positives and negatives.   Physical Examination  Filed Vitals:   04/03/14 1504  BP: 163/91  Pulse: 92  Temp: 97.9 F (36.6 C)  TempSrc: Oral  Resp: 16  Height: 5\' 6"  (1.676 m)  Weight: 158 lb (71.668 kg)  SpO2: 99%   Body mass index is 25.51 kg/(m^2).  General: A&O x 3, WDWN  Eyes: PERRLA  Pulmonary: Sym exp, good air movt, CTAB, no rales, rhonchi, & wheezing  Cardiac: RRR, Nl S1, S2, no detected murmur  Vascular: Vessel Right Left  Radial Palpable Palpable  Brachial Palpable Palpable  Carotid Palpable, without bruit Palpable, without bruit  Aorta Not palpable N/A  Femoral Palpable Palpable  Popliteal Not palpable Not palpable  PT Palpable Not Palpable  DP Palpable Not Palpable   Gastrointestinal: soft, NTND, -G/R, - HSM, - palpable masses, - CVAT B  Musculoskeletal: M/S 5/5 throughout , Extremities without ischemic changes, possible ulcer or abrasion at left dorsal metatarsal prominence , healed L 4th and 5th toes: slightly shortened from other toes  Neurologic: Pain and light touch intact in extremities , Motor exam as listed above     Non-Invasive Vascular Imaging: DATE: 04/03/2014 LOWER EXTREMITY ARTERIAL EVALUATION    INDICATION: Follow-up left lower extremity peripheral vascular disease    PREVIOUS INTERVENTION(S): Left superficial femoral artery stent with thrombectomy and angioplasty of popliteal artery and PT trunk and endarterectomy of proximal anterior tibial artery on 08/16/2013    DUPLEX EXAM:     RIGHT  LEFT   Peak Systolic Velocity (cm/s) Ratio (if  abnormal) Waveform  Peak Systolic Velocity (cm/s) Ratio (if abnormal) Waveform     Artery - Proximal to Stent 97  T     Stent - Origin 254  T     Stent - Proximal 326 3.3 T     Stent - Mid 86  M     Stent - Distal 478 4.7 M      Stent - End 391  M     Artery - Distal to Stent 0  Absent  1.05 Today's ABI / TBI 0.57  1.01 Previous ABI / TBI (12/13/2013 ) 0.93    Waveform:    M - Monophasic       B - Biphasic       T - Triphasic  If Ankle Brachial Index (ABI) or Toe Brachial Index (TBI) performed, please see complete report  ADDITIONAL FINDINGS:  IMPRESSION: 1. Patent stent of the left superficial femoral artery with significant (>50%) stenosis of the proximal and distal stent.  2. The PT trunk appears occluded with reconstitution of the tibial vessels in the mid-calf.     Compared to the previous exam:  Disease progression of the left lower extremity.      ASSESSMENT: Toni Logan is a 43 y.o. female who is s/p left superficial femoral artery stent with thrombectomy and angioplasty of popliteal artery and PT trunk and endarterectomy of proximal anterior tibial artery on 08/16/2013.  Pt c/o pain in her left LE that she states continues from before the above surgery, but is worsening. She is crying at this time, requesting a prescription analgesic, states she tried gabapentin for her back pain, states this did not help.  Today's left LE arterial Duplex reveals  significant (>50%) stenosis of the proximal (326 cm/s) and distal (478 cm/s) SFA stent. Right ABI remains normal but left decreased to 0.57 from 0.93 compared to 12/13/13. The lesion on the dorsal aspect of her left foot does not have the usual characteristics of a PAD ulcer, appears to be a rubbed area from a shoe that is improving per pt. It is quite atypical to have this degree of rest pain from an ABI of 0.57. Her left LE pain is more likely from her known lumbar HNP and/or worsening diabetic neuropathy due to uncontrolled DM  and tobacco abuse. Nevertheless, a prescription for Tramadol was given to help with pain relief.  Follow up with Dr. Bridgett Larsson in 2 weeks. See Plan.   Unfortunately she continues to smoke and her DM seems uncontrolled according to her self reported home glucose checks. She states she can no longer see the low cost clinic in [place] as a PCP for unclear reasons.   PLAN:  The patient was counseled re smoking cessation and given several free resources re smoking cessation. The pt was given a referral to a PCP in her area, close to [place]. Tramadol 50 mg 1-2 tablets every 6 hours prn pain, disp #60, 0 refills for pt c/o severe left LE pain. Pt ran out of atorvastatin 80 mg a week ago, #30 dispensed and reordered 1 tablet daily. I discussed in depth with the patient the nature of atherosclerosis, and emphasized the importance of maximal medical management including strict control of blood pressure, blood glucose, and lipid levels, obtaining regular exercise, and cessation of smoking.  The patient is aware that without maximal medical management the underlying atherosclerotic disease process will progress, limiting the benefit of any interventions.  Based on the patient's vascular studies and examination, and after discussing with Dr. Donnetta Hutching, pt will return to clinic in 2 weeks to see Dr. Bridgett Larsson for him to decide whether further evaluation or vascular intervention is necessary.  The patient was given information about PAD including signs, symptoms, treatment, what symptoms should prompt the patient to seek immediate medical care, and risk reduction measures to take.  Clemon Chambers, RN, MSN, FNP-C [facility] Phone: [phone]  Clinic MD: Early on call  04/03/2014 2:30 PM

## 2014-04-18 ENCOUNTER — Encounter: Payer: Self-pay | Admitting: Vascular Surgery

## 2014-04-19 ENCOUNTER — Encounter: Payer: Self-pay | Admitting: Vascular Surgery

## 2014-04-19 ENCOUNTER — Other Ambulatory Visit: Payer: Self-pay

## 2014-04-19 ENCOUNTER — Ambulatory Visit (INDEPENDENT_AMBULATORY_CARE_PROVIDER_SITE_OTHER): Payer: Medicaid Other | Admitting: Vascular Surgery

## 2014-04-19 VITALS — BP 137/69 | HR 82 | Ht 66.0 in | Wt 169.0 lb

## 2014-04-19 DIAGNOSIS — Z720 Tobacco use: Secondary | ICD-10-CM | POA: Diagnosis not present

## 2014-04-19 DIAGNOSIS — I749 Embolism and thrombosis of unspecified artery: Secondary | ICD-10-CM | POA: Diagnosis not present

## 2014-04-19 MED ORDER — OXYCODONE-ACETAMINOPHEN 5-325 MG PO TABS: 1.0000 | ORAL_TABLET | ORAL | Status: DC | PRN

## 2014-04-19 NOTE — Progress Notes (Signed)
    Established Intermittent Claudication  History of Present Illness  Loxley Brower is a 44 y.o. (1970/06/02) female who presents with chief complaint: left leg pain.  Pt's PTA+S L SFA complication with embolization requiring TE, EA w/ BPA L BK pop artery, TE, EA and BPA TPT, L AT EA (08/16/13).  The patient's symptoms have progressed.  The patient's symptoms are: neuropathic pain from hip to foot.  Sx characters are consistent with neuropathy.  The patient's treatment regimen currently included: maximal medical management.  Pt was referred back to me for increased velocities in the left SFA (PSV 478 c/s).  Pt has poor medical compliance due to financial difficulties but continues to smoke.  The patient's PMH, PSH, SH, FamHx, Med, and Allergies are unchanged from 04/03/14.  On ROS today: neuropathic pain in left leg, continued smoking, poor medical compliance.  Physical Examination  Filed Vitals:   04/19/14 0922  BP: 137/69  Pulse: 82  Height: 5\' 6"  (1.676 m)  Weight: 169 lb (76.658 kg)  SpO2: 100%   Body mass index is 27.29 kg/(m^2).  General: A&O x 3, WD, thin  Pulmonary: Sym exp, good air movt, CTAB, no rales, rhonchi, & wheezing  Cardiac: RRR, Nl S1, S2, no Murmurs, rubs or gallops  Vascular: Vessel Right Left  Radial Palpable Palpable  Brachial Palpable Palpable  Carotid Palpable, without bruit Palpable, without bruit  Aorta Not palpable N/A  Femoral Palpable Palpable  Popliteal Not palpable Not palpable  PT Not Palpable Not Palpable  DP Not Palpable Not Palpable   Gastrointestinal: soft, NTND, -G/R, - HSM, - masses, - CVAT B  Musculoskeletal: M/S 5/5 throughout , Extremities without ischemic changes , incisions well healed  Neurologic: Pain and light touch intact in extremities , Motor exam as listed above  Medical Decision Making  Collin Battey is a 44 y.o. female who presents with:  left leg intermittent claudication without evidence of critical limb ischemia,  left leg neuropathy of likely multiple etiologies   Unfortunately, I don't think her primary sx are due to her atherosclerosis in her left leg.  The problem is I suspect she likely has progression of disease in the left SFA due to disease proximal and distal to the stent placed to treat a ruptured SFA plaque.  I doubt this patient will do well with a surgical revascularization with her poor compliance history.  Based on the patient's vascular studies and examination, I have offered the patient: LLE runoff with possible intervention (TBD scheduled)  I discussed in depth with the patient the nature of atherosclerosis, and emphasized the importance of maximal medical management including strict control of blood pressure, blood glucose, and lipid levels, antiplatelet agents, obtaining regular exercise, and cessation of smoking.    The patient is aware that without maximal medical management the underlying atherosclerotic disease process will progress, limiting the benefit of any interventions.  Unfortunately, the patient is not taking any medication due to financial reasons.  I reiterated the need to continue with a statin and Plavix minimally.  I gave her a prescription for: Percocet 5/325 mg 1-2 po q4-6 hr prin pain #30 no refills to help with severe episodes of her neuropathic pain.  She has not been able to continue with Lyrica or Neurotin due to cost.  Thank you for allowing [place] to participate in this patient's care.  Adele Barthel, MD [facility] [place] Office: [phone] Pager: [phone]  04/19/2014, 1:24 PM

## 2014-04-22 ENCOUNTER — Ambulatory Visit (HOSPITAL_COMMUNITY)
Admission: RE | Admit: 2014-04-22 | Discharge: 2014-04-22 | Disposition: A | Discharge status: Home / Self Care | Payer: Self-pay | Source: Ambulatory Visit | Attending: Vascular Surgery | Admitting: Vascular Surgery

## 2014-04-22 ENCOUNTER — Encounter (HOSPITAL_COMMUNITY): Payer: Self-pay | Admitting: Vascular Surgery

## 2014-04-22 ENCOUNTER — Encounter (HOSPITAL_COMMUNITY)
Admission: RE | Disposition: A | Discharge status: Home / Self Care | Payer: Self-pay | Source: Ambulatory Visit | Attending: Vascular Surgery

## 2014-04-22 DIAGNOSIS — G629 Polyneuropathy, unspecified: Secondary | ICD-10-CM | POA: Insufficient documentation

## 2014-04-22 DIAGNOSIS — E1151 Type 2 diabetes mellitus with diabetic peripheral angiopathy without gangrene: Secondary | ICD-10-CM | POA: Diagnosis present

## 2014-04-22 DIAGNOSIS — F17210 Nicotine dependence, cigarettes, uncomplicated: Secondary | ICD-10-CM | POA: Insufficient documentation

## 2014-04-22 DIAGNOSIS — I739 Peripheral vascular disease, unspecified: Secondary | ICD-10-CM | POA: Insufficient documentation

## 2014-04-22 DIAGNOSIS — I70222 Atherosclerosis of native arteries of extremities with rest pain, left leg: Secondary | ICD-10-CM | POA: Diagnosis not present

## 2014-04-22 DIAGNOSIS — I708 Atherosclerosis of other arteries: Secondary | ICD-10-CM | POA: Insufficient documentation

## 2014-04-22 HISTORY — PX: LOWER EXTREMITY ANGIOGRAM: SHX5508

## 2014-04-22 LAB — PROTIME-INR
INR: 0.98 (ref 0.00–1.49)
PROTHROMBIN TIME: 13.1 s (ref 11.6–15.2)

## 2014-04-22 LAB — POCT ACTIVATED CLOTTING TIME
Activated Clotting Time: 232 seconds
Activated Clotting Time: 171 seconds
Activated Clotting Time: 196 seconds

## 2014-04-22 LAB — GLUCOSE, CAPILLARY: Glucose-Capillary: 120 mg/dL — ABNORMAL HIGH (ref 70–99)

## 2014-04-22 LAB — HCG, SERUM, QUALITATIVE: Preg, Serum: NEGATIVE

## 2014-04-22 LAB — POCT I-STAT, CHEM 8
BUN: 21 mg/dL (ref 6–23)
CHLORIDE: 103 meq/L (ref 96–112)
CREATININE: 0.5 mg/dL (ref 0.50–1.10)
Calcium, Ion: 1.19 mmol/L (ref 1.12–1.23)
Glucose, Bld: 144 mg/dL — ABNORMAL HIGH (ref 70–99)
HCT: 45 % (ref 36.0–46.0)
Hemoglobin: 15.3 g/dL — ABNORMAL HIGH (ref 12.0–15.0)
Potassium: 4.5 mmol/L (ref 3.5–5.1)
Sodium: 137 mmol/L (ref 135–145)
TCO2: 22 mmol/L (ref 0–100)

## 2014-04-22 SURGERY — ANGIOGRAM, LOWER EXTREMITY
Anesthesia: LOCAL

## 2014-04-22 MED ORDER — OXYCODONE-ACETAMINOPHEN 5-325 MG PO TABS
ORAL_TABLET | ORAL | Status: AC
  Filled 2014-04-22: qty 2

## 2014-04-22 MED ORDER — LIDOCAINE HCL (PF) 1 % IJ SOLN
INTRAMUSCULAR | Status: AC
  Filled 2014-04-22: qty 30

## 2014-04-22 MED ORDER — OXYCODONE-ACETAMINOPHEN 5-325 MG PO TABS
1.0000 | ORAL_TABLET | ORAL | Status: DC | PRN
  Administered 2014-04-22 (×2): 2 via ORAL

## 2014-04-22 MED ORDER — SODIUM CHLORIDE 0.9 % IV SOLN
1.0000 mL/kg/h | INTRAVENOUS | Status: DC
  Administered 2014-04-22: 1 mL/kg/h via INTRAVENOUS

## 2014-04-22 MED ORDER — MIDAZOLAM HCL 2 MG/2ML IJ SOLN
INTRAMUSCULAR | Status: AC
  Filled 2014-04-22: qty 2

## 2014-04-22 MED ORDER — HEPARIN SODIUM (PORCINE) 1000 UNIT/ML IJ SOLN
INTRAMUSCULAR | Status: AC
  Filled 2014-04-22: qty 1

## 2014-04-22 MED ORDER — FENTANYL CITRATE 0.05 MG/ML IJ SOLN
INTRAMUSCULAR | Status: AC
  Filled 2014-04-22: qty 2

## 2014-04-22 MED ORDER — CLOPIDOGREL BISULFATE 75 MG PO TABS: 75.0000 mg | ORAL_TABLET | Freq: Every day | ORAL | Status: DC

## 2014-04-22 MED ORDER — HEPARIN (PORCINE) IN NACL 2-0.9 UNIT/ML-% IJ SOLN
INTRAMUSCULAR | Status: AC
  Filled 2014-04-22: qty 500

## 2014-04-22 MED ORDER — SODIUM CHLORIDE 0.9 % IV SOLN
INTRAVENOUS | Status: DC
  Administered 2014-04-22: 11:00:00 via INTRAVENOUS

## 2014-04-22 MED ORDER — MORPHINE SULFATE 10 MG/ML IJ SOLN: 2.0000 mg | INTRAMUSCULAR | Status: DC | PRN

## 2014-04-22 MED ORDER — MORPHINE SULFATE 2 MG/ML IJ SOLN
INTRAMUSCULAR | Status: AC
  Administered 2014-04-22: 2 mg via INTRAVENOUS
  Filled 2014-04-22: qty 1

## 2014-04-22 NOTE — Interval H&P Note (Signed)
[facility] [place]  History and Physical Update  The patient was interviewed and re-examined.  The patient's previous History and Physical has been reviewed and is unchanged.  There is no change in the plan of care: LLE angiogram, possible intervention.  Adele Barthel, MD [facility] [place] Office: [phone] Pager: [phone]  04/22/2014, 11:45 AM

## 2014-04-22 NOTE — Op Note (Addendum)
OPERATIVE NOTE   PROCEDURE: 1.  right common femoral artery cannulation under ultrasound guidance 2.  Placement of catheter in aorta 3.  Second order arterial selection 4.  Left leg runoff 5.  Angioplasty of left superficial femoral artery (4 mm x 40 mm) 6.  Drug coated angioplasty of left superficial femoral artery x 2 (2 Lutonix 4 mm x 40 mm)  PRE-OPERATIVE DIAGNOSIS: Left superficial femoral artery high grade stenosis  POST-OPERATIVE DIAGNOSIS: same as above   SURGEON: Adele Barthel, MD  ANESTHESIA: conscious sedation  ESTIMATED BLOOD LOSS: 50 cc  CONTRAST: 60 cc  FINDING(S):   Left  CFA Patent  SFA Patent with patent stent, 50% stenosis just proximal to stent:  near resolved after serial angioplasty, >75% stenosis 2-3 cm distal to stent: near resolved after serial angioplasty  PFA Patent  Pop Patent  Trif Occluded tibioperoneal trunk   AT Patent but diseased  Pero Not visualized   PT Reconstitutes from collaterals   SPECIMEN(S):  none  INDICATIONS:   Toni Logan is a 44 y.o. female who presents with left leg pain of likely neuropathic etiology.  She has had abnormal left leg arterial duplex suggesting severe disease in the left superficial femoral artery.  From a prior stenting, I was concerned she was developing in-stent stenosis, so I recommended Left leg angiogram, possible intervention.  I discussed with the patient the nature of angiographic procedures, especially the limited patencies of any endovascular intervention.  The patient is aware of that the risks of an angiographic procedure include but are not limited to: bleeding, infection, access site complications, renal failure, embolization, rupture of vessel, dissection, possible need for emergent surgical intervention, possible need for surgical procedures to treat the patient's pathology, and stroke and death.  The patient is aware of the risks and agrees to proceed.  DESCRIPTION: After full informed consent  was obtained from the patient, the patient was brought back to the angiography suite.  The patient was placed supine upon the angiography table and connected to monitoring equipment.  The patient was then given conscious sedation, the amounts of which are documented in the patient's chart.  The patient was prepped and drape in the standard fashion for an angiographic procedure.  At this point, attention was turned to the right groin.  Under ultrasound guidance, the right common femoral artery was cannulated with a micropuncture needle.  The microwire was advanced into the iliac arterial system.  The needle was exchanged for a microsheath, which was loaded into the common femoral artery over the wire.  The microwire was exchanged for a [facility] wire which was advanced into the aorta.  The microsheath was then exchanged for a 5-Fr sheath which was loaded into the common femoral artery.  The [facility] wire was replaced in the catheter, and using the [place] and Omniflush catheter, the left common iliac artery was selected.  The catheter and wire were advanced into the external iliac artery.  An automated left leg runoff was completed.  The findings are documented above.  I felt intervention was needed as this patient is a poor surgical candidate due to poor compliancy.  The wire was exchanged for a Rosen wire.  The patient's right femoral sheath was exchanged for a 6-Fr Destination sheath, which was lodged in the left common femoral artery.  The dilator was removed.  The patient was given 7000 units of Heparin intravenously, which was a therapeutic bolus.  I crossed the superficial femoral artery stenosis with a Versacore  wire, which verified the stenosis proximal to the stent by curling at that point.  I was able with an end-hole catheter and this wire to cross both stenoses in the left superficial femoral artery.  I predilated the two stenoses with a 4 mm x 40 mm balloon by inflating to profile for 1 minute.  Hand  injection demonstrated incomplete resolution of both stenoses.  I loaded a Lutonix 4 mm x 40 mm balloon over the wire and treated the proximal stenosis at 6 atm for 2 minutes.  The balloon was exchanged for another Lutonix 4 mm x 40 mm balloon over the wire and used it to treat the distal stenosis at 6 atm for 2 minutes.  I did hand injections to obtain completion angiograms of the left leg.  Based on the findings, I felt that we had a satisfactory result with no evidence of distal embolization.  The wire and catheter were pulled into the right external iliac artery where they will be removed once the heparin fully reverses.  Of note, this patient had multiple occurrences of her left leg radiating pain and back while laying on her back, strongly suggesting a spinal etiology for her left leg pain.  COMPLICATIONS: none  CONDITION: stable   Adele Barthel, MD [facility] [place] Office: [phone] Pager: [phone]  04/22/2014, 2:35 PM

## 2014-04-22 NOTE — Progress Notes (Signed)
Site area:right groin a 6 french arterial sheath removed  Site Prior to Removal:  Level 0  Pressure Applied For 20 MINUTES    Minutes Beginning at [id number] p  Manual Pressure: yes     Patient Status During Pull:  stable  Post Pull Groin Site:  Level 0  Post Pull Instructions Given:  Yes.    Post Pull Pulses Present:  Yes.    Dressing Applied:  Yes.    Comments:  VS remain stable during sheath pull.  Pt complain of groin soreness.

## 2014-04-22 NOTE — Discharge Instructions (Signed)
NO METFORMIN FOR 2 DAYS ° ° ° ° ° °Angiogram, Care After °Refer to this sheet in the next few weeks. These instructions provide you with information on caring for yourself after your procedure. Your health care provider may also give you more specific instructions. Your treatment has been planned according to current medical practices, but problems sometimes occur. Call your health care provider if you have any problems or questions after your procedure.  °WHAT TO EXPECT AFTER THE PROCEDURE °After your procedure, it is typical to have the following sensations: °· Minor discomfort or tenderness and a small bump at the catheter insertion site. The bump should usually decrease in size and tenderness within 1 to 2 weeks. °· Any bruising will usually fade within 2 to 4 weeks. °HOME CARE INSTRUCTIONS  °· You may need to keep taking blood thinners if they were prescribed for you. Take medicines only as directed by your health care provider. °· Do not apply powder or lotion to the site. °· Do not take baths, swim, or use a hot tub until your health care provider approves. °· You may shower 24 hours after the procedure. Remove the bandage (dressing) and gently wash the site with plain soap and water. Gently pat the site dry. °· Inspect the site at least twice daily. °· Limit your activity for the first 48 hours. Do not bend, squat, or lift anything over 20 lb (9 kg) or as directed by your health care provider. °· Plan to have someone take you home after the procedure. Follow instructions about when you can drive or return to work. °SEEK MEDICAL CARE IF: °· You get light-headed when standing up. °· You have drainage (other than a small amount of blood on the dressing). °· You have chills. °· You have a fever. °· You have redness, warmth, swelling, or pain at the insertion site. °SEEK IMMEDIATE MEDICAL CARE IF:  °· You develop chest pain or shortness of breath, feel faint, or pass out. °· You have bleeding, swelling larger  than a walnut, or drainage from the catheter insertion site. °· You develop pain, discoloration, coldness, or severe bruising in the leg or arm that held the catheter. °· You develop bleeding from any other place, such as the bowels. You may see bright red blood in your urine or stools, or your stools may appear black and tarry. °· You have heavy bleeding from the site. If this happens, hold pressure on the site. °MAKE SURE YOU: °· Understand these instructions. °· Will watch your condition. °· Will get help right away if you are not doing well or get worse. °Document Released: 10/15/2004 Document Revised: 08/13/2013 Document Reviewed: 08/21/2012 °Exit[employer]® Patient Information ©2015 [employer]. This information is not intended to replace advice given to you by your health care provider. Make sure you discuss any questions you have with your health care provider. ° °

## 2014-04-22 NOTE — H&P (View-Only) (Signed)
    Established Intermittent Claudication  History of Present Illness  Toni Logan is a 44 y.o. ([birth year]) female who presents with chief complaint: left leg pain.  Pt's PTA+S L SFA complication with embolization requiring TE, EA w/ BPA L BK pop artery, TE, EA and BPA TPT, L AT EA (08/16/13).  The patient's symptoms have progressed.  The patient's symptoms are: neuropathic pain from hip to foot.  Sx characters are consistent with neuropathy.  The patient's treatment regimen currently included: maximal medical management.  Pt was referred back to me for increased velocities in the left SFA (PSV 478 c/s).  Pt has poor medical compliance due to financial difficulties but continues to smoke.  The patient's PMH, PSH, SH, FamHx, Med, and Allergies are unchanged from 04/03/14.  On ROS today: neuropathic pain in left leg, continued smoking, poor medical compliance.  Physical Examination  Filed Vitals:   04/19/14 0922  BP: 137/69  Pulse: 82  Height: 5\' 6"  (1.676 m)  Weight: 169 lb (76.658 kg)  SpO2: 100%   Body mass index is 27.29 kg/(m^2).  General: A&O x 3, WD, thin  Pulmonary: Sym exp, good air movt, CTAB, no rales, rhonchi, & wheezing  Cardiac: RRR, Nl S1, S2, no Murmurs, rubs or gallops  Vascular: Vessel Right Left  Radial Palpable Palpable  Brachial Palpable Palpable  Carotid Palpable, without bruit Palpable, without bruit  Aorta Not palpable N/A  Femoral Palpable Palpable  Popliteal Not palpable Not palpable  PT Not Palpable Not Palpable  DP Not Palpable Not Palpable   Gastrointestinal: soft, NTND, -G/R, - HSM, - masses, - CVAT B  Musculoskeletal: M/S 5/5 throughout , Extremities without ischemic changes , incisions well healed  Neurologic: Pain and light touch intact in extremities , Motor exam as listed above  Medical Decision Making  Toni Logan is a 44 y.o. female who presents with:  left leg intermittent claudication without evidence of critical limb ischemia,  left leg neuropathy of likely multiple etiologies   Unfortunately, I don't think her primary sx are due to her atherosclerosis in her left leg.  The problem is I suspect she likely has progression of disease in the left SFA due to disease proximal and distal to the stent placed to treat a ruptured SFA plaque.  I doubt this patient will do well with a surgical revascularization with her poor compliance history.  Based on the patient's vascular studies and examination, I have offered the patient: LLE runoff with possible intervention (TBD scheduled)  I discussed in depth with the patient the nature of atherosclerosis, and emphasized the importance of maximal medical management including strict control of blood pressure, blood glucose, and lipid levels, antiplatelet agents, obtaining regular exercise, and cessation of smoking.    The patient is aware that without maximal medical management the underlying atherosclerotic disease process will progress, limiting the benefit of any interventions.  Unfortunately, the patient is not taking any medication due to financial reasons.  I reiterated the need to continue with a statin and Plavix minimally.  I gave her a prescription for: Percocet 5/325 mg 1-2 po q4-6 hr prin pain #30 no refills to help with severe episodes of her neuropathic pain.  She has not been able to continue with Lyrica or Neurotin due to cost.  Thank you for allowing [place] to participate in this patient's care.  Adele Barthel, MD [facility] Office: [phone] Pager: [phone]  04/19/2014, 1:24 PM

## 2014-04-24 ENCOUNTER — Telehealth: Payer: Self-pay | Admitting: Vascular Surgery

## 2014-04-24 NOTE — Telephone Encounter (Addendum)
-----   Message from Mena Goes, RN sent at 04/23/2014  9:56 AM EST ----- Regarding: Schedule   ----- Message -----    From: Conrad [place], MD    Sent: 04/22/2014   2:47 PM      To: Vvs Charge Pool  Kassey Laforest [id number] 1971-03-06   PROCEDURE: 1.  right common femoral artery cannulation under ultrasound guidance 2.  Placement of catheter in aorta 3.  Second order arterial selection 4.  Left leg runoff 5.  Angioplasty of left superficial femoral artery (4 mm x 40 mm) 6.  Drug coated angioplasty of left superficial femoral artery x 2 (2 Lutonix 4 mm x 40 mm)  Follow-up: 4 weeks  04/24/14: LVM for pt at home #, dpm

## 2014-05-07 ENCOUNTER — Telehealth: Payer: Self-pay | Admitting: *Deleted

## 2014-05-07 NOTE — Telephone Encounter (Signed)
Patient called in to request more Oxycodone because of increased leg pain. She had an aortogram w/ Left SFA angioplasty by Dr. Bridgett Larsson on 04-22-14. I moved up her appt from 05-17-14 to tomorrow for evaluation and to discuss pain medication with Dr. Bridgett Larsson.

## 2014-05-08 ENCOUNTER — Encounter: Payer: Self-pay | Admitting: Vascular Surgery

## 2014-05-08 ENCOUNTER — Ambulatory Visit (INDEPENDENT_AMBULATORY_CARE_PROVIDER_SITE_OTHER): Payer: Self-pay | Admitting: Vascular Surgery

## 2014-05-08 VITALS — BP 106/59 | HR 81 | Ht 66.5 in | Wt 169.5 lb

## 2014-05-08 DIAGNOSIS — I779 Disorder of arteries and arterioles, unspecified: Secondary | ICD-10-CM | POA: Insufficient documentation

## 2014-05-08 DIAGNOSIS — I739 Peripheral vascular disease, unspecified: Secondary | ICD-10-CM

## 2014-05-08 DIAGNOSIS — G629 Polyneuropathy, unspecified: Secondary | ICD-10-CM | POA: Insufficient documentation

## 2014-05-08 DIAGNOSIS — I743 Embolism and thrombosis of arteries of the lower extremities: Secondary | ICD-10-CM

## 2014-05-08 HISTORY — DX: Disorder of arteries and arterioles, unspecified: I77.9

## 2014-05-08 MED ORDER — OXYCODONE HCL 5 MG PO TABS
10.0000 mg | ORAL_TABLET | Freq: Four times a day (QID) | ORAL | Status: DC | PRN
Start: 2014-05-08 — End: 2014-05-08

## 2014-05-08 MED ORDER — OXYCODONE HCL 5 MG PO TABS: 10.0000 mg | ORAL_TABLET | Freq: Four times a day (QID) | ORAL | Status: DC | PRN

## 2014-05-08 NOTE — Addendum Note (Signed)
Addended by: Mena Goes on: 05/08/2014 04:21 PM   Modules accepted: Orders

## 2014-05-08 NOTE — Progress Notes (Addendum)
    Postoperative Visit   History of Present Illness  Toni Logan is a 44 y.o. female who presents for postoperative follow-up from procedure on Date: 04/22/14: Lynnell Grain and DCB PTA L SFA x 2 .  Patient never had wounds.  The patient continues to have radicular pain in her L leg which keeps her awake at night.  The patient is not able to complete their activities of daily living.  The patient does not have rest pain and does not have intermittent claudication.   Physical Examination  Filed Vitals:   05/08/14 0855  BP: 106/59  Pulse: 81  Height: 5' 6.5" (1.689 m)  Weight: 169 lb 8 oz (76.885 kg)  SpO2: 100%   Body mass index is 26.95 kg/(m^2).   Vascular: no pseudoaneurysm or echymosis in R groin cannulation site, palpable L AT  Medical Decision Making  Toni Logan is a 44 y.o. female who presents s/p L SFA PTA x 2 (bland and DCB) for progression of SFA disease, peripheral neuropathy likely of multifactorial etiology . Based on his angiographic findings, this patient needs: q3 month BLE ABI and LLE arterial duplex.  This patient is in the process of getting into her PCP's office for further evaluation of her neuropathy.  She had an episode of radiating pain down her left leg during her procedure during which the arterial patency was complete from groin to foot, strongly suggesting this is NOT arterial in etiology.  As a bridge to getting back into her PCP's office, I am reissuing: Oxycodone 10 mg 1 PO q6 hr prn moderate pain #30, no refills.    I would refer her to Pain Clinic for chronic mgmt but she declined due lack of finances.    The patient continues to smoke at this time but is cutting back.  We again discussed the need for smoking cessation.  Thank you for allowing [place] to participate in this patient's care.  Adele Barthel, MD [facility] Office: [phone] Pager: [phone]

## 2014-05-17 ENCOUNTER — Encounter: Payer: Self-pay | Admitting: Vascular Surgery

## 2014-08-08 ENCOUNTER — Encounter: Payer: Self-pay | Admitting: Vascular Surgery

## 2014-08-09 ENCOUNTER — Encounter: Payer: Self-pay | Admitting: Vascular Surgery

## 2014-08-09 ENCOUNTER — Ambulatory Visit (INDEPENDENT_AMBULATORY_CARE_PROVIDER_SITE_OTHER): Payer: Medicaid Other | Admitting: Vascular Surgery

## 2014-08-09 ENCOUNTER — Encounter (HOSPITAL_COMMUNITY): Payer: Self-pay

## 2014-08-09 ENCOUNTER — Ambulatory Visit: Payer: Self-pay | Admitting: Vascular Surgery

## 2014-08-09 ENCOUNTER — Ambulatory Visit (INDEPENDENT_AMBULATORY_CARE_PROVIDER_SITE_OTHER)
Admission: RE | Admit: 2014-08-09 | Discharge: 2014-08-09 | Disposition: A | Discharge status: Home / Self Care | Payer: Medicaid Other | Source: Ambulatory Visit | Attending: Vascular Surgery | Admitting: Vascular Surgery

## 2014-08-09 ENCOUNTER — Other Ambulatory Visit (HOSPITAL_COMMUNITY): Payer: Self-pay

## 2014-08-09 ENCOUNTER — Ambulatory Visit (HOSPITAL_COMMUNITY)
Admission: RE | Admit: 2014-08-09 | Discharge: 2014-08-09 | Disposition: A | Discharge status: Home / Self Care | Payer: Medicaid Other | Source: Ambulatory Visit | Attending: Vascular Surgery | Admitting: Vascular Surgery

## 2014-08-09 VITALS — BP 125/79 | HR 72 | Ht 66.5 in | Wt 198.4 lb

## 2014-08-09 DIAGNOSIS — G629 Polyneuropathy, unspecified: Secondary | ICD-10-CM | POA: Insufficient documentation

## 2014-08-09 DIAGNOSIS — I743 Embolism and thrombosis of arteries of the lower extremities: Secondary | ICD-10-CM

## 2014-08-09 DIAGNOSIS — I739 Peripheral vascular disease, unspecified: Secondary | ICD-10-CM | POA: Diagnosis not present

## 2014-08-09 DIAGNOSIS — I779 Disorder of arteries and arterioles, unspecified: Secondary | ICD-10-CM

## 2014-08-09 DIAGNOSIS — I749 Embolism and thrombosis of unspecified artery: Secondary | ICD-10-CM | POA: Insufficient documentation

## 2014-08-09 MED ORDER — TRAMADOL HCL 50 MG PO TABS: 50.0000 mg | ORAL_TABLET | Freq: Four times a day (QID) | ORAL | Status: DC | PRN

## 2014-08-09 NOTE — Progress Notes (Addendum)
Established Intermittent Claudication  History of Present Illness  Toni Logan is a 44 y.o. (1970/06/23) female who presents with chief complaint: left toothache and left leg pain.  The patient's symptoms have not progressed.  The patient's symptoms are: left neuropathic sx, same intensity, and now onset of severe left tooth pain from a broken tooth.  The patient's treatment regimen currently included: maximal medical management.  She has not had further evaluation of her neuropathy due to lack of insurance.  To date, patient has had: 1. SIA+S L SFA CTO (04/18/99) complicated by embolism of PT 2. L BK pop TE, EA, BPA, L TPT TE, EA, BPA; L prox AT EA (08/16/13) 3. PTA L SFA w/ bland and DCB L SFA (04/22/14)  The patient's PMH, PSH, SH, FamHx, Med, and Allergies are unchanged from 05/08/14.  On ROS today: tooth pain, left leg neuropathic pain  Physical Examination  Filed Vitals:   08/09/14 1627  BP: 125/79  Pulse: 72  Height: 5' 6.5" (1.689 m)  Weight: 198 lb 6.4 oz (89.994 kg)  SpO2: 98%   Body mass index is 31.55 kg/(m^2).  General: A&O x 3, WD, more cubby than previously  Pulmonary: Sym exp, good air movt, CTAB, no rales, rhonchi, & wheezing  Cardiac: RRR, Nl S1, S2, no Murmurs, rubs or gallops  Vascular: Vessel Right Left  Radial Palpable Palpable  Brachial Palpable Palpable  Carotid Palpable, without bruit Palpable, without bruit  Aorta Not palpable N/A  Femoral Palpable Palpable  Popliteal Not palpable Not palpable  PT Palpable Not Palpable  DP Palpable Not Palpable   Gastrointestinal: soft, NTND, -G/R, - HSM, - masses, - CVAT B  Musculoskeletal: M/S 5/5 throughout , Extremities without ischemic changes , mild TTP to light touch of Left shin, no erythema  Neurologic: Pain and light touch intact in extremities , Motor exam as listed above  Non-Invasive Vascular Imaging ABI (Date: 08/09/2014)  R: 1.00 (1.05), DP: tri, PT: tri, TBI: 0.60  L: 0.63 (0.57), DP:  mono, PT: mono, TBI: 0.23  LLE arterial duplex (Date: 08/09/2014)  Lesion proximal to stent: widely patent @ 119 c/s  Widely patent stent  Lesion distal to stent: patent @ 213 c/s (2.4 ratio)  Occluded TPT trunk (chronic)  Monophasic peroneal, PT, and AT  Medical Decision Making  Toni Logan is a 44 y.o. female who presents with:  left leg intermittent claudication without evidence of critical limb ischemia.   Based on the patient's vascular studies and examination, I have offered the patient: q3 month ABI and LLE arterial duplex.  Pt sx are neuropathic in nature.  I doubt that her sx are primarily arterial in nature though it is possible that her extended period of ischemia in her left foot resulted in ischemic neuropathy prior to revascularization.  I discussed in depth with the patient the nature of atherosclerosis, and emphasized the importance of maximal medical management including strict control of blood pressure, blood glucose, and lipid levels, antiplatelet agents, obtaining regular exercise, and cessation of smoking.    The patient is aware that without maximal medical management the underlying atherosclerotic disease process will progress, limiting the benefit of any interventions. The patient is currently on a statin: Lipitor.   The patient is currently on an anti-platelet: Plavix.  The patient is also on warfarin for a thomboembolism.  Thank you for allowing [place] to participate in this patient's care.  Adele Barthel, MD [facility] Office: [phone] Pager: [phone]  08/09/2014, 5:20 PM

## 2014-08-12 ENCOUNTER — Other Ambulatory Visit: Payer: Self-pay | Admitting: *Deleted

## 2014-08-12 DIAGNOSIS — I779 Disorder of arteries and arterioles, unspecified: Secondary | ICD-10-CM

## 2014-11-04 ENCOUNTER — Encounter: Payer: Self-pay | Admitting: Vascular Surgery

## 2014-11-08 ENCOUNTER — Encounter (HOSPITAL_COMMUNITY): Payer: Self-pay

## 2014-11-08 ENCOUNTER — Ambulatory Visit: Payer: Self-pay | Admitting: Vascular Surgery

## 2014-12-11 ENCOUNTER — Inpatient Hospital Stay (HOSPITAL_COMMUNITY)
Admission: EM | Admit: 2014-12-11 | Discharge: 2014-12-17 | DRG: 253 | Disposition: A | Discharge status: Home Health | Payer: Self-pay | Attending: Vascular Surgery | Admitting: Vascular Surgery

## 2014-12-11 ENCOUNTER — Emergency Department (HOSPITAL_COMMUNITY): Payer: MEDICAID | Admitting: Anesthesiology

## 2014-12-11 ENCOUNTER — Emergency Department (HOSPITAL_COMMUNITY): Payer: Self-pay

## 2014-12-11 ENCOUNTER — Encounter (HOSPITAL_COMMUNITY)
Admission: EM | Disposition: A | Discharge status: Home Health | Payer: Self-pay | Source: Home / Self Care | Attending: Vascular Surgery

## 2014-12-11 ENCOUNTER — Encounter (HOSPITAL_COMMUNITY): Payer: Self-pay | Admitting: *Deleted

## 2014-12-11 ENCOUNTER — Emergency Department (HOSPITAL_COMMUNITY): Payer: Self-pay | Admitting: Anesthesiology

## 2014-12-11 ENCOUNTER — Telehealth: Payer: Self-pay

## 2014-12-11 DIAGNOSIS — F17210 Nicotine dependence, cigarettes, uncomplicated: Secondary | ICD-10-CM | POA: Diagnosis present

## 2014-12-11 DIAGNOSIS — I70229 Atherosclerosis of native arteries of extremities with rest pain, unspecified extremity: Secondary | ICD-10-CM

## 2014-12-11 DIAGNOSIS — Z79891 Long term (current) use of opiate analgesic: Secondary | ICD-10-CM

## 2014-12-11 DIAGNOSIS — Z79899 Other long term (current) drug therapy: Secondary | ICD-10-CM

## 2014-12-11 DIAGNOSIS — E1151 Type 2 diabetes mellitus with diabetic peripheral angiopathy without gangrene: Secondary | ICD-10-CM | POA: Diagnosis present

## 2014-12-11 DIAGNOSIS — Z801 Family history of malignant neoplasm of trachea, bronchus and lung: Secondary | ICD-10-CM

## 2014-12-11 DIAGNOSIS — Z8249 Family history of ischemic heart disease and other diseases of the circulatory system: Secondary | ICD-10-CM

## 2014-12-11 DIAGNOSIS — I779 Disorder of arteries and arterioles, unspecified: Secondary | ICD-10-CM

## 2014-12-11 DIAGNOSIS — I70202 Unspecified atherosclerosis of native arteries of extremities, left leg: Secondary | ICD-10-CM | POA: Diagnosis present

## 2014-12-11 DIAGNOSIS — Z833 Family history of diabetes mellitus: Secondary | ICD-10-CM

## 2014-12-11 DIAGNOSIS — I2510 Atherosclerotic heart disease of native coronary artery without angina pectoris: Secondary | ICD-10-CM | POA: Diagnosis present

## 2014-12-11 DIAGNOSIS — I10 Essential (primary) hypertension: Secondary | ICD-10-CM | POA: Diagnosis present

## 2014-12-11 DIAGNOSIS — I70222 Atherosclerosis of native arteries of extremities with rest pain, left leg: Secondary | ICD-10-CM

## 2014-12-11 DIAGNOSIS — Z7902 Long term (current) use of antithrombotics/antiplatelets: Secondary | ICD-10-CM

## 2014-12-11 DIAGNOSIS — Y848 Other medical procedures as the cause of abnormal reaction of the patient, or of later complication, without mention of misadventure at the time of the procedure: Secondary | ICD-10-CM | POA: Diagnosis present

## 2014-12-11 DIAGNOSIS — E780 Pure hypercholesterolemia: Secondary | ICD-10-CM | POA: Diagnosis present

## 2014-12-11 DIAGNOSIS — Z419 Encounter for procedure for purposes other than remedying health state, unspecified: Secondary | ICD-10-CM

## 2014-12-11 DIAGNOSIS — I739 Peripheral vascular disease, unspecified: Secondary | ICD-10-CM

## 2014-12-11 DIAGNOSIS — D62 Acute posthemorrhagic anemia: Secondary | ICD-10-CM | POA: Diagnosis not present

## 2014-12-11 DIAGNOSIS — Z7901 Long term (current) use of anticoagulants: Secondary | ICD-10-CM

## 2014-12-11 DIAGNOSIS — E1140 Type 2 diabetes mellitus with diabetic neuropathy, unspecified: Secondary | ICD-10-CM | POA: Diagnosis present

## 2014-12-11 DIAGNOSIS — T82858A Stenosis of vascular prosthetic devices, implants and grafts, initial encounter: Principal | ICD-10-CM | POA: Diagnosis present

## 2014-12-11 HISTORY — PX: FEMORAL-TIBIAL BYPASS GRAFT: SHX938

## 2014-12-11 HISTORY — DX: Atherosclerosis of native arteries of extremities with rest pain, unspecified extremity: I70.229

## 2014-12-11 HISTORY — PX: PERIPHERAL VASCULAR CATHETERIZATION: SHX172C

## 2014-12-11 HISTORY — PX: INTRAOPERATIVE ARTERIOGRAM: SHX5157

## 2014-12-11 HISTORY — PX: VEIN HARVEST: SHX6363

## 2014-12-11 LAB — CBC
HCT: 40.7 % (ref 36.0–46.0)
Hemoglobin: 13.7 g/dL (ref 12.0–15.0)
MCH: 29.3 pg (ref 26.0–34.0)
MCHC: 33.7 g/dL (ref 30.0–36.0)
MCV: 87 fL (ref 78.0–100.0)
PLATELETS: 275 10*3/uL (ref 150–400)
RBC: 4.68 MIL/uL (ref 3.87–5.11)
RDW: 14 % (ref 11.5–15.5)
WBC: 7.6 10*3/uL (ref 4.0–10.5)

## 2014-12-11 LAB — HEPATIC FUNCTION PANEL
ALBUMIN: 4 g/dL (ref 3.5–5.0)
ALK PHOS: 69 U/L (ref 38–126)
ALT: 20 U/L (ref 14–54)
AST: 23 U/L (ref 15–41)
BILIRUBIN TOTAL: 0.4 mg/dL (ref 0.3–1.2)
Bilirubin, Direct: 0.1 mg/dL (ref 0.1–0.5)
Indirect Bilirubin: 0.3 mg/dL (ref 0.3–0.9)
Total Protein: 6.9 g/dL (ref 6.5–8.1)

## 2014-12-11 LAB — BASIC METABOLIC PANEL
Anion gap: 9 (ref 5–15)
BUN: 6 mg/dL (ref 6–20)
CO2: 24 mmol/L (ref 22–32)
Calcium: 9.4 mg/dL (ref 8.9–10.3)
Chloride: 103 mmol/L (ref 101–111)
Creatinine, Ser: 0.53 mg/dL (ref 0.44–1.00)
GFR calc Af Amer: >60 mL/min (ref >60)
Glucose, Bld: 130 mg/dL — ABNORMAL HIGH (ref 65–99)
Potassium: 3.9 mmol/L (ref 3.5–5.1)
Sodium: 136 mmol/L (ref 135–145)

## 2014-12-11 LAB — I-STAT CHEM 8, ED
BUN: 8 mg/dL (ref 6–20)
CALCIUM ION: 1.17 mmol/L (ref 1.12–1.23)
CHLORIDE: 101 mmol/L (ref 101–111)
Creatinine, Ser: 0.5 mg/dL (ref 0.44–1.00)
Glucose, Bld: 127 mg/dL — ABNORMAL HIGH (ref 65–99)
HCT: 45 % (ref 36.0–46.0)
Hemoglobin: 15.3 g/dL — ABNORMAL HIGH (ref 12.0–15.0)
Potassium: 3.8 mmol/L (ref 3.5–5.1)
Sodium: 136 mmol/L (ref 135–145)
TCO2: 22 mmol/L (ref 0–100)

## 2014-12-11 LAB — GLUCOSE, CAPILLARY
GLUCOSE-CAPILLARY: 99 mg/dL (ref 65–99)
GLUCOSE-CAPILLARY: 127 mg/dL — AB (ref 65–99)

## 2014-12-11 LAB — POCT I-STAT 4, (NA,K, GLUC, HGB,HCT)
Glucose, Bld: 95 mg/dL (ref 65–99)
HCT: 34 % — ABNORMAL LOW (ref 36.0–46.0)
Hemoglobin: 11.6 g/dL — ABNORMAL LOW (ref 12.0–15.0)
POTASSIUM: 3.7 mmol/L (ref 3.5–5.1)
Sodium: 137 mmol/L (ref 135–145)

## 2014-12-11 LAB — PROTIME-INR
INR: 0.89 (ref 0.00–1.49)
Prothrombin Time: 12.3 seconds (ref 11.6–15.2)

## 2014-12-11 LAB — APTT: aPTT: 27 seconds (ref 24–37)

## 2014-12-11 SURGERY — CREATION, BYPASS, ARTERIAL, FEMORAL TO TIBIAL, USING GRAFT
Anesthesia: General | Site: Leg Upper | Laterality: Left

## 2014-12-11 SURGERY — ABDOMINAL AORTOGRAM

## 2014-12-11 MED ORDER — GLYCOPYRROLATE 0.2 MG/ML IJ SOLN
INTRAMUSCULAR | Status: AC
  Filled 2014-12-11: qty 2

## 2014-12-11 MED ORDER — NEOSTIGMINE METHYLSULFATE 10 MG/10ML IV SOLN
INTRAVENOUS | Status: DC | PRN
  Administered 2014-12-11: 3 mg via INTRAMUSCULAR

## 2014-12-11 MED ORDER — ALUM & MAG HYDROXIDE-SIMETH 200-200-20 MG/5ML PO SUSP: 15.0000 mL | ORAL | Status: DC | PRN

## 2014-12-11 MED ORDER — LACTATED RINGERS IV SOLN
INTRAVENOUS | Status: DC | PRN
  Administered 2014-12-11 (×2): via INTRAVENOUS

## 2014-12-11 MED ORDER — MORPHINE SULFATE (PF) 4 MG/ML IV SOLN
INTRAVENOUS | Status: AC
  Filled 2014-12-11: qty 1

## 2014-12-11 MED ORDER — METOPROLOL TARTRATE 1 MG/ML IV SOLN
2.0000 mg | INTRAVENOUS | Status: DC | PRN
  Administered 2014-12-14: 5 mg via INTRAVENOUS
  Filled 2014-12-11 (×2): qty 5

## 2014-12-11 MED ORDER — DOCUSATE SODIUM 100 MG PO CAPS
100.0000 mg | ORAL_CAPSULE | Freq: Every day | ORAL | Status: DC
  Administered 2014-12-12 – 2014-12-17 (×6): 100 mg via ORAL
  Filled 2014-12-11 (×9): qty 1

## 2014-12-11 MED ORDER — PHENOL 1.4 % MT LIQD: 1.0000 | OROMUCOSAL | Status: DC | PRN

## 2014-12-11 MED ORDER — PANTOPRAZOLE SODIUM 40 MG PO TBEC
40.0000 mg | DELAYED_RELEASE_TABLET | Freq: Every day | ORAL | Status: DC
  Administered 2014-12-11 – 2014-12-17 (×7): 40 mg via ORAL
  Filled 2014-12-11 (×7): qty 1

## 2014-12-11 MED ORDER — ONDANSETRON HCL 4 MG/2ML IJ SOLN: 4.0000 mg | Freq: Four times a day (QID) | INTRAMUSCULAR | Status: DC | PRN

## 2014-12-11 MED ORDER — DEXTROSE 5 % IV SOLN
INTRAVENOUS | Status: AC
  Administered 2014-12-11: 1.5 g via INTRAVENOUS
  Filled 2014-12-11: qty 1.5

## 2014-12-11 MED ORDER — NEOSTIGMINE METHYLSULFATE 10 MG/10ML IV SOLN: INTRAVENOUS | Status: DC | PRN

## 2014-12-11 MED ORDER — NEOSTIGMINE METHYLSULFATE 10 MG/10ML IV SOLN
INTRAVENOUS | Status: AC
  Filled 2014-12-11: qty 1

## 2014-12-11 MED ORDER — SODIUM CHLORIDE 0.9 % IR SOLN
Status: DC | PRN
  Administered 2014-12-11: 500 mL

## 2014-12-11 MED ORDER — HYDROMORPHONE HCL 1 MG/ML IJ SOLN
INTRAMUSCULAR | Status: AC
  Filled 2014-12-11: qty 1

## 2014-12-11 MED ORDER — HEPARIN SODIUM (PORCINE) 1000 UNIT/ML IJ SOLN
INTRAMUSCULAR | Status: AC
  Filled 2014-12-11: qty 1

## 2014-12-11 MED ORDER — MIDAZOLAM HCL 2 MG/2ML IJ SOLN
INTRAMUSCULAR | Status: DC | PRN
  Administered 2014-12-11: 1 mg via INTRAVENOUS

## 2014-12-11 MED ORDER — ACETAMINOPHEN 325 MG PO TABS: 325.0000 mg | ORAL_TABLET | ORAL | Status: DC | PRN

## 2014-12-11 MED ORDER — LISINOPRIL 5 MG PO TABS
5.0000 mg | ORAL_TABLET | Freq: Every day | ORAL | Status: DC
  Administered 2014-12-11 – 2014-12-17 (×7): 5 mg via ORAL
  Filled 2014-12-11 (×10): qty 1

## 2014-12-11 MED ORDER — PROTAMINE SULFATE 10 MG/ML IV SOLN
INTRAVENOUS | Status: DC | PRN
  Administered 2014-12-11: 50 mg via INTRAVENOUS

## 2014-12-11 MED ORDER — MIDAZOLAM HCL 2 MG/2ML IJ SOLN
INTRAMUSCULAR | Status: AC
  Filled 2014-12-11: qty 4

## 2014-12-11 MED ORDER — SUCCINYLCHOLINE CHLORIDE 20 MG/ML IJ SOLN
INTRAMUSCULAR | Status: DC | PRN
  Administered 2014-12-11: 120 mg via INTRAVENOUS

## 2014-12-11 MED ORDER — NICOTINE 14 MG/24HR TD PT24
14.0000 mg | MEDICATED_PATCH | Freq: Every day | TRANSDERMAL | Status: DC
  Administered 2014-12-11 – 2014-12-16 (×6): 14 mg via TRANSDERMAL
  Filled 2014-12-11 (×9): qty 1

## 2014-12-11 MED ORDER — HYDRALAZINE HCL 20 MG/ML IJ SOLN
INTRAMUSCULAR | Status: AC
  Filled 2014-12-11: qty 1

## 2014-12-11 MED ORDER — LIDOCAINE HCL (PF) 1 % IJ SOLN
INTRAMUSCULAR | Status: AC
  Filled 2014-12-11: qty 30

## 2014-12-11 MED ORDER — FENTANYL CITRATE (PF) 100 MCG/2ML IJ SOLN
INTRAMUSCULAR | Status: DC | PRN
  Administered 2014-12-11: 50 ug via INTRAVENOUS
  Administered 2014-12-11: 150 ug via INTRAVENOUS
  Administered 2014-12-11 (×2): 100 ug via INTRAVENOUS
  Administered 2014-12-11: 50 ug via INTRAVENOUS
  Administered 2014-12-11 (×3): 100 ug via INTRAVENOUS

## 2014-12-11 MED ORDER — METFORMIN HCL 500 MG PO TABS
500.0000 mg | ORAL_TABLET | Freq: Two times a day (BID) | ORAL | Status: DC
  Administered 2014-12-14 – 2014-12-17 (×7): 500 mg via ORAL
  Filled 2014-12-11 (×13): qty 1

## 2014-12-11 MED ORDER — HEPARIN (PORCINE) IN NACL 100-0.45 UNIT/ML-% IJ SOLN
800.0000 [IU]/h | INTRAMUSCULAR | Status: DC
  Administered 2014-12-11 – 2014-12-13 (×2): 800 [IU]/h via INTRAVENOUS
  Filled 2014-12-11 (×3): qty 250

## 2014-12-11 MED ORDER — POTASSIUM CHLORIDE CRYS ER 20 MEQ PO TBCR
20.0000 meq | EXTENDED_RELEASE_TABLET | Freq: Every day | ORAL | Status: AC | PRN
  Administered 2014-12-12: 20 meq via ORAL
  Filled 2014-12-11: qty 1

## 2014-12-11 MED ORDER — ALBUTEROL SULFATE HFA 108 (90 BASE) MCG/ACT IN AERS
INHALATION_SPRAY | RESPIRATORY_TRACT | Status: AC
  Filled 2014-12-11: qty 6.7

## 2014-12-11 MED ORDER — OXYCODONE HCL 5 MG PO TABS
5.0000 mg | ORAL_TABLET | ORAL | Status: DC | PRN
  Administered 2014-12-11 – 2014-12-14 (×12): 10 mg via ORAL
  Filled 2014-12-11 (×12): qty 2

## 2014-12-11 MED ORDER — MIDAZOLAM HCL 2 MG/2ML IJ SOLN
INTRAMUSCULAR | Status: AC
  Filled 2014-12-11: qty 2

## 2014-12-11 MED ORDER — METOPROLOL TARTRATE 1 MG/ML IV SOLN: 2.0000 mg | INTRAVENOUS | Status: DC | PRN

## 2014-12-11 MED ORDER — SODIUM CHLORIDE 0.9 % IV SOLN: 500.0000 mL | Freq: Once | INTRAVENOUS | Status: DC | PRN

## 2014-12-11 MED ORDER — MIDAZOLAM HCL 2 MG/2ML IJ SOLN
INTRAMUSCULAR | Status: AC
Start: 2014-12-11 — End: 2014-12-11
  Filled 2014-12-11: qty 4

## 2014-12-11 MED ORDER — SENNOSIDES-DOCUSATE SODIUM 8.6-50 MG PO TABS
1.0000 | ORAL_TABLET | Freq: Every evening | ORAL | Status: DC | PRN
  Filled 2014-12-11: qty 1

## 2014-12-11 MED ORDER — MORPHINE SULFATE (PF) 2 MG/ML IV SOLN
2.0000 mg | INTRAVENOUS | Status: DC | PRN
  Administered 2014-12-11 – 2014-12-12 (×4): 4 mg via INTRAVENOUS
  Filled 2014-12-11 (×4): qty 2

## 2014-12-11 MED ORDER — LIDOCAINE HCL (CARDIAC) 20 MG/ML IV SOLN
INTRAVENOUS | Status: AC
  Filled 2014-12-11: qty 5

## 2014-12-11 MED ORDER — FENTANYL CITRATE (PF) 100 MCG/2ML IJ SOLN
INTRAMUSCULAR | Status: DC | PRN
  Administered 2014-12-11: 25 ug via INTRAVENOUS

## 2014-12-11 MED ORDER — LACTATED RINGERS IV SOLN
INTRAVENOUS | Status: DC | PRN
  Administered 2014-12-11: 14:00:00 via INTRAVENOUS

## 2014-12-11 MED ORDER — HYDROMORPHONE HCL 1 MG/ML IJ SOLN
0.2500 mg | INTRAMUSCULAR | Status: DC | PRN
  Administered 2014-12-11 (×3): 0.5 mg via INTRAVENOUS

## 2014-12-11 MED ORDER — MAGNESIUM SULFATE 2 GM/50ML IV SOLN
2.0000 g | Freq: Every day | INTRAVENOUS | Status: DC | PRN
  Filled 2014-12-11: qty 50

## 2014-12-11 MED ORDER — ONDANSETRON HCL 4 MG/2ML IJ SOLN: 4.0000 mg | Freq: Once | INTRAMUSCULAR | Status: DC | PRN

## 2014-12-11 MED ORDER — HYDROMORPHONE HCL 1 MG/ML IJ SOLN
0.2500 mg | INTRAMUSCULAR | Status: DC | PRN
  Administered 2014-12-11 (×5): 0.5 mg via INTRAVENOUS

## 2014-12-11 MED ORDER — PROPOFOL 10 MG/ML IV BOLUS
INTRAVENOUS | Status: DC | PRN
  Administered 2014-12-11: 150 mg via INTRAVENOUS

## 2014-12-11 MED ORDER — INSULIN ASPART 100 UNIT/ML ~~LOC~~ SOLN: 0.0000 [IU] | Freq: Three times a day (TID) | SUBCUTANEOUS | Status: DC

## 2014-12-11 MED ORDER — DEXTROSE 5 % IV SOLN
1.5000 g | Freq: Two times a day (BID) | INTRAVENOUS | Status: AC
  Administered 2014-12-12 (×2): 1.5 g via INTRAVENOUS
  Filled 2014-12-11 (×4): qty 1.5

## 2014-12-11 MED ORDER — ROCURONIUM BROMIDE 100 MG/10ML IV SOLN
INTRAVENOUS | Status: DC | PRN
  Administered 2014-12-11: 30 mg via INTRAVENOUS
  Administered 2014-12-11: 20 mg via INTRAVENOUS

## 2014-12-11 MED ORDER — FENTANYL CITRATE (PF) 250 MCG/5ML IJ SOLN
INTRAMUSCULAR | Status: AC
  Filled 2014-12-11: qty 5

## 2014-12-11 MED ORDER — MIDAZOLAM HCL 2 MG/2ML IJ SOLN
1.0000 mg | Freq: Once | INTRAMUSCULAR | Status: AC
Start: 2014-12-11 — End: 2014-12-11
  Administered 2014-12-11: 2 mg via INTRAVENOUS

## 2014-12-11 MED ORDER — GUAIFENESIN-DM 100-10 MG/5ML PO SYRP: 15.0000 mL | ORAL_SOLUTION | ORAL | Status: DC | PRN

## 2014-12-11 MED ORDER — 0.9 % SODIUM CHLORIDE (POUR BTL) OPTIME
TOPICAL | Status: DC | PRN
  Administered 2014-12-11: 2000 mL

## 2014-12-11 MED ORDER — LABETALOL HCL 5 MG/ML IV SOLN: 10.0000 mg | INTRAVENOUS | Status: DC | PRN

## 2014-12-11 MED ORDER — PROPOFOL 10 MG/ML IV BOLUS
INTRAVENOUS | Status: AC
  Filled 2014-12-11: qty 20

## 2014-12-11 MED ORDER — SODIUM CHLORIDE 0.9 % IV SOLN: INTRAVENOUS | Status: DC | PRN

## 2014-12-11 MED ORDER — MORPHINE SULFATE (PF) 10 MG/ML IV SOLN
2.0000 mg | INTRAVENOUS | Status: DC | PRN
  Administered 2014-12-11: 4 mg via INTRAVENOUS

## 2014-12-11 MED ORDER — MAGNESIUM SULFATE 2 GM/50ML IV SOLN: 2.0000 g | Freq: Every day | INTRAVENOUS | Status: DC | PRN

## 2014-12-11 MED ORDER — IOHEXOL 300 MG/ML  SOLN
INTRAMUSCULAR | Status: DC | PRN
  Administered 2014-12-11: 27 mL via INTRAVENOUS

## 2014-12-11 MED ORDER — GLYCOPYRROLATE 0.2 MG/ML IJ SOLN
INTRAMUSCULAR | Status: DC | PRN
  Administered 2014-12-11: 0.4 mg via INTRAVENOUS

## 2014-12-11 MED ORDER — GLIMEPIRIDE 2 MG PO TABS
2.0000 mg | ORAL_TABLET | Freq: Every day | ORAL | Status: DC
  Administered 2014-12-12 – 2014-12-17 (×6): 2 mg via ORAL
  Filled 2014-12-11 (×7): qty 1

## 2014-12-11 MED ORDER — BISACODYL 10 MG RE SUPP: 10.0000 mg | Freq: Every day | RECTAL | Status: DC | PRN

## 2014-12-11 MED ORDER — FENTANYL CITRATE (PF) 100 MCG/2ML IJ SOLN
INTRAMUSCULAR | Status: AC
  Filled 2014-12-11: qty 4

## 2014-12-11 MED ORDER — HYDROMORPHONE HCL 1 MG/ML IJ SOLN
INTRAMUSCULAR | Status: AC
  Filled 2014-12-11: qty 2

## 2014-12-11 MED ORDER — ATORVASTATIN CALCIUM 80 MG PO TABS
80.0000 mg | ORAL_TABLET | Freq: Every day | ORAL | Status: DC
  Administered 2014-12-12 – 2014-12-16 (×5): 80 mg via ORAL
  Filled 2014-12-11 (×8): qty 1

## 2014-12-11 MED ORDER — PROMETHAZINE HCL 25 MG PO TABS: 12.5000 mg | ORAL_TABLET | Freq: Four times a day (QID) | ORAL | Status: DC | PRN

## 2014-12-11 MED ORDER — SODIUM CHLORIDE 0.45 % IV SOLN
INTRAVENOUS | Status: DC
  Administered 2014-12-11: 21:00:00 via INTRAVENOUS

## 2014-12-11 MED ORDER — HYDROMORPHONE HCL 1 MG/ML IJ SOLN
INTRAMUSCULAR | Status: AC
  Administered 2014-12-12: 1 mg via INTRAVENOUS
  Filled 2014-12-11: qty 2

## 2014-12-11 MED ORDER — HYDRALAZINE HCL 20 MG/ML IJ SOLN
5.0000 mg | INTRAMUSCULAR | Status: DC | PRN
  Filled 2014-12-11: qty 1

## 2014-12-11 MED ORDER — HEPARIN (PORCINE) IN NACL 2-0.9 UNIT/ML-% IJ SOLN
INTRAMUSCULAR | Status: AC
  Filled 2014-12-11: qty 1000

## 2014-12-11 MED ORDER — SODIUM CHLORIDE 0.9 % IV SOLN: INTRAVENOUS | Status: DC

## 2014-12-11 MED ORDER — SODIUM CHLORIDE 0.9 % IJ SOLN
INTRAMUSCULAR | Status: AC
  Filled 2014-12-11: qty 10

## 2014-12-11 MED ORDER — ACETAMINOPHEN 650 MG RE SUPP: 325.0000 mg | RECTAL | Status: DC | PRN

## 2014-12-11 MED ORDER — LACTATED RINGERS IV SOLN
Freq: Once | INTRAVENOUS | Status: AC
  Administered 2014-12-11: 15:00:00 via INTRAVENOUS

## 2014-12-11 MED ORDER — ALBUTEROL SULFATE HFA 108 (90 BASE) MCG/ACT IN AERS
INHALATION_SPRAY | RESPIRATORY_TRACT | Status: DC | PRN
  Administered 2014-12-11: 4 via RESPIRATORY_TRACT

## 2014-12-11 MED ORDER — PROTAMINE SULFATE 10 MG/ML IV SOLN
INTRAVENOUS | Status: AC
  Filled 2014-12-11: qty 5

## 2014-12-11 MED ORDER — HYDRALAZINE HCL 20 MG/ML IJ SOLN: 5.0000 mg | INTRAMUSCULAR | Status: DC | PRN

## 2014-12-11 MED ORDER — LIDOCAINE HCL (CARDIAC) 20 MG/ML IV SOLN
INTRAVENOUS | Status: DC | PRN
  Administered 2014-12-11: 60 mg via INTRAVENOUS

## 2014-12-11 MED ORDER — POTASSIUM CHLORIDE CRYS ER 20 MEQ PO TBCR: 20.0000 meq | EXTENDED_RELEASE_TABLET | Freq: Every day | ORAL | Status: DC | PRN

## 2014-12-11 MED ORDER — IODIXANOL 320 MG/ML IV SOLN
INTRAVENOUS | Status: DC | PRN
  Administered 2014-12-11: 79 mL via INTRA_ARTERIAL
  Administered 2014-12-11: 150 mL via INTRA_ARTERIAL

## 2014-12-11 SURGICAL SUPPLY — 61 items
BANDAGE ESMARK 6X9 LF (GAUZE/BANDAGES/DRESSINGS) IMPLANT
BNDG CMPR 9X6 STRL LF SNTH (GAUZE/BANDAGES/DRESSINGS)
BNDG ESMARK 6X9 LF (GAUZE/BANDAGES/DRESSINGS)
CANISTER SUCTION 2500CC (MISCELLANEOUS) ×4 IMPLANT
CATH EMB 3FR 80CM (CATHETERS) ×2 IMPLANT
CLIP TI MEDIUM 24 (CLIP) ×4 IMPLANT
CLIP TI WIDE RED SMALL 24 (CLIP) ×4 IMPLANT
CUFF TOURNIQUET SINGLE 24IN (TOURNIQUET CUFF) IMPLANT
CUFF TOURNIQUET SINGLE 34IN LL (TOURNIQUET CUFF) IMPLANT
DRAIN SNY 10X20 3/4 PERF (WOUND CARE) IMPLANT
DRAPE C-ARM MINI 42X72 WSTRAPS (DRAPES) ×2 IMPLANT
DRAPE PROXIMA HALF (DRAPES) IMPLANT
DRAPE X-RAY CASS 24X20 (DRAPES) IMPLANT
ELECT REM PT RETURN 9FT ADLT (ELECTROSURGICAL) ×4
ELECTRODE REM PT RTRN 9FT ADLT (ELECTROSURGICAL) ×2 IMPLANT
EVACUATOR SILICONE 100CC (DRAIN) IMPLANT
GAUZE SPONGE 4X4 12PLY STRL (GAUZE/BANDAGES/DRESSINGS) ×4 IMPLANT
GLOVE BIOGEL PI IND STRL 6.5 (GLOVE) IMPLANT
GLOVE BIOGEL PI IND STRL 7.5 (GLOVE) IMPLANT
GLOVE BIOGEL PI INDICATOR 6.5 (GLOVE) ×6
GLOVE BIOGEL PI INDICATOR 7.5 (GLOVE) ×2
GLOVE ECLIPSE 6.5 STRL STRAW (GLOVE) ×4 IMPLANT
GLOVE ECLIPSE 7.0 STRL STRAW (GLOVE) ×2 IMPLANT
GLOVE SS BIOGEL STRL SZ 7 (GLOVE) ×2 IMPLANT
GLOVE SUPERSENSE BIOGEL SZ 7 (GLOVE) ×2
GLOVE SURG SS PI 7.0 STRL IVOR (GLOVE) ×2 IMPLANT
GOWN STRL REUS W/ TWL LRG LVL3 (GOWN DISPOSABLE) ×6 IMPLANT
GOWN STRL REUS W/ TWL XL LVL3 (GOWN DISPOSABLE) IMPLANT
GOWN STRL REUS W/TWL LRG LVL3 (GOWN DISPOSABLE) ×16
GOWN STRL REUS W/TWL XL LVL3 (GOWN DISPOSABLE) ×4
INSERT FOGARTY SM (MISCELLANEOUS) ×4 IMPLANT
KIT BASIN OR (CUSTOM PROCEDURE TRAY) ×4 IMPLANT
KIT ROOM TURNOVER OR (KITS) ×4 IMPLANT
LIQUID BAND (GAUZE/BANDAGES/DRESSINGS) ×4 IMPLANT
NS IRRIG 1000ML POUR BTL (IV SOLUTION) ×8 IMPLANT
PACK PERIPHERAL VASCULAR (CUSTOM PROCEDURE TRAY) ×4 IMPLANT
PAD ARMBOARD 7.5X6 YLW CONV (MISCELLANEOUS) ×8 IMPLANT
PADDING CAST COTTON 6X4 STRL (CAST SUPPLIES) IMPLANT
SET COLLECT BLD 21X3/4 12 (NEEDLE) IMPLANT
SET COLLECT BLD 21X3/4 12 PB (MISCELLANEOUS) ×2 IMPLANT
STOPCOCK 4 WAY LG BORE MALE ST (IV SETS) ×2 IMPLANT
SUT PROLENE 5 0 C 1 24 (SUTURE) ×4 IMPLANT
SUT PROLENE 5 0 C1 (SUTURE) ×4 IMPLANT
SUT PROLENE 6 0 BV (SUTURE) ×8 IMPLANT
SUT PROLENE 6 0 C 1 24 (SUTURE) ×2 IMPLANT
SUT PROLENE 6 0 CC (SUTURE) ×4 IMPLANT
SUT PROLENE 7 0 BV 1 (SUTURE) ×2 IMPLANT
SUT PROLENE 7 0 BV1 MDA (SUTURE) ×2 IMPLANT
SUT SILK 2 0 SH (SUTURE) ×4 IMPLANT
SUT SILK 3 0 (SUTURE) ×4
SUT SILK 3-0 18XBRD TIE 12 (SUTURE) IMPLANT
SUT SILK 4 0 (SUTURE) ×4
SUT SILK 4-0 18XBRD TIE 12 (SUTURE) IMPLANT
SUT VIC AB 2-0 CTX 36 (SUTURE) ×8 IMPLANT
SUT VIC AB 3-0 SH 27 (SUTURE) ×20
SUT VIC AB 3-0 SH 27X BRD (SUTURE) ×4 IMPLANT
SYR TB 1ML LUER SLIP (SYRINGE) ×2 IMPLANT
TRAY FOLEY W/METER SILVER 16FR (SET/KITS/TRAYS/PACK) ×4 IMPLANT
TUBING EXTENTION W/L.L. (IV SETS) ×2 IMPLANT
UNDERPAD 30X30 INCONTINENT (UNDERPADS AND DIAPERS) ×4 IMPLANT
WATER STERILE IRR 1000ML POUR (IV SOLUTION) ×4 IMPLANT

## 2014-12-11 SURGICAL SUPPLY — 11 items
CATH ANGIO 5F BER 100CM (CATHETERS) ×1 IMPLANT
CATH OMNI FLUSH 5F 65CM (CATHETERS) ×1 IMPLANT
CATH STRAIGHT 5FR 65CM (CATHETERS) ×1 IMPLANT
COVER PRB 48X5XTLSCP FOLD TPE (BAG) IMPLANT
COVER PROBE 5X48 (BAG) ×2
KIT PV (KITS) ×2 IMPLANT
SHEATH PINNACLE 5F 10CM (SHEATH) ×1 IMPLANT
SYR MEDRAD MARK V 150ML (SYRINGE) ×2 IMPLANT
TRANSDUCER W/STOPCOCK (MISCELLANEOUS) ×2 IMPLANT
TRAY PV CATH (CUSTOM PROCEDURE TRAY) ×2 IMPLANT
WIRE BENTSON .035X145CM (WIRE) ×1 IMPLANT

## 2014-12-11 NOTE — ED Provider Notes (Signed)
CSN: [id number]     Arrival date & time 12/11/14  1129 History   First MD Initiated Contact with Patient 12/11/14 1138     Chief Complaint  Patient presents with  . Leg Pain     (Consider location/radiation/quality/duration/timing/severity/associated sxs/prior Treatment) Patient is a 44 y.o. female presenting with leg pain. The history is provided by the patient.  Leg Pain Associated symptoms: no back pain    patient presents with pain in her left lower leg. She woke up with this morning. Previous stenting and bypasses in her left lower leg. Called vascular surgery was told to come to the ER. I saw the patient in the room with Dr. Kellie Simmering. Woke up with her foot tingling. Somewhat painful and numb. States the tingling is improved. States her left great toe is now blue which is not normally. She used to be on Coumadin but is no longer on. No chest pain or currently. Has had some occasional episodes of chest pain. No fevers. No cough. No trauma.  Past Medical History  Diagnosis Date  . Hypertension   . Diabetes mellitus with peripheral vascular disease   . Hypercholesterolemia   . Tobacco abuse   . Coronary artery disease     NON OBSTRUCTIVE   Past Surgical History  Procedure Laterality Date  . Carpal tunnel release    . Cardiac catheterization    . Thrombectomy and revision of arterioventous (av) goretex  graft Left 08/16/2013    Procedure: Left below knee popliteal and peroneal thrombectomy; tibial and peroneal endaterectomy with patch angioplasty;  Surgeon: Conrad [place], MD;  Location: [facility];  Service: Vascular;  Laterality: Left;  . Abdominal angiogram N/A 08/16/2013    Procedure: ABDOMINAL ANGIOGRAM;  Surgeon: Conrad [place], MD;  Location: [facility] CATH LAB;  Service: Cardiovascular;  Laterality: N/A;  . Percutaneous stent intervention Left 08/16/2013    Procedure: PERCUTANEOUS STENT INTERVENTION;  Surgeon: Conrad [place]al[place], MD;  Location: [facility] CATH LAB;  Service: Cardiovascular;  Laterality: Left;   SFA  . Lower extremity angiogram N/A 04/22/2014    Procedure: LOWER EXTREMITY ANGIOGRAM;  Surgeon: Conrad Navarro, MD;  Location: [facility] CATH LAB;  Service: Cardiovascular;  Laterality: N/A;   Family History  Problem Relation Age of Onset  . Heart failure Mother   . Heart attack Mother   . Heart disease Father     s/p CABG  . Diabetes    . High blood pressure    . High Cholesterol    . Lung cancer Maternal Uncle    Social History  Substance Use Topics  . Smoking status: Light Tobacco Smoker -- 1.00 packs/day for 30 years    Types: Cigarettes  . Smokeless tobacco: Never Used  . Alcohol Use: No   OB History    No data available     Review of Systems  Constitutional: Negative for activity change and appetite change.  Eyes: Negative for pain.  Respiratory: Negative for chest tightness and shortness of breath.   Cardiovascular: Negative for chest pain and leg swelling.  Gastrointestinal: Negative for nausea, vomiting, abdominal pain and diarrhea.  Genitourinary: Negative for flank pain.  Musculoskeletal: Negative for back pain and neck stiffness.  Skin: Negative for rash.  Neurological: Positive for numbness. Negative for weakness and headaches.  Psychiatric/Behavioral: Negative for behavioral problems.      Allergies  Fish allergy; Prednisone; and Ibuprofen  Home Medications   Prior to Admission medications   Medication Sig Start Date End Date Taking?  Authorizing Provider  atorvastatin (LIPITOR) 80 MG tablet Take 1 tablet (80 mg total) by mouth daily at 6 PM. 04/03/14   Sharmon Leyden Nickel, NP  clopidogrel (PLAVIX) 75 MG tablet Take 1 tablet (75 mg total) by mouth daily. 04/22/14   Conrad [place], MD  enoxaparin (LOVENOX) 120 MG/0.8ML injection Inject 0.8 mLs (120 mg total) into the skin daily. Until INR greater than or equal to 2.0 Patient not taking: Reported on 08/09/2014 08/20/13   Delfina Redwood, MD  GLIMEPIRIDE PO Take 2 mg by mouth daily.     Historical Provider, MD   lisinopril (PRINIVIL,ZESTRIL) 5 MG tablet Take 1 tablet (5 mg total) by mouth daily. 08/20/13   Delfina Redwood, MD  metFORMIN (GLUCOPHAGE) 500 MG tablet Take 1 tablet (500 mg total) by mouth 2 (two) times daily with a meal. 08/20/13   Delfina Redwood, MD  oxyCODONE (OXY IR/ROXICODONE) 5 MG immediate release tablet Take 2 tablets (10 mg total) by mouth every 6 (six) hours as needed for moderate pain. Patient not taking: Reported on 08/09/2014 05/08/14   Conrad Hammond, MD  promethazine (PHENERGAN) 12.5 MG tablet Take 1 tablet (12.5 mg total) by mouth every 6 (six) hours as needed for nausea or vomiting. Patient not taking: Reported on 08/09/2014 08/20/13   Delfina Redwood, MD  traMADol (ULTRAM) 50 MG tablet Take 1 tablet (50 mg total) by mouth every 6 (six) hours as needed. 08/09/14   Conrad [place], MD  warfarin (COUMADIN) 5 MG tablet Take 1 tablet (5 mg total) by mouth daily. Or as directed Patient not taking: Reported on 08/09/2014 08/20/13   Delfina Redwood, MD   BP 128/57 mmHg  Pulse 64  Temp(Src) 97.8 F (36.6 C) (Oral)  Resp 13  SpO2 94%  LMP 12/10/2014 Physical Exam  Constitutional: She appears well-developed.  HENT:  Head: Normocephalic.  Eyes: EOM are normal.  Cardiovascular: Normal rate.   Pulmonary/Chest: Effort normal.  Abdominal: Soft. There is no tenderness.  Musculoskeletal: She exhibits tenderness.  Tenderness to left calf. Left foot much cooler than contralateral. Some cyanosis of left great toe. No palpable dorsalis pedis pulse. Bedside Doppler done by vascular surgery.  Skin: Skin is warm.    ED Course  Procedures (including critical care time) Labs Review Labs Reviewed  I-STAT CHEM 8, ED - Abnormal; Notable for the following:    Glucose, Bld 127 (*)    Hemoglobin 15.3 (*)    All other components within normal limits  CBC  APTT  PROTIME-INR  BASIC METABOLIC PANEL  HEPATIC FUNCTION PANEL    Imaging Review Dg Chest Portable 1 View  12/11/2014    CLINICAL DATA:  Blood clots in legs.  EXAM: PORTABLE CHEST - 1 VIEW  COMPARISON:  08/01/2007  FINDINGS: The cardiac silhouette, mediastinal and hilar contours are within normal limits and stable. The lungs are clear. No pleural effusion. The bony thorax is intact.  IMPRESSION: No acute cardiopulmonary findings.   Electronically Signed   By: Marijo Sanes M.D.   On: 12/11/2014 12:27   I have personally reviewed and evaluated these images and lab results as part of my medical decision-making.   EKG Interpretation None      MDM   Final diagnoses:  PAOD (peripheral arterial occlusive disease)    Patient with likely acute occlusion of artery and leg. Seen in the ER by myself and mask or surgery. Taken emergently to angiography.    Davonna Belling, MD 12/12/14 [phone]

## 2014-12-11 NOTE — ED Notes (Signed)
Dr. Kellie Simmering at bedside with handheld doppler

## 2014-12-11 NOTE — H&P (Signed)
Vascular Surgery H&P  Chief Complaint: Cold left leg below the knee with bluish discoloration of 2 toes  HPI: Toni Logan is a 44 y.o. female who presents for evaluation of circulation left leg. Patient has a long history of ongoing tobacco abuse 1-2 packs per day for 30+ years. She has had multiple procedures in her left leg by Dr. Adele Barthel. She has had previous stenting of her left superficial femoral artery as well as extensive endarterectomy of her left tibial peroneal trunk with patch angioplasty in May 2015. She reports severe claudication over the past week or so and awoke this morning with the left leg having a cold sensation and initially she had tingling in the toes of the left foot which did resolve. She noticed some bluish discoloration of the first toe. She denies rest pain in the left foot. She is not on Coumadin therapy now but she has been in the past. She does have diabetes mellitus type 2 and has had a diagnosis of nonobstructive coronary artery disease in the past.   Past Medical History  Diagnosis Date  . Hypertension   . Diabetes mellitus with peripheral vascular disease   . Hypercholesterolemia   . Tobacco abuse   . Coronary artery disease     NON OBSTRUCTIVE   Past Surgical History  Procedure Laterality Date  . Carpal tunnel release    . Cardiac catheterization    . Thrombectomy and revision of arterioventous (av) goretex  graft Left 08/16/2013    Procedure: Left below knee popliteal and peroneal thrombectomy; tibial and peroneal endaterectomy with patch angioplasty;  Surgeon: Conrad [place], MD;  Location: [place];  Service: Vascular;  Laterality: Left;  . Abdominal angiogram N/A 08/16/2013    Procedure: ABDOMINAL ANGIOGRAM;  Surgeon: Conrad [place], MD;  Location: [facility] CATH LAB;  Service: Cardiovascular;  Laterality: N/A;  . Percutaneous stent intervention Left 08/16/2013    Procedure: PERCUTANEOUS STENT INTERVENTION;  Surgeon: Conrad Baskin, MD;  Location: [facility] CATH LAB;   Service: Cardiovascular;  Laterality: Left;  SFA  . Lower extremity angiogram N/A 04/22/2014    Procedure: LOWER EXTREMITY ANGIOGRAM;  Surgeon: Conrad Kirkwood, MD;  Location: [facility] CATH LAB;  Service: Cardiovascular;  Laterality: N/A;   Social History   Social History  . Marital Status: Single    Spouse Name: N/A  . Number of Children: N/A  . Years of Education: N/A   Social History Main Topics  . Smoking status: Light Tobacco Smoker -- 1.00 packs/day for 30 years    Types: Cigarettes  . Smokeless tobacco: Never Used  . Alcohol Use: No  . Drug Use: No  . Sexual Activity: Not Asked   Other Topics Concern  . None   Social History Narrative   Lives with her wife in an apartment.  Does not use assist device, drives, independent for all ADLs   Family History  Problem Relation Age of Onset  . Heart failure Mother   . Heart attack Mother   . Heart disease Father     s/p CABG  . Diabetes    . High blood pressure    . High Cholesterol    . Lung cancer Maternal Uncle    Allergies  Allergen Reactions  . Fish Allergy Nausea And Vomiting  . Prednisone Other (See Comments)    Inflammation of  Kidney   . Ibuprofen Nausea And Vomiting   Prior to Admission medications   Medication Sig Start Date End Date Taking?  Authorizing Provider  atorvastatin (LIPITOR) 80 MG tablet Take 1 tablet (80 mg total) by mouth daily at 6 PM. 04/03/14   Sharmon Leyden Nickel, NP  clopidogrel (PLAVIX) 75 MG tablet Take 1 tablet (75 mg total) by mouth daily. 04/22/14   Conrad Kenwood, MD  enoxaparin (LOVENOX) 120 MG/0.8ML injection Inject 0.8 mLs (120 mg total) into the skin daily. Until INR greater than or equal to 2.0 Patient not taking: Reported on 08/09/2014 08/20/13   Delfina Redwood, MD  GLIMEPIRIDE PO Take 2 mg by mouth daily.     Historical Provider, MD  lisinopril (PRINIVIL,ZESTRIL) 5 MG tablet Take 1 tablet (5 mg total) by mouth daily. 08/20/13   Delfina Redwood, MD  metFORMIN (GLUCOPHAGE) 500 MG tablet  Take 1 tablet (500 mg total) by mouth 2 (two) times daily with a meal. 08/20/13   Delfina Redwood, MD  oxyCODONE (OXY IR/ROXICODONE) 5 MG immediate release tablet Take 2 tablets (10 mg total) by mouth every 6 (six) hours as needed for moderate pain. Patient not taking: Reported on 08/09/2014 05/08/14   Conrad Hayden, MD  promethazine (PHENERGAN) 12.5 MG tablet Take 1 tablet (12.5 mg total) by mouth every 6 (six) hours as needed for nausea or vomiting. Patient not taking: Reported on 08/09/2014 08/20/13   Delfina Redwood, MD  traMADol (ULTRAM) 50 MG tablet Take 1 tablet (50 mg total) by mouth every 6 (six) hours as needed. 08/09/14   Conrad [place], MD  warfarin (COUMADIN) 5 MG tablet Take 1 tablet (5 mg total) by mouth daily. Or as directed Patient not taking: Reported on 08/09/2014 08/20/13   Delfina Redwood, MD     Positive ROS: Denies ongoing chest discomfort, dyspnea on exertion, PND, orthopnea, hemoptysis. No symptoms in the right leg.  All other systems have been reviewed and were otherwise negative with the exception of those mentioned in the HPI and as above.  Physical Exam: Filed Vitals:   12/11/14 1230  BP: 128/57  Pulse: 64  Temp:   Resp: 13    General: Alert, no acute distress HEENT: Normal for age Cardiovascular: Regular rate and rhythm. Carotid pulses 2+, no bruits audible Respiratory: Clear to auscultation. No cyanosis, no use of accessory musculature GI: No organomegaly, abdomen is soft and non-tender Skin: No lesions in the area of chief complaint Neurologic: Sensation intact distally Psychiatric: Patient is competent for consent with normal mood and affect Musculoskeletal: No obvious deformities Extremities: Right leg with 3+ femoral 2+ popliteal 2+ posterior tibial pulse palpable Left leg with 2+ femoral pulse palpable. Popliteal and distal pulses are not palpable. There is brisk flow in the popliteal artery by Doppler. No easily heard Doppler flow in the left foot.  Left foot has intact sensation and motion. Loose discoloration left first toe without ulceration  Imaging reviewed: Previous angiograms performed by Dr. Bridgett Larsson were reviewed. Patient had significant tibial occlusive disease and tibioperoneal trunk disease at the time of the last procedure. She had previously undergone stenting of her left superficial femoral artery plus balloon angioplasty.   Assessment/Plan:  Suspected occlusion of the left below-knee popliteal artery or tibioperoneal trunk over the past week or so with recurrent claudication, cold sensation left foot, and bluish discoloration of some toes.  Plan angiography today by Dr. Ruta Hinds  with intervention if feasible and will then make appropriate plans. Discussed this with patient and she is in agreement   Tinnie Gens, MD 12/11/2014 12:47 PM

## 2014-12-11 NOTE — Anesthesia Preprocedure Evaluation (Addendum)
Anesthesia Evaluation  Patient identified by MRN, date of birth, ID band Patient awake    Reviewed: Allergy & Precautions, NPO status , Patient's Chart, lab work & pertinent test results  History of Anesthesia Complications Negative for: history of anesthetic complications  Airway Mallampati: II  TM Distance: >3 FB Neck ROM: Full    Dental no notable dental hx. (+) Teeth Intact, Dental Advisory Given   Pulmonary Current Smoker,    Pulmonary exam normal + wheezing      Cardiovascular hypertension, Pt. on medications + CAD and + Peripheral Vascular Disease Normal cardiovascular examRhythm:Regular Rate:Normal  5/15 TTE: EF 60-65%   Neuro/Psych negative neurological ROS     GI/Hepatic negative GI ROS, Neg liver ROS,   Endo/Other  diabetes, Type 2, Oral Hypoglycemic Agents  Renal/GU negative Renal ROS     Musculoskeletal negative musculoskeletal ROS (+)   Abdominal   Peds  Hematology negative hematology ROS (+)   Anesthesia Other Findings Day of surgery medications reviewed with the patient.  Reproductive/Obstetrics                         Anesthesia Physical Anesthesia Plan  ASA: III and emergent  Anesthesia Plan: General   Post-op Pain Management:    Induction: Intravenous  Airway Management Planned: Oral ETT  Additional Equipment:   Intra-op Plan:   Post-operative Plan: Extubation in OR  Informed Consent: I have reviewed the patients History and Physical, chart, labs and discussed the procedure including the risks, benefits and alternatives for the proposed anesthesia with the patient or authorized representative who has indicated his/her understanding and acceptance.   Dental advisory given  Plan Discussed with: CRNA  Anesthesia Plan Comments: (Risks/benefits of general anesthesia discussed with patient including risk of damage to teeth, lips, gum, and tongue, nausea/vomiting,  allergic reactions to medications, and the possibility of heart attack, stroke and death.  All patient questions answered.  Patient wishes to proceed.)        Anesthesia Quick Evaluation

## 2014-12-11 NOTE — Progress Notes (Signed)
Site area: rt groin Site Prior to Removal:  Level 0 Pressure Applied For:  20 minutes Manual:   yes Patient Status During Pull:  stable Post Pull Site:  Level  0 Post Pull Instructions Given:  yes Post Pull Pulses Pre Bedrest begins @ 1430: yes on rt Dressing Applied:  tegaderm Comments: consent signed prior to pain med for surgery. Patient talking to sister on cell phone

## 2014-12-11 NOTE — Interval H&P Note (Signed)
History and Physical Interval Note:  12/11/2014 2:42 PM  Toni Logan  has presented today for surgery, with the diagnosis of Cold Leg  The various methods of treatment have been discussed with the patient and family. After consideration of risks, benefits and other options for treatment, the patient has consented to  Procedure(s): BYPASS GRAFT FEMORAL-TIBIAL ARTERY (Left) as a surgical intervention .  The patient's history has been reviewed, patient examined, no change in status, stable for surgery.  I have reviewed the patient's chart and labs.  Questions were answered to the patient's satisfaction.     Tinnie Gens

## 2014-12-11 NOTE — ED Notes (Signed)
Portable at beside.

## 2014-12-11 NOTE — Transfer of Care (Signed)
Immediate Anesthesia Transfer of Care Note  Patient: Toni Logan  Procedure(s) Performed: Procedure(s):  LEFT COMMON FEMORAL-POSTERIOR TIBIAL ARTERY BYPASS USING SAPHEOUS VEIN GRAFT (Left) VEIN HARVEST LEFT GREATER SAPHENOUS VEIN (Left) INTRA OPERATIVE ARTERIOGRAM (Left)  Patient Location: PACU  Anesthesia Type:General  Level of Consciousness: awake, alert  and patient cooperative  Airway & Oxygen Therapy: Patient Spontanous Breathing and Patient connected to nasal cannula oxygen  Post-op Assessment: Report given to RN, Post -op Vital signs reviewed and stable and Patient moving all extremities  Post vital signs: Reviewed and stable  Last Vitals:  Filed Vitals:   12/11/14 1425  BP: 155/79  Pulse: 77  Temp:   Resp: 13    Complications: No apparent anesthesia complications

## 2014-12-11 NOTE — Telephone Encounter (Signed)
Phone call from pt.  Reported "extreme pain in left leg x 1-1.5 weeks."  Stated she woke up today, and the left leg is "ice cold" from the knee to the toes, and all the toes on her left foot are "bluish" in color.  Advised to go directly to [facility] ER.  Stated she will need to arrange transportation, and lives approx. 30 miles from [place].  Advised not to eat or drink anything until she has been evaluated at the hospital.  Stated she hasn't eaten since early yesterday.  Will notify the MD on call and the ER triage nurse of pt. coming to the hospital.

## 2014-12-11 NOTE — Anesthesia Postprocedure Evaluation (Signed)
  Anesthesia Post-op Note  Patient: Research scientist (life sciences)  Procedure(s) Performed: Procedure(s):  LEFT COMMON FEMORAL-POSTERIOR TIBIAL ARTERY BYPASS USING SAPHEOUS VEIN GRAFT (Left) VEIN HARVEST LEFT GREATER SAPHENOUS VEIN (Left) INTRA OPERATIVE ARTERIOGRAM (Left)  Patient Location: PACU  Anesthesia Type:General  Level of Consciousness: awake, alert , oriented and patient cooperative  Airway and Oxygen Therapy: Patient Spontanous Breathing and Patient connected to nasal cannula oxygen  Post-op Pain: moderate, improving  Post-op Assessment: Post-op Vital signs reviewed, Patient's Cardiovascular Status Stable, Respiratory Function Stable, Patent Airway, No signs of Nausea or vomiting and Pain level controlled              Post-op Vital Signs: Reviewed and stable  Last Vitals:  Filed Vitals:   12/11/14 2015  BP:   Pulse:   Temp: 36.8 C  Resp:     Complications: No apparent anesthesia complications

## 2014-12-11 NOTE — Op Note (Signed)
OPERATIVE REPORT  Date of Surgery: 12/11/2014  Surgeon: Tinnie Gens, MD  Assistant: Gerri Lins PA  Pre-op Diagnosis: Ischemic left leg secondary to occlusion of left superficial femoral artery stent and occlusion of the tibioperoneal trunk  Post-op Diagnosis: Same  Procedure: Procedure(s):  LEFT COMMON FEMORAL-POSTERIOR TIBIAL ARTERY BYPASS USING SAPHEOUS VEIN GRAFT--non-reversed-- VEIN HARVEST LEFT GREATER SAPHENOUS VEIN INTRA OPERATIVE ARTERIOGRAM  Anesthesia: General  EBL: 200 cc   The patient was taken to the operating room placed in supine position at which time satisfactory general endotracheal anesthesia was administered. The saphenous vein was imaged using B-mode ultrasound with the sono site and did seem to be adequate in caliber for vein grafting to the posterior tibial artery. After prepping and draping in routine sterile manner saphenous vein was exposed at the inguinal crease down to the lower third of the calf through multiple incisions on the medial aspect the left leg. Rash ligated with 3 and 4-0 silk ties and divided was removed gently dilated with heparinized saline and marked for orientation purposes. It was satisfactory but was smaller in the area where previous incision had been made and there was scar tissue adherent to it in the lower third. The posterior tibial artery was then exposed through the distal vein harvesting incision. It was flaccid and had no apparent flow noted. It was encircled with Vesseloops. This was at the junction of the proximal and the mid third of the leg. The common superficial and profunda femoris arteries were dissected free and the inguinal incision. There was diffuse calcific disease in the common femoral artery but it was soft anteriorly particular in the distal aspect. Patient was then heparinized femoral vessels occluded with vascular clamps longitudinal opening made in the common femoral artery and distal aspect with a 15 blade  extended with the Potts scissors. There was excellent inflow. Proximal and vein was spatulated and anastomosed end to side with 60 proline. Clamps released there was good pulse and the first set of competent valves. Using a retrograde valvulotome the valves were rendered incompetent with resultant satisfactory flow out the distal end of the vein graft. The vein was then carefully repositioned in its in situ tolerated and harvested and delivered into the distal wound. Posterior tibial artery was occluded proximally and distally opened 15 blade extended with Potts scissors. It did not have any atherosclerotic changes and would accept a 2-1/2 mm dilator proximally and distally but there was no inflow. Vein was carefully measured spatulated and anastomosed end to side with 60 proline. Clamps then released and there was a good pulse and initially obstructive sounding Doppler flow which then improved over the next 10 minutes to fairly brisk good sounding Doppler flow both at the anastomosis and at the ankle level. Intraoperative arteriogram was performed which right revealed a widely patent anastomosis into a small posterior tibial artery which filled down to the ankle level and then filled collaterals. Protamine was given to reverse the heparin following adequate hemostasis the wounds were irrigated with saline and closed in layers with 5-0 subcuticular fashion with Dermabond patient taken to recovery room in satisfactory condition Complications: None  Procedure Details:   Tinnie Gens, MD 12/11/2014 6:10 PM

## 2014-12-11 NOTE — ED Notes (Signed)
Pt last drink was right before she came at 1100am, last food yesterday

## 2014-12-11 NOTE — ED Notes (Signed)
Pt reports left leg pain and tenderness. Pt reports hx of blood clots in leg. Pt also reports that her leg was cool this morning when she woke up.

## 2014-12-11 NOTE — ED Notes (Signed)
Per Dr. Kellie Simmering, pt waiting to be admitted for arteriogram

## 2014-12-11 NOTE — Interval H&P Note (Signed)
History and Physical Interval Note:  12/11/2014 12:57 PM  Toni Logan  has presented today for surgery, with the diagnosis of cold leg  The various methods of treatment have been discussed with the patient and family. After consideration of risks, benefits and other options for treatment, the patient has consented to  Procedure(s): Abdominal Aortogram (N/A) as a surgical intervention .  The patient's history has been reviewed, patient examined, no change in status, stable for surgery.  I have reviewed the patient's chart and labs.  Questions were answered to the patient's satisfaction.     Ruta Hinds

## 2014-12-11 NOTE — Anesthesia Procedure Notes (Signed)
Procedure Name: Intubation Date/Time: 12/11/2014 2:57 PM Performed by: Izora Gala Pre-anesthesia Checklist: Patient identified, Emergency Drugs available, Suction available, Patient being monitored and Timeout performed Patient Re-evaluated:Patient Re-evaluated prior to inductionOxygen Delivery Method: Circle system utilized Preoxygenation: Pre-oxygenation with 100% oxygen Intubation Type: IV induction Ventilation: Mask ventilation without difficulty Laryngoscope Size: Miller and 3 Grade View: Grade I Tube type: Oral Tube size: 7.5 mm Number of attempts: 1 Airway Equipment and Method: Stylet and LTA kit utilized Placement Confirmation: ETT inserted through vocal cords under direct vision,  positive ETCO2 and breath sounds checked- equal and bilateral Secured at: 22 cm Dental Injury: Teeth and Oropharynx as per pre-operative assessment

## 2014-12-12 ENCOUNTER — Encounter (HOSPITAL_COMMUNITY): Payer: Self-pay | Admitting: Vascular Surgery

## 2014-12-12 ENCOUNTER — Encounter (HOSPITAL_COMMUNITY): Payer: Self-pay

## 2014-12-12 LAB — CBC
HCT: 34.9 % — ABNORMAL LOW (ref 36.0–46.0)
Hemoglobin: 11.5 g/dL — ABNORMAL LOW (ref 12.0–15.0)
MCH: 28.9 pg (ref 26.0–34.0)
MCHC: 33 g/dL (ref 30.0–36.0)
MCV: 87.7 fL (ref 78.0–100.0)
PLATELETS: 227 10*3/uL (ref 150–400)
RBC: 3.98 MIL/uL (ref 3.87–5.11)
RDW: 13.9 % (ref 11.5–15.5)
WBC: 11.6 10*3/uL — AB (ref 4.0–10.5)

## 2014-12-12 LAB — GLUCOSE, CAPILLARY
GLUCOSE-CAPILLARY: 84 mg/dL (ref 65–99)
GLUCOSE-CAPILLARY: 176 mg/dL — AB (ref 65–99)
GLUCOSE-CAPILLARY: 139 mg/dL — AB (ref 65–99)
Glucose-Capillary: 209 mg/dL — ABNORMAL HIGH (ref 65–99)
Glucose-Capillary: 160 mg/dL — ABNORMAL HIGH (ref 65–99)

## 2014-12-12 LAB — BASIC METABOLIC PANEL
Anion gap: 8 (ref 5–15)
CO2: 25 mmol/L (ref 22–32)
Calcium: 8.3 mg/dL — ABNORMAL LOW (ref 8.9–10.3)
Chloride: 101 mmol/L (ref 101–111)
Creatinine, Ser: 0.45 mg/dL (ref 0.44–1.00)
GFR calc Af Amer: >60 mL/min (ref >60)
Glucose, Bld: 182 mg/dL — ABNORMAL HIGH (ref 65–99)
POTASSIUM: 3.5 mmol/L (ref 3.5–5.1)
SODIUM: 134 mmol/L — AB (ref 135–145)

## 2014-12-12 LAB — HEPARIN LEVEL (UNFRACTIONATED)

## 2014-12-12 LAB — MRSA PCR SCREENING: MRSA by PCR: NEGATIVE

## 2014-12-12 LAB — APTT: APTT: 31 s (ref 24–37)

## 2014-12-12 MED ORDER — SODIUM CHLORIDE 0.9 % IV SOLN: INTRAVENOUS | Status: DC

## 2014-12-12 MED ORDER — HYDROMORPHONE HCL 1 MG/ML IJ SOLN
0.5000 mg | INTRAMUSCULAR | Status: DC | PRN
  Administered 2014-12-12 – 2014-12-13 (×15): 1 mg via INTRAVENOUS
  Administered 2014-12-13: 0.5 mg via INTRAVENOUS
  Administered 2014-12-13 – 2014-12-14 (×6): 1 mg via INTRAVENOUS
  Filled 2014-12-12 (×22): qty 1

## 2014-12-12 MED FILL — Lidocaine HCl Local Preservative Free (PF) Inj 1%: INTRAMUSCULAR | Qty: 30 | Status: AC

## 2014-12-12 MED FILL — Heparin Sodium (Porcine) 2 Unit/ML in Sodium Chloride 0.9%: INTRAMUSCULAR | Qty: 1000 | Status: AC

## 2014-12-12 MED FILL — Morphine Sulfate IV Soln PF 4 MG/ML: INTRAVENOUS | Qty: 1 | Status: AC

## 2014-12-12 NOTE — Progress Notes (Signed)
12/12/2014 4:36 PM Nursing note Pt. Refusing insulin administration at this time. CBG 176. Metformin contraindicated at this time due to procedure with contrast media.  Education on blood sugar control provided to patient. Will continue to closely monitor patient.  Toni Logan, [name]

## 2014-12-12 NOTE — Progress Notes (Addendum)
  [facility] Progress Note  Subjective  - POD #1  Numbness and pain of left foot resolved. Having pain with "entire leg" (left) and incisions  Objective Filed Vitals:   12/12/14 0300  BP: 107/46  Pulse: 89  Temp: 98.8 F (37.1 C)  Resp: 10    Intake/Output Summary (Last 24 hours) at 12/12/14 0728 Last data filed at 12/12/14 0324  Gross per 24 hour  Intake   2290 ml  Output   3050 ml  Net   -760 ml   Left leg incisions clean, dry and intact.  Right groin without hematoma Bluish discoloration of left great toe resolved. Dopplerable left DP and PT  Assessment/Planning: 44 y.o. female is s/p: left common femoral to posterior tibial bypass with non reversed great saphenous vein, arteriogram with left leg runoff 1 Day Post-Op   Bypass patent.  OOB to chair.  ?Continue heparin. Will d/w Dr. Kellie Simmering.  ABLA tolerating.  Transfer to [facility] 12/12/2014 7:28 AM --  Laboratory CBC    Component Value Date/Time   WBC 11.6* 12/12/2014 0303   HGB 11.5* 12/12/2014 0303   HCT 34.9* 12/12/2014 0303   PLT 227 12/12/2014 0303    BMET    Component Value Date/Time   NA 134* 12/12/2014 0303   K 3.5 12/12/2014 0303   CL 101 12/12/2014 0303   CO2 25 12/12/2014 0303   GLUCOSE 182* 12/12/2014 0303   BUN <5* 12/12/2014 0303   CREATININE 0.45 12/12/2014 0303   CALCIUM 8.3* 12/12/2014 0303   GFRNONAA >60 12/12/2014 0303   GFRAA >60 12/12/2014 0303    COAG Lab Results  Component Value Date   INR 0.89 12/11/2014   INR 0.98 04/22/2014   INR 1.68* 08/20/2013   No results found for: PTT  Antibiotics Anti-infectives    Start     Dose/Rate Route Frequency Ordered Stop   12/12/14 0300  cefUROXime (ZINACEF) 1.5 g in dextrose 5 % 50 mL IVPB     1.5 g 100 mL/hr over 30 Minutes Intravenous Every 12 hours 12/11/14 2036 12/13/14 0259   12/11/14 1446  dextrose 5 % with cefUROXime (ZINACEF) ADS Med    Comments:  Merryl Hacker   : cabinet override        12/11/14 1446 12/11/14 1513       Virgina Jock, PA-C [facility] Office: [phone] Pager: [phone] 12/12/2014 7:28 AM  Agree with above assessment Left foot well perfused Excellent posterior tibial Doppler flow and 3+ pulse palpable medially between incisions in the subcutaneous position Will continue heparin for 24 more hours and discontinued on Friday Patient can continue on daily aspirin rather than Plavix since stent has occluded  Transfer to 2 W. today Dr. Bridgett Larsson will follow patien  in my absence and hopefully she can be discharged in the next few days.

## 2014-12-12 NOTE — Progress Notes (Signed)
Received patient from [facility], alert & oriented, surgical sites clean, dry & intact.  Vital signs stable.  Pain free at this time.  Mervyn Skeeters, RN

## 2014-12-12 NOTE — Evaluation (Signed)
Physical Therapy Evaluation Patient Details Name: Toni Logan MRN: [id number] DOB: February 22, 1971 Today's Date: 12/12/2014   History of Present Illness  Pt admitted with ischemic Lt LE secondary to occlusion of Lt superficial femoral artery stent and occlusion of the tibioperoneal trunk.  She underwent Lt common Fem-pop tibial artery BPG.  PMH includes:  DM with PVD; tobacco abuse, CAD, HTN   Clinical Impression  Pt admitted with/for L LE BPG.  Pt currently limited functionally due to the problems listed below.  (see problems list.)  Pt will benefit from PT to maximize function and safety to be able to get home safely with available assist of family.     Follow Up Recommendations No PT follow up    Equipment Recommendations  None recommended by PT    Recommendations for Other Services       Precautions / Restrictions Precautions Precautions: Fall      Mobility  Bed Mobility Overal bed mobility: Needs Assistance Bed Mobility: Supine to Sit     Supine to sit: Min guard;HOB elevated Sit to supine: Min assist   General bed mobility comments: pt just needed extra time with limited L LE movement  Transfers Overall transfer level: Needs assistance Equipment used: Rolling walker (2 wheeled) Transfers: Sit to/from Stand Sit to Stand: Min assist            Ambulation/Gait Ambulation/Gait assistance: Min guard Ambulation Distance (Feet): 22 Feet Assistive device: Rolling walker (2 wheeled) Gait Pattern/deviations:  (generally swing to pattern, unable to w/b on L LE)     General Gait Details: swing to gait pattern; unable to put her weight on the L LE  Stairs            Wheelchair Mobility    Modified Rankin (Stroke Patients Only)       Balance                                             Pertinent Vitals/Pain Pain Assessment: Faces Pain Score: 8  Faces Pain Scale: Hurts even more Pain Location: L LE Pain Descriptors / Indicators:  Burning;Grimacing Pain Intervention(s): Premedicated before session    Home Living Family/patient expects to be discharged to:: Private residence Living Arrangements: Spouse/significant other;Other relatives Available Help at Discharge: Family;Available 24 hours/day Type of Home: House Home Access: Stairs to enter Entrance Stairs-Rails: Right Entrance Stairs-Number of Steps: 3 Home Layout: One level Home Equipment: Walker - 2 wheels;Bedside commode      Prior Function Level of Independence: Independent         Comments: Pt reports she was independent with ADLs PTA.  Drives, does not work      Journalist, newspaper   Dominant Hand: Right    Extremity/Trunk Assessment   Upper Extremity Assessment: RUE deficits/detail;LUE deficits/detail RUE Deficits / Details: Pt reports intermittent pins/needles sensation bil. hands that has been present for years      LUE Deficits / Details: Pt reports intermittent pins/needles sensation bil. hands that has been present for years    Lower Extremity Assessment: LLE deficits/detail         Communication   Communication: No difficulties  Cognition Arousal/Alertness: Awake/alert Behavior During Therapy: Anxious Overall Cognitive Status: Within Functional Limits for tasks assessed                      General Comments  General comments (skin integrity, edema, etc.): Encourage to do LAQ and pendulums on both legs at EOB    Exercises        Assessment/Plan    PT Assessment Patient needs continued PT services  PT Diagnosis Acute pain;Abnormality of gait   PT Problem List Decreased range of motion;Decreased mobility;Decreased knowledge of use of DME;Decreased knowledge of precautions;Pain  PT Treatment Interventions DME instruction;Gait training;Functional mobility training;Therapeutic activities;Therapeutic exercise;Patient/family education   PT Goals (Current goals can be found in the Care Plan section) Acute Rehab PT  Goals Patient Stated Goal: to have decreased pain  PT Goal Formulation: With patient Time For Goal Achievement: 12/19/14 Potential to Achieve Goals: Good    Frequency Min 3X/week   Barriers to discharge        Co-evaluation               End of Session   Activity Tolerance: Patient tolerated treatment well;Patient limited by pain Patient left: with family/visitor present;with call bell/phone within reach (sitting EOB) Nurse Communication: Mobility status         Time: 5701-7793 PT Time Calculation (min) (ACUTE ONLY): 27 min   Charges:   PT Evaluation $Initial PT Evaluation Tier I: 1 Procedure PT Treatments $Gait Training: 8-22 mins   PT G Codes:        [name], Tessie Fass 12/12/2014, 6:25 PM    12/12/2014  [name], [place] [phone]  (pager)

## 2014-12-12 NOTE — Progress Notes (Signed)
Pt transferred to 3s10 from PACU after report received from RN.  Pt currently has severe LLE pain w/ her HR and BP reflecting.  Pt has been oriented to room and surroundings with callbell within reach.  Will give pain meds and continue to monitor HR, BP and intervene if necessary.

## 2014-12-12 NOTE — Evaluation (Signed)
Occupational Therapy Evaluation Patient Details Name: Toni Logan MRN: [id number] DOB: Mar 20, 1971 Today's Date: 12/12/2014    History of Present Illness Pt admitted with ischemic Lt LE secondary to occlusion of Lt superficial femoral artery stent and occlusion of the tibioperoneal trunk.  She underwent Lt common Fem-pop tibial artery BPG.  PMH includes:  DM with PVD; tobacco abuse, CAD, HTN    Clinical Impression   Pt admitted with above. She demonstrates the below listed deficits and will benefit from continued OT to maximize safety and independence with BADLs.  Eval very limited due to pt c/o pain 8-9/10 (after pain meds).  Currently, she requires max - total A for LB ADLs and mod A +2 for functional transfers.   She reports she has adequate assist at home at discharge.  Anticipate that once pain improves, she will be able to discharge home at a level family can manage her; however, if progress is not as anticipated, she may need SNF.       Follow Up Recommendations  Home health OT;Supervision/Assistance - 24 hour    Equipment Recommendations  None recommended by OT    Recommendations for Other Services       Precautions / Restrictions Precautions Precautions: Fall      Mobility Bed Mobility Overal bed mobility: Needs Assistance Bed Mobility: Sit to Supine       Sit to supine: Min assist   General bed mobility comments: Min A to lift Lt LE onto bed   Transfers                      Balance                                            ADL Overall ADL's : Needs assistance/impaired Eating/Feeding: Independent;Bed level   Grooming: Wash/dry hands;Wash/dry face;Oral care;Brushing hair;Set up;Sitting   Upper Body Bathing: Set up;Sitting   Lower Body Bathing: Maximal assistance;Bed level   Upper Body Dressing : Minimal assistance;Bed level   Lower Body Dressing: Total assistance;Bed level   Toilet Transfer: Moderate assistance;+2 for  physical assistance;Stand-pivot;BSC   Toileting- Clothing Manipulation and Hygiene: Total assistance;+2 for physical assistance;Sit to/from stand       Functional mobility during ADLs: Moderate assistance;+2 for physical assistance General ADL Comments: Pt limited by pain.  Pt states she can't do anything due to pain without attempting.  Requires max encouragement for minimal participation      Vision     Perception     Praxis      Pertinent Vitals/Pain Pain Assessment: 0-10 Pain Score: 8  Pain Location: Lt LE  Pain Descriptors / Indicators: Aching;Constant Pain Intervention(s): Limited activity within patient's tolerance;Repositioned;Premedicated before session     Hand Dominance Right   Extremity/Trunk Assessment Upper Extremity Assessment Upper Extremity Assessment: RUE deficits/detail;LUE deficits/detail RUE Deficits / Details: Pt reports intermittent pins/needles sensation bil. hands that has been present for years  LUE Deficits / Details: Pt reports intermittent pins/needles sensation bil. hands that has been present for years    Lower Extremity Assessment Lower Extremity Assessment: Defer to PT evaluation       Communication Communication Communication: No difficulties   Cognition Arousal/Alertness: Awake/alert Behavior During Therapy: Anxious Overall Cognitive Status: Within Functional Limits for tasks assessed  General Comments       Exercises       Shoulder Instructions      Home Living Family/patient expects to be discharged to:: Private residence Living Arrangements: Spouse/significant other;Other relatives Available Help at Discharge: Family;Available 24 hours/day Type of Home: House Home Access: Stairs to enter [employer] of Steps: 3 Entrance Stairs-Rails: Right Home Layout: One level     Bathroom Shower/Tub: Occupational psychologist: Standard Bathroom Accessibility: Yes   Home  Equipment: Environmental consultant - 2 wheels;Bedside commode          Prior Functioning/Environment Level of Independence: Independent        Comments: Pt reports she was independent with ADLs PTA.  Drives, does not work     OT Diagnosis: Generalized weakness;Acute pain   OT Problem List: Decreased strength;Decreased activity tolerance;Impaired balance (sitting and/or standing);Decreased safety awareness;Decreased knowledge of use of DME or AE;Decreased knowledge of precautions;Pain   OT Treatment/Interventions: Self-care/ADL training;DME and/or AE instruction;Therapeutic activities;Patient/family education;Balance training    OT Goals(Current goals can be found in the care plan section) Acute Rehab OT Goals Patient Stated Goal: to have decreased pain  OT Goal Formulation: With patient Time For Goal Achievement: 12/26/14 Potential to Achieve Goals: Good ADL Goals Pt Will Perform Grooming: with min assist;standing Pt Will Perform Lower Body Bathing: with min assist;sit to/from stand;with adaptive equipment Pt Will Perform Lower Body Dressing: with min assist;sit to/from stand;with adaptive equipment Pt Will Transfer to Toilet: with min assist;ambulating;regular height toilet;bedside commode;grab bars Pt Will Perform Toileting - Clothing Manipulation and hygiene: with min assist;sit to/from stand  OT Frequency: Min 2X/week   Barriers to D/C:            Co-evaluation              End of Session Nurse Communication: Mobility status  Activity Tolerance: Patient limited by pain Patient left: in bed;with call bell/phone within reach;with bed alarm set   Time: 1515-1531 OT Time Calculation (min): 16 min Charges:  OT General Charges $OT Visit: 1 Procedure OT Evaluation $Initial OT Evaluation Tier I: 1 Procedure G-Codes:    Lucille Passy M 2015/01/03, 3:52 PM

## 2014-12-12 NOTE — Plan of Care (Signed)
Problem: Phase I Progression Outcomes Goal: Vascular site scale level 0 - I Vascular Site Scale Level 0: No bruising/bleeding/hematoma Level I (Mild): Bruising/Ecchymosis, minimal bleeding/ooozing, palpable hematoma < 3 cm Level II (Moderate): Bleeding not affecting hemodynamic parameters, pseudoaneurysm, palpable hematoma > 3 cm Level III (Severe) Bleeding which affects hemodynamic parameters or retroperitoneal hemorrhage  Outcome: Completed/Met Date Met:  12/12/14 Groin site level 0 Goal: Pain controlled with appropriate interventions Outcome: Progressing Patient requiring frequent pain medication, will continue to monitor Goal: Voiding-avoid urinary catheter unless indicated Outcome: Completed/Met Date Met:  12/12/14 Foley d/c this AM patient voiding on own now

## 2014-12-13 ENCOUNTER — Inpatient Hospital Stay (HOSPITAL_COMMUNITY): Payer: MEDICAID

## 2014-12-13 LAB — GLUCOSE, CAPILLARY
GLUCOSE-CAPILLARY: 103 mg/dL — AB (ref 65–99)
GLUCOSE-CAPILLARY: 105 mg/dL — AB (ref 65–99)
GLUCOSE-CAPILLARY: 118 mg/dL — AB (ref 65–99)
Glucose-Capillary: 140 mg/dL — ABNORMAL HIGH (ref 65–99)

## 2014-12-13 LAB — HEMOGLOBIN A1C
HEMOGLOBIN A1C: 7.8 % — AB (ref 4.8–5.6)
Mean Plasma Glucose: 177 mg/dL

## 2014-12-13 LAB — APTT: aPTT: 36 seconds (ref 24–37)

## 2014-12-13 LAB — CBC
HEMATOCRIT: 34.8 % — AB (ref 36.0–46.0)
HEMOGLOBIN: 11.3 g/dL — AB (ref 12.0–15.0)
MCH: 28.7 pg (ref 26.0–34.0)
MCHC: 32.5 g/dL (ref 30.0–36.0)
MCV: 88.3 fL (ref 78.0–100.0)
Platelets: 229 10*3/uL (ref 150–400)
RBC: 3.94 MIL/uL (ref 3.87–5.11)
RDW: 14.1 % (ref 11.5–15.5)
WBC: 7.2 10*3/uL (ref 4.0–10.5)

## 2014-12-13 LAB — HEPARIN LEVEL (UNFRACTIONATED): Heparin Unfractionated: <0.1 IU/mL — ABNORMAL LOW (ref 0.30–0.70)

## 2014-12-13 MED ORDER — CYCLOBENZAPRINE HCL 5 MG PO TABS
5.0000 mg | ORAL_TABLET | Freq: Three times a day (TID) | ORAL | Status: DC | PRN
  Administered 2014-12-13 – 2014-12-16 (×5): 5 mg via ORAL
  Filled 2014-12-13 (×7): qty 1

## 2014-12-13 MED ORDER — ENOXAPARIN SODIUM 40 MG/0.4ML ~~LOC~~ SOLN
40.0000 mg | SUBCUTANEOUS | Status: DC
  Administered 2014-12-14 – 2014-12-16 (×3): 40 mg via SUBCUTANEOUS
  Filled 2014-12-13 (×6): qty 0.4

## 2014-12-13 NOTE — Progress Notes (Signed)
Physical Therapy Treatment Patient Details Name: Toni Logan MRN: [id number] DOB: May 13, 1970 Today's Date: 12/13/2014    History of Present Illness Pt admitted with ischemic Lt LE secondary to occlusion of Lt superficial femoral artery stent and occlusion of the tibioperoneal trunk.  She underwent Lt common Fem-pop tibial artery BPG.  PMH includes:  DM with PVD; tobacco abuse, CAD, HTN     PT Comments    Pt progressing slowly towards physical therapy goals. Appears unsteady and therefore unsafe throughout mobility, however pt grossly requires only close guard for safety, and min assist to stand. Overall, moves very slow and guarded and is unwilling to attempt weight bearing during gait training. Pt complaining of cramping in the LLE. Discussed with RN who states she will check for the possibility of getting a muscle relaxer ordered. Feel that pt could benefit from follow-up therapy at d/c for continued mobility training.   Follow Up Recommendations  Home health PT;Supervision for mobility/OOB     Equipment Recommendations  None recommended by PT    Recommendations for Other Services       Precautions / Restrictions Precautions Precautions: Fall Restrictions Weight Bearing Restrictions: No    Mobility  Bed Mobility Overal bed mobility: Needs Assistance Bed Mobility: Supine to Sit     Supine to sit: Supervision;HOB elevated     General bed mobility comments: Increased time required for pt to transition to EOB. She moved her LLE along with hands and was heavily reliant on bed rails. Did not have to physically assist pt.   Transfers Overall transfer level: Needs assistance Equipment used: Rolling walker (2 wheeled) Transfers: Sit to/from Stand Sit to Stand: Min assist         General transfer comment: Assist required to power-up to full standing position. Pt trying to keep L foot off floor during transfers which was largely limiting her ability to balance and push to  standing.   Ambulation/Gait Ambulation/Gait assistance: Min guard Ambulation Distance (Feet): 75 Feet Assistive device: Rolling walker (2 wheeled) Gait Pattern/deviations: Step-through pattern;Decreased stride length;Trunk flexed Gait velocity: Very slow Gait velocity interpretation: Below normal speed for age/gender General Gait Details: Pt was cued throughout gait training to start attempting weight bearing on the LLE. Initially she refused to try however by end of gait training she was resting ball of foot down on ground before lifting it back up to advance the RLE.    Stairs            Wheelchair Mobility    Modified Rankin (Stroke Patients Only)       Balance Overall balance assessment: Needs assistance Sitting-balance support: Feet supported;No upper extremity supported Sitting balance-Leahy Scale: Fair     Standing balance support: Bilateral upper extremity supported;During functional activity Standing balance-Leahy Scale: Poor                      Cognition Arousal/Alertness: Awake/alert Behavior During Therapy: Anxious Overall Cognitive Status: Within Functional Limits for tasks assessed                      Exercises      General Comments        Pertinent Vitals/Pain Pain Assessment: Faces Faces Pain Scale: Hurts even more Pain Location: LLE - at rest. Appeared increased with mobility Pain Descriptors / Indicators: Operative site guarding Pain Intervention(s): Limited activity within patient's tolerance;Monitored during session;Premedicated before session    Home Living  Prior Function            PT Goals (current goals can now be found in the care plan section) Acute Rehab PT Goals Patient Stated Goal: to have decreased pain  PT Goal Formulation: With patient Time For Goal Achievement: 12/19/14 Potential to Achieve Goals: Good Progress towards PT goals: Progressing toward goals     Frequency  Min 3X/week    PT Plan Discharge plan needs to be updated    Co-evaluation             End of Session Equipment Utilized During Treatment: Gait belt Activity Tolerance: Patient limited by pain Patient left: in bed;with call bell/phone within reach (Sitting EOB - refused recliner)     Time: 9390-3009 PT Time Calculation (min) (ACUTE ONLY): 27 min  Charges:  $Gait Training: 8-22 mins $Therapeutic Activity: 8-22 mins                    G Codes:      Rolinda Roan 2015/01/04, 1:42 PM   Rolinda Roan, PT, DPT Acute Rehabilitation Services Pager: [phone]

## 2014-12-13 NOTE — Progress Notes (Addendum)
  [facility] Progress Note  Subjective  - POD #2  Continuing to have pain with left leg. Says back hurts after trying to walk yesterday  Objective Filed Vitals:   12/13/14 0306  BP: 117/55  Pulse: 80  Temp: 98.4 F (36.9 C)  Resp: 20    Intake/Output Summary (Last 24 hours) at 12/13/14 0801 Last data filed at 12/13/14 0321  Gross per 24 hour  Intake   1517 ml  Output   1075 ml  Net    442 ml   Left leg incisions healing well. Severe pain with palpation to entire left leg. Calf is soft.  Left foot is warm and pink. No doppler available right now. Will check back later.   Assessment/Planning: 44 y.o. female is s/p: left common femoral to posterior tibial bypass with non reversed great saphenous vein, arteriogram with left leg runoff 2 Days Post-Op   -Slowly progressing. Pain not well controlled and not ambulating secondary to pain.  -Left foot well perfused.  -Continue to mobilize. -Discontinue heparin today.  -on ASA -Anticipate d/c when pain better controlled and mobilizing more. Hopefully this weekend.    Alvia Grove 12/13/2014 8:01 AM --  Laboratory CBC    Component Value Date/Time   WBC 7.2 12/13/2014 0352   HGB 11.3* 12/13/2014 0352   HCT 34.8* 12/13/2014 0352   PLT 229 12/13/2014 0352    BMET    Component Value Date/Time   NA 134* 12/12/2014 0303   K 3.5 12/12/2014 0303   CL 101 12/12/2014 0303   CO2 25 12/12/2014 0303   GLUCOSE 182* 12/12/2014 0303   BUN <5* 12/12/2014 0303   CREATININE 0.45 12/12/2014 0303   CALCIUM 8.3* 12/12/2014 0303   GFRNONAA >60 12/12/2014 0303   GFRAA >60 12/12/2014 0303    COAG Lab Results  Component Value Date   INR 0.89 12/11/2014   INR 0.98 04/22/2014   INR 1.68* 08/20/2013   No results found for: PTT  Antibiotics Anti-infectives    Start     Dose/Rate Route Frequency Ordered Stop   12/12/14 0300  cefUROXime (ZINACEF) 1.5 g in dextrose 5 % 50 mL IVPB     1.5 g 100 mL/hr over 30  Minutes Intravenous Every 12 hours 12/11/14 2036 12/12/14 1540   12/11/14 1446  dextrose 5 % with cefUROXime (ZINACEF) ADS Med    Comments:  Merryl Hacker   : cabinet override      12/11/14 1446 12/11/14 [place], PA-C [facility] Office: [phone] Pager: [phone] 12/13/2014 8:01 AM  Addendum  I have independently interviewed and examined the patient, and I agree with the physician assistant's findings.  Pt has significant spinal issues that have resulted in radiculopathy in the past.  I'm not certain how much of the left foot sx are the radiculopathy vs ischemia.  She has a strong PT signal with strong AT or DP signals: c/w with prior studies.  I reiterated again the need to quit smoking as she has significant residual disease.  Suspect she will be here through the weekend, as titration of her pain regimen may be difficult.  Awaiting ABI.  Cont PT.  Adele Barthel, MD [facility] [place] Office: [phone] Pager: [phone]  12/13/2014, 11:59 AM

## 2014-12-13 NOTE — Progress Notes (Signed)
Occupational Therapy Treatment Patient Details Name: Toni Logan MRN: [id number] DOB: 1970/07/20 Today's Date: 12/13/2014    History of present illness Pt admitted with ischemic Lt LE secondary to occlusion of Lt superficial femoral artery stent and occlusion of the tibioperoneal trunk.  She underwent Lt common Fem-pop tibial artery BPG.  PMH includes:  DM with PVD; tobacco abuse, CAD, HTN    OT comments  Pt much more participatory today.   Able to ambulate to BR and perform grooming standing at sink.  She requires mod A for LB dressing due to inability to access Lt foot due to pain.   Will continue to follow.   Follow Up Recommendations  Supervision/Assistance - 24 hour;No OT follow up    Equipment Recommendations  None recommended by OT    Recommendations for Other Services      Precautions / Restrictions Precautions Precautions: Fall Restrictions Weight Bearing Restrictions: No       Mobility Bed Mobility Overal bed mobility: Needs Assistance Bed Mobility: Supine to Sit     Supine to sit: Supervision;HOB elevated     General bed mobility comments: requires increased time   Transfers Overall transfer level: Needs assistance Equipment used: Rolling walker (2 wheeled) Transfers: Sit to/from [facility] Sit to Stand: Min guard Stand pivot transfers: Min guard       General transfer comment: Assist required to power-up to full standing position. Pt trying to keep L foot off floor during transfers which was largely limiting her ability to balance and push to standing.     Balance Overall balance assessment: Needs assistance Sitting-balance support: Feet supported Sitting balance-Leahy Scale: Fair     Standing balance support: During functional activity;Single extremity supported Standing balance-Leahy Scale: Poor                     ADL Overall ADL's : Needs assistance/impaired     Grooming: Wash/dry hands;Wash/dry face;Oral  care;Brushing hair;Min guard;Standing       Lower Body Bathing: Minimal assistance;Sit to/from stand       Lower Body Dressing: Moderate assistance;Sit to/from stand Lower Body Dressing Details (indicate cue type and reason): able to doff both socks seated, but unable to don Lt sock  Toilet Transfer: Min guard;Ambulation;Comfort height toilet;Grab bars;RW   Toileting- Water quality scientist and Hygiene: Min guard;Sit to/from stand       Functional mobility during ADLs: Min guard;Rolling walker General ADL Comments: Pt only able to tolerate minimal weight bearing on Lt foot       Vision                     Perception     Praxis      Cognition   Behavior During Therapy: [facility] for tasks assessed/performed Overall Cognitive Status: Within Functional Limits for tasks assessed                       Extremity/Trunk Assessment               Exercises     Shoulder Instructions       General Comments      Pertinent Vitals/ Pain       Pain Assessment: Faces Faces Pain Scale: Hurts even more Pain Location: Lt LE  Pain Descriptors / Indicators: Aching;Burning;Constant;Grimacing;Guarding Pain Intervention(s): Monitored during session  Home Living  Prior Functioning/Environment              Frequency Min 2X/week     Progress Toward Goals  OT Goals(current goals can now be found in the care plan section)  Progress towards OT goals: Progressing toward goals  Acute Rehab OT Goals Patient Stated Goal: to have decreased pain  ADL Goals Pt Will Perform Grooming: with min assist;standing Pt Will Perform Lower Body Bathing: with min assist;sit to/from stand;with adaptive equipment Pt Will Perform Lower Body Dressing: with min assist;sit to/from stand;with adaptive equipment Pt Will Transfer to Toilet: with min assist;ambulating;regular height toilet;bedside commode;grab bars Pt Will  Perform Toileting - Clothing Manipulation and hygiene: with min assist;sit to/from stand  Plan Discharge plan needs to be updated    Co-evaluation                 End of Session     Activity Tolerance Patient limited by pain   Patient Left in bed;with call bell/phone within reach   Nurse Communication Mobility status        Time: 5110-2111 OT Time Calculation (min): 25 min  Charges: OT General Charges $OT Visit: 1 Procedure OT Treatments $Self Care/Home Management : 23-37 mins  Trevel Dillenbeck M 12/13/2014, 3:21 PM

## 2014-12-14 ENCOUNTER — Inpatient Hospital Stay (INDEPENDENT_AMBULATORY_CARE_PROVIDER_SITE_OTHER): Payer: Self-pay

## 2014-12-14 DIAGNOSIS — Z9889 Other specified postprocedural states: Secondary | ICD-10-CM

## 2014-12-14 LAB — CBC
HEMATOCRIT: 33.2 % — AB (ref 36.0–46.0)
HEMOGLOBIN: 11.1 g/dL — AB (ref 12.0–15.0)
MCH: 29.4 pg (ref 26.0–34.0)
MCHC: 33.4 g/dL (ref 30.0–36.0)
MCV: 88.1 fL (ref 78.0–100.0)
Platelets: 195 10*3/uL (ref 150–400)
RBC: 3.77 MIL/uL — ABNORMAL LOW (ref 3.87–5.11)
RDW: 14.1 % (ref 11.5–15.5)
WBC: 9.9 10*3/uL (ref 4.0–10.5)

## 2014-12-14 LAB — GLUCOSE, CAPILLARY
GLUCOSE-CAPILLARY: 125 mg/dL — AB (ref 65–99)
Glucose-Capillary: 121 mg/dL — ABNORMAL HIGH (ref 65–99)
Glucose-Capillary: 68 mg/dL (ref 65–99)
Glucose-Capillary: 148 mg/dL — ABNORMAL HIGH (ref 65–99)

## 2014-12-14 MED ORDER — OXYCODONE HCL 5 MG PO TABS
5.0000 mg | ORAL_TABLET | ORAL | Status: DC | PRN
  Administered 2014-12-14 – 2014-12-15 (×10): 10 mg via ORAL
  Administered 2014-12-16 (×2): 5 mg via ORAL
  Administered 2014-12-16: 10 mg via ORAL
  Filled 2014-12-14 (×8): qty 2
  Filled 2014-12-14: qty 1
  Filled 2014-12-14 (×3): qty 2
  Filled 2014-12-14: qty 1

## 2014-12-14 MED ORDER — HYDROMORPHONE HCL 1 MG/ML IJ SOLN
0.5000 mg | INTRAMUSCULAR | Status: DC | PRN
  Administered 2014-12-14 – 2014-12-16 (×10): 1 mg via INTRAVENOUS
  Filled 2014-12-14 (×10): qty 1

## 2014-12-14 MED ORDER — KETOROLAC TROMETHAMINE 15 MG/ML IJ SOLN
15.0000 mg | Freq: Four times a day (QID) | INTRAMUSCULAR | Status: DC
  Administered 2014-12-14 – 2014-12-16 (×9): 15 mg via INTRAVENOUS
  Filled 2014-12-14 (×17): qty 1

## 2014-12-14 NOTE — Progress Notes (Signed)
VASCULAR LAB PRELIMINARY  ARTERIAL  ABI completed:    RIGHT    LEFT    PRESSURE WAVEFORM  PRESSURE WAVEFORM  BRACHIAL 102 Triphasic BRACHIAL 104 Triphasic  DP 88 Biphasic DP 95 Biphasic  PT 95 Triphasic PT 85 Monophasic    RIGHT LEFT  ABI 0.91 0.91   ABIs indicate a mild reduction in arterial flow bilaterally at rest.  Toni Logan, RVS 12/14/2014, 1:49 PM

## 2014-12-14 NOTE — Progress Notes (Addendum)
  [facility] Progress Note  Subjective  - POD #3  Pain not well controlled. Says she wants to be woken up to receive pain medicine because she is in significant pain when she wakes up. Wants to have po and IV pain medicine at the same time.   Objective Filed Vitals:   12/14/14 0505  BP: 136/62  Pulse: 78  Temp: 98.2 F (36.8 C)  Resp: 18    Intake/Output Summary (Last 24 hours) at 12/14/14 0726 Last data filed at 12/14/14 0320  Gross per 24 hour  Intake   1080 ml  Output   2200 ml  Net  -1120 ml   Left leg incisions healing well Brisk left PT and DP doppler signals.   Assessment/Planning: 44 y.o. female is s/p: left common femoral to posterior tibial bypass with non reversed great saphenous vein, arteriogram with left leg runoff 3 Days Post-Op   Pain management difficult. Will need to wean off dilaudid. Will try toradol.  Good doppler flow to left foot. Continue mobilization. D/c when mobilizing well and pain controlled. Anticipate d/c early next week.   Alvia Grove 12/14/2014 7:26 AM --  Laboratory CBC    Component Value Date/Time   WBC 9.9 12/14/2014 0333   HGB 11.1* 12/14/2014 0333   HCT 33.2* 12/14/2014 0333   PLT 195 12/14/2014 0333    BMET    Component Value Date/Time   NA 134* 12/12/2014 0303   K 3.5 12/12/2014 0303   CL 101 12/12/2014 0303   CO2 25 12/12/2014 0303   GLUCOSE 182* 12/12/2014 0303   BUN <5* 12/12/2014 0303   CREATININE 0.45 12/12/2014 0303   CALCIUM 8.3* 12/12/2014 0303   GFRNONAA >60 12/12/2014 0303   GFRAA >60 12/12/2014 0303    COAG Lab Results  Component Value Date   INR 0.89 12/11/2014   INR 0.98 04/22/2014   INR 1.68* 08/20/2013   No results found for: PTT  Antibiotics Anti-infectives    Start     Dose/Rate Route Frequency Ordered Stop   12/12/14 0300  cefUROXime (ZINACEF) 1.5 g in dextrose 5 % 50 mL IVPB     1.5 g 100 mL/hr over 30 Minutes Intravenous Every 12 hours 12/11/14 2036 12/12/14  1540   12/11/14 1446  dextrose 5 % with cefUROXime (ZINACEF) ADS Med    Comments:  Merryl Hacker   : cabinet override      12/11/14 1446 12/11/14 [place], PA-C [facility] Office: [phone] Pager: [phone] 12/14/2014 7:26 AM  Agree with above. Because of her back issues, she is slow to mobilize. Home once she is mobile, hopefully Monday.  Deitra Mayo, MD, [place] [phone]

## 2014-12-14 NOTE — Progress Notes (Signed)
Pt requested both IV and po pain medication at the same time. RN educated patient that it is not appropriate for her care to give both at the same time as to not over sedate the patient. Pt reluctant to accept this and continued to ask to be woken up for IV narcotic. RN educated pt that if pt was sleeping pt would appear to be in no pain. Pt must request pain medication. Pt verbalized understanding,but visibly upset. HR in 150s and BP elevated. RN gave PRN metoprolol.   Will continue to monitor.  Earlie Lou

## 2014-12-15 LAB — CBC
HEMATOCRIT: 35.4 % — AB (ref 36.0–46.0)
HEMOGLOBIN: 11.5 g/dL — AB (ref 12.0–15.0)
MCH: 28.6 pg (ref 26.0–34.0)
MCHC: 32.5 g/dL (ref 30.0–36.0)
MCV: 88.1 fL (ref 78.0–100.0)
Platelets: 224 10*3/uL (ref 150–400)
RBC: 4.02 MIL/uL (ref 3.87–5.11)
RDW: 13.9 % (ref 11.5–15.5)
WBC: 5.6 10*3/uL (ref 4.0–10.5)

## 2014-12-15 LAB — GLUCOSE, CAPILLARY
GLUCOSE-CAPILLARY: 108 mg/dL — AB (ref 65–99)
Glucose-Capillary: 122 mg/dL — ABNORMAL HIGH (ref 65–99)
Glucose-Capillary: 148 mg/dL — ABNORMAL HIGH (ref 65–99)
Glucose-Capillary: 72 mg/dL (ref 65–99)

## 2014-12-15 MED ORDER — GABAPENTIN 300 MG PO CAPS
300.0000 mg | ORAL_CAPSULE | Freq: Three times a day (TID) | ORAL | Status: DC
  Administered 2014-12-15 – 2014-12-17 (×4): 300 mg via ORAL
  Filled 2014-12-15 (×9): qty 1

## 2014-12-15 NOTE — Progress Notes (Addendum)
  [facility] Progress Note  Subjective  - POD #4  Pain improving. Complaining of burning pain in foot.   Objective Filed Vitals:   12/15/14 0455  BP: 116/56  Pulse: 78  Temp: 98 F (36.7 C)  Resp: 17    Intake/Output Summary (Last 24 hours) at 12/15/14 0727 Last data filed at 12/15/14 0435  Gross per 24 hour  Intake      0 ml  Output   1400 ml  Net  -1400 ml   Left leg groin and medial leg incisions clean, dry and intact. Strong doppler flow left PT and DP  Assessment/Planning: 44 y.o. female is s/p: left common femoral to posterior tibial bypass with non reversed great saphenous vein, arteriogram with left leg runoff 4 Days Post-Op   Pain starting to improve. Will add gabapentin for neuropathic pain.  Keep left leg elevated above level of the heart. Continue to mobilize. Hopefully d/c tomorrow.   DVT prophylaxis:  Lovenox Alvia Grove 12/15/2014 7:27 AM --  Laboratory CBC    Component Value Date/Time   WBC 5.6 12/15/2014 0450   HGB 11.5* 12/15/2014 0450   HCT 35.4* 12/15/2014 0450   PLT 224 12/15/2014 0450    BMET    Component Value Date/Time   NA 134* 12/12/2014 0303   K 3.5 12/12/2014 0303   CL 101 12/12/2014 0303   CO2 25 12/12/2014 0303   GLUCOSE 182* 12/12/2014 0303   BUN <5* 12/12/2014 0303   CREATININE 0.45 12/12/2014 0303   CALCIUM 8.3* 12/12/2014 0303   GFRNONAA >60 12/12/2014 0303   GFRAA >60 12/12/2014 0303    COAG Lab Results  Component Value Date   INR 0.89 12/11/2014   INR 0.98 04/22/2014   INR 1.68* 08/20/2013   No results found for: PTT  Antibiotics Anti-infectives    Start     Dose/Rate Route Frequency Ordered Stop   12/12/14 0300  cefUROXime (ZINACEF) 1.5 g in dextrose 5 % 50 mL IVPB     1.5 g 100 mL/hr over 30 Minutes Intravenous Every 12 hours 12/11/14 2036 12/12/14 1540   12/11/14 1446  dextrose 5 % with cefUROXime (ZINACEF) ADS Med    Comments:  Merryl Hacker   : cabinet override   12/11/14 1446 12/11/14 [place], PA-C [facility] Office: [phone] Pager: [phone] 12/15/2014 7:27 AM  Agree with above. ABI on left is 91%. Probable discharge tomorrow.   Deitra Mayo, MD, [place] [phone]

## 2014-12-15 NOTE — Progress Notes (Signed)
Patient ambulated in hallway with front wheel walker approximately 75 feet.  Feels tired and wants to lay down. Pt resting with call bell within reach.  Will continue to monitor. Payton Emerald, RN

## 2014-12-15 NOTE — Progress Notes (Signed)
Patient ambulated in hallway with front wheel walker approximately 100 feet.  No problems noted. Pt resting with call bell within reach.  Will continue to monitor. Payton Emerald, RN

## 2014-12-16 LAB — CBC
HEMATOCRIT: 34.3 % — AB (ref 36.0–46.0)
Hemoglobin: 11.4 g/dL — ABNORMAL LOW (ref 12.0–15.0)
MCH: 29.3 pg (ref 26.0–34.0)
MCHC: 33.2 g/dL (ref 30.0–36.0)
MCV: 88.2 fL (ref 78.0–100.0)
PLATELETS: 239 10*3/uL (ref 150–400)
RBC: 3.89 MIL/uL (ref 3.87–5.11)
RDW: 13.8 % (ref 11.5–15.5)
WBC: 5.5 10*3/uL (ref 4.0–10.5)

## 2014-12-16 LAB — GLUCOSE, CAPILLARY
GLUCOSE-CAPILLARY: 98 mg/dL (ref 65–99)
GLUCOSE-CAPILLARY: 81 mg/dL (ref 65–99)
GLUCOSE-CAPILLARY: 129 mg/dL — AB (ref 65–99)
Glucose-Capillary: 98 mg/dL (ref 65–99)

## 2014-12-16 MED ORDER — OXYCODONE HCL 5 MG PO TABS
10.0000 mg | ORAL_TABLET | ORAL | Status: DC | PRN
  Administered 2014-12-16 (×4): 15 mg via ORAL
  Filled 2014-12-16 (×4): qty 3

## 2014-12-16 MED ORDER — OXYCODONE HCL 5 MG PO TABS: 10.0000 mg | ORAL_TABLET | ORAL | Status: DC | PRN

## 2014-12-16 MED ORDER — ASPIRIN EC 81 MG PO TBEC: 81.0000 mg | DELAYED_RELEASE_TABLET | Freq: Every day | ORAL | Status: DC

## 2014-12-16 MED ORDER — OXYCODONE HCL 5 MG PO TABS
10.0000 mg | ORAL_TABLET | ORAL | Status: DC | PRN
  Administered 2014-12-16 – 2014-12-17 (×3): 15 mg via ORAL
  Filled 2014-12-16 (×3): qty 3

## 2014-12-16 MED ORDER — HYDROMORPHONE HCL 1 MG/ML IJ SOLN
0.5000 mg | INTRAMUSCULAR | Status: DC | PRN
  Administered 2014-12-16: 0.5 mg via INTRAVENOUS
  Administered 2014-12-16 (×3): 1 mg via INTRAVENOUS
  Administered 2014-12-16: 0.5 mg via INTRAVENOUS
  Administered 2014-12-17 (×2): 1 mg via INTRAVENOUS
  Filled 2014-12-16 (×7): qty 1

## 2014-12-16 MED ORDER — OXYCODONE HCL 5 MG PO TABS: 10.0000 mg | ORAL_TABLET | Freq: Four times a day (QID) | ORAL | Status: DC | PRN

## 2014-12-16 NOTE — Progress Notes (Addendum)
  [facility] Progress Note  Subjective  - POD #5  Pain better. Has been ambulating.   Objective Filed Vitals:   12/16/14 0454  BP: 130/75  Pulse: 71  Temp: 97.7 F (36.5 C)  Resp: 18    Intake/Output Summary (Last 24 hours) at 12/16/14 0732 Last data filed at 12/16/14 0450  Gross per 24 hour  Intake      0 ml  Output   1450 ml  Net  -1450 ml   Postoperative swelling left leg Incisions healing well Brisk left pedal doppler signals  Assessment/Planning: 44 y.o. female is s/p: left common femoral to posterior tibial bypass with non reversed great saphenous vein, arteriogram with left leg runoff 5 Days Post-Op   -Pain improving -Last day of toradol. Discontinue dilaudid. Increase po dosage. -Continue to mobilize -DVT prophylaxis:  lovenox -Discharge home today.  Case management for home health PT.  Toni Logan 12/16/2014 7:32 AM --  Laboratory CBC    Component Value Date/Time   WBC 5.6 12/15/2014 0450   HGB 11.5* 12/15/2014 0450   HCT 35.4* 12/15/2014 0450   PLT 224 12/15/2014 0450    BMET    Component Value Date/Time   NA 134* 12/12/2014 0303   K 3.5 12/12/2014 0303   CL 101 12/12/2014 0303   CO2 25 12/12/2014 0303   GLUCOSE 182* 12/12/2014 0303   BUN <5* 12/12/2014 0303   CREATININE 0.45 12/12/2014 0303   CALCIUM 8.3* 12/12/2014 0303   GFRNONAA >60 12/12/2014 0303   GFRAA >60 12/12/2014 0303    COAG Lab Results  Component Value Date   INR 0.89 12/11/2014   INR 0.98 04/22/2014   INR 1.68* 08/20/2013   No results found for: PTT  Antibiotics Anti-infectives    Start     Dose/Rate Route Frequency Ordered Stop   12/12/14 0300  cefUROXime (ZINACEF) 1.5 g in dextrose 5 % 50 mL IVPB     1.5 g 100 mL/hr over 30 Minutes Intravenous Every 12 hours 12/11/14 2036 12/12/14 1540   12/11/14 1446  dextrose 5 % with cefUROXime (ZINACEF) ADS Med    Comments:  Toni Logan   : cabinet override      12/11/14 1446 12/11/14 [place], PA-C [facility] Office: [phone] Pager: [phone] 12/16/2014 7:32 AM   As above. She required IV Dilaudid so here discharge was cancelled. Should be ready to go tomorrow.  Toni Mayo, MD, [place] [phone] Office: [phone]

## 2014-12-16 NOTE — Progress Notes (Signed)
Physical Therapy Treatment Patient Details Name: Toni Logan MRN: [id number] DOB: 11-Apr-1971 Today's Date: 12/16/2014    History of Present Illness Pt admitted with ischemic Lt LE secondary to occlusion of Lt superficial femoral artery stent and occlusion of the tibioperoneal trunk.  She underwent Lt common Fem-pop tibial artery BPG.  PMH includes:  DM with PVD; tobacco abuse, CAD, HTN     PT Comments    Pt progressing towards physical therapy goals. Although she is able to mobilize with the RW better than previous session, pt continues to refuse weightbearing through the LLE. She was educated on benefits of proper technique during early mobilization, however pt declined to try during ambulation, and declined seated weightbearing activity. Pt has not attempted stair training with therapy due to increased pain. Scheduled therapy after pain meds this morning, and pt states she is still at a 9/10, and requesting more pain medication at end of session. Will continue to follow and progress as able per POC.   Follow Up Recommendations  Home health PT;Supervision for mobility/OOB     Equipment Recommendations  None recommended by PT    Recommendations for Other Services       Precautions / Restrictions Precautions Precautions: Fall Restrictions Weight Bearing Restrictions: No Other Position/Activity Restrictions: Pt is self nonweightbearing through the LLE. Even with cues and education she refuses to put weight through the LLE.     Mobility  Bed Mobility               General bed mobility comments: Pt recevied sitting on EOB  Transfers Overall transfer level: Needs assistance Equipment used: Rolling walker (2 wheeled) Transfers: Sit to/from Stand Sit to Stand: Supervision         General transfer comment: Pt did not require assistance to power-up to full standing. Pt appears unsafe and does not make corrective changes when cued to place hands on seated surface for safety.    Ambulation/Gait Ambulation/Gait assistance: Supervision Ambulation Distance (Feet): 75 Feet Assistive device: Rolling walker (2 wheeled) Gait Pattern/deviations: Step-to pattern;Decreased stride length;Trunk flexed Gait velocity: Quick pace, rushing; overall slow Gait velocity interpretation: Below normal speed for age/gender General Gait Details: Pt appeared to be rushing through gait training and keeping her LLE up off the floor. She was cued throughout ambulation to start attempting weightbearing, even if it is just touchdown. Pt hovering LLE over the floor and slides it along only. Pt refuses to attempt any more.    Stairs            Wheelchair Mobility    Modified Rankin (Stroke Patients Only)       Balance Overall balance assessment: Needs assistance Sitting-balance support: Feet supported;No upper extremity supported Sitting balance-Leahy Scale: Fair     Standing balance support: Bilateral upper extremity supported;During functional activity Standing balance-Leahy Scale: Poor                      Cognition Arousal/Alertness: Awake/alert Behavior During Therapy: WFL for tasks assessed/performed Overall Cognitive Status: Within Functional Limits for tasks assessed                      Exercises General Exercises - Lower Extremity Long Arc Quad: 10 reps    General Comments        Pertinent Vitals/Pain Pain Assessment: 0-10 Pain Score: 9  Pain Location: LLE Pain Descriptors / Indicators: Operative site guarding Pain Intervention(s): Limited activity within patient's tolerance;Monitored during session;Repositioned;Patient requesting pain  meds-RN notified    Home Living                      Prior Function            PT Goals (current goals can now be found in the care plan section) Acute Rehab PT Goals Patient Stated Goal: to have decreased pain  PT Goal Formulation: With patient Time For Goal Achievement:  12/19/14 Potential to Achieve Goals: Good Progress towards PT goals: Progressing toward goals    Frequency  Min 3X/week    PT Plan Current plan remains appropriate    Co-evaluation             End of Session Equipment Utilized During Treatment: Gait belt Activity Tolerance: Patient limited by pain Patient left: with call bell/phone within reach;Other (comment) (Sitting EOB)     Time: 1594-5859 PT Time Calculation (min) (ACUTE ONLY): 11 min  Charges:  $Gait Training: 8-22 mins                    G Codes:      Rolinda Roan 2015-01-10, 10:23 AM  Rolinda Roan, PT, DPT Acute Rehabilitation Services Pager: [phone]

## 2014-12-16 NOTE — Progress Notes (Addendum)
Occupational Therapy Treatment Patient Details Name: Toni Logan MRN: [id number] DOB: 08-14-1970 Today's Date: 12/16/2014    History of present illness Pt admitted with ischemic Lt LE secondary to occlusion of Lt superficial femoral artery stent and occlusion of the tibioperoneal trunk.  She underwent Lt common Fem-pop tibial artery BPG.  PMH includes:  DM with PVD; tobacco abuse, CAD, HTN    OT comments  Pt progressing. Education provided in session.  Follow Up Recommendations  No OT follow up;Supervision/Assistance - 24 hour    Equipment Recommendations  None recommended by OT    Recommendations for Other Services      Precautions / Restrictions Precautions Precautions: Fall Restrictions Weight Bearing Restrictions: No       Mobility Bed Mobility               General bed mobility comments: not assessed  Transfers Overall transfer level: Needs assistance   Transfers: Sit to/from Stand Sit to Stand/Stand to Sit: Modified independent (Device/Increase time);Supervision         General transfer comment: cues for position of walker.    Balance    Used RW for ambulation.                               ADL Overall ADL's : Needs assistance/impaired                     Lower Body Dressing: Supervision/safety;Sitting/lateral leans (donned/doffed left sock)   Toilet Transfer: Supervision/safety;RW;Ambulation (sit to stand from bed)           Functional mobility during ADLs: Supervision/safety;Rolling walker General ADL Comments: Educated on shower transfer techniques. Educated on safety such as sitting for LB ADLs, safe footwear, and use of bag on walker. Recommended someone be with her for shower transfer.  Cues for pt to try to put weight through LLE. Educated on LB dressing technique.       Vision                     Perception     Praxis      Cognition  Awake/Alert Behavior During Therapy: WFL for tasks  assessed/performed Overall Cognitive Status:  (unsure of baseline-decreased safety awareness)                       Extremity/Trunk Assessment               Exercises     Shoulder Instructions       General Comments      Pertinent Vitals/ Pain       Pain Assessment: 0-10 Pain Score: 9  Pain Location: LLE Pain Descriptors / Indicators: Constant;Throbbing;Sharp Pain Intervention(s): Monitored during session  HR up to 120.  Home Living                                          Prior Functioning/Environment              Frequency Min 2X/week     Progress Toward Goals  OT Goals(current goals can now be found in the care plan section)  Progress towards OT goals: Progressing toward goals  Acute Rehab OT Goals Patient Stated Goal: not stated OT Goal Formulation: With patient Time For Goal Achievement: 12/26/14 Potential to Achieve  Goals: Good ADL Goals Pt Will Perform Grooming: with min assist;standing Pt Will Perform Lower Body Bathing: with min assist;sit to/from stand;with adaptive equipment Pt Will Perform Lower Body Dressing: with min assist;sit to/from stand;with adaptive equipment Pt Will Transfer to Toilet: with min assist;ambulating;regular height toilet;bedside commode;grab bars Pt Will Perform Toileting - Clothing Manipulation and hygiene: with min assist;sit to/from stand  Plan Discharge plan remains appropriate    Co-evaluation                 End of Session Equipment Utilized During Treatment: Gait belt;Rolling walker   Activity Tolerance Patient tolerated treatment well   Patient Left in bed;with call bell/phone within reach;with family/visitor present   Nurse Communication          Time: 3614-4315 OT Time Calculation (min): 16 min  Charges: OT General Charges $OT Visit: 1 Procedure OT Treatments $Self Care/Home Management : 8-22 mins  Benito Mccreedy OTR/L [phone] 12/16/2014, 5:36 PM

## 2014-12-16 NOTE — Care Management Note (Signed)
Case Management Note  Patient Details  Name: Toni Logan MRN: [id number] Date of Birth: July 18, 1970  Subjective/Objective:      Pt will discharge home with [place] following.  Pt does not meet criteria for home health PT but [facility] will arrange home health RN and SW visits.  Pt has no insurance but may qualify for the [facility] indigent program.                          Expected Discharge Plan:  [place]  In-House Referral:  Financial Counselor  Choice offered to:  Patient  HH Arranged:  RN [facility] Agency:  [place]  Status of Service:  Completed, signed off  Girard Cooter, [place] 12/16/2014, 9:38 AM

## 2014-12-16 NOTE — Progress Notes (Addendum)
Pt distal incision site oozing small amount of bloody drainage at the distal end. Spoke with PA Dellie Catholic, some swelling in calf but pt has had.  Pt encouraged to elevate leg on pillows and swelling already improved. +doppler pulses. Does not look like compartment syndrome but will continue to monitor closely.Gauze and kurlex applied and will cont to monitor.  PA also gave verbal order to change oxycodone q3 hours to q4hours in preparation for discharge in am.  Toni Logan

## 2014-12-17 LAB — CBC
HCT: 34.3 % — ABNORMAL LOW (ref 36.0–46.0)
Hemoglobin: 11.1 g/dL — ABNORMAL LOW (ref 12.0–15.0)
MCH: 28.7 pg (ref 26.0–34.0)
MCHC: 32.4 g/dL (ref 30.0–36.0)
MCV: 88.6 fL (ref 78.0–100.0)
PLATELETS: 260 10*3/uL (ref 150–400)
RBC: 3.87 MIL/uL (ref 3.87–5.11)
RDW: 13.8 % (ref 11.5–15.5)
WBC: 6.4 10*3/uL (ref 4.0–10.5)

## 2014-12-17 LAB — GLUCOSE, CAPILLARY: GLUCOSE-CAPILLARY: 95 mg/dL (ref 65–99)

## 2014-12-17 NOTE — Care Management Note (Signed)
Case Management Note  Patient Details  Name: Toni Logan MRN: [id number] Date of Birth: 1971/03/03  Subjective/Objective:      Pt will discharge home with [place] following.  Pt does not meet criteria for home health PT but [facility] will arrange home health RN and SW visits.  Pt has no insurance but may qualify for the [facility] indigent program.                          Expected Discharge Plan:  [place]  In-House Referral:  Financial Counselor  Choice offered to:  Patient  HH Arranged:  RN, SW [facility] Agency:  [place]  Status of Service:  Completed, signed off  Additonal Comments: CM assessed pt prior to discharge.  Pt stated she has not been able to see her primary doctor "in a while" due to them requiring cash.  CM informed pt that pt would have to be the one to make appt with [facility], Pt refused information/contact number on [place].  CM asked pt if she could wait for CM to make appointment at [facility] in [place], pt stated she couldn't not wait, ride at bedside insisting that pt needs to leave immediately.  CM was able to provide pt [facility] brochure and asked pt to contact them today to gain appointment at clinic, CM informed pt that she could get prescriptions filled at clinic once appointment is made.  CM asked pt if she is currently able to afford medications, pt stated that she is currently able to afford medications, pt would not wait for additional medication assistance.       Maryclare Labrador, RN 12/17/2014, 9:14 AM

## 2014-12-17 NOTE — Progress Notes (Addendum)
  [facility] Progress Note  Subjective  - POD #5  Pain improved. Ready to go home.   Objective Filed Vitals:   12/17/14 0422  BP: 144/72  Pulse: 75  Temp: 97.5 F (36.4 C)  Resp: 18    Intake/Output Summary (Last 24 hours) at 12/17/14 0731 Last data filed at 12/17/14 0151  Gross per 24 hour  Intake   1080 ml  Output   2800 ml  Net  -1720 ml    Left leg incisions clean and intact. Post-operative swelling.  Left foot warm and well perfused.  Assessment/Planning: 44 y.o. female is s/p: left common femoral to posterior tibial bypass with non reversed great saphenous vein, arteriogram with left leg runoff 6 Days Post-Op   Pain controlled. Bypass patent.  Had some oozing at distal incision yesterday, likely due to post-operative swelling.  Discharge home today.    Alvia Grove 12/17/2014 7:31 AM --  Laboratory CBC    Component Value Date/Time   WBC 6.4 12/17/2014 0415   HGB 11.1* 12/17/2014 0415   HCT 34.3* 12/17/2014 0415   PLT 260 12/17/2014 0415    BMET    Component Value Date/Time   NA 134* 12/12/2014 0303   K 3.5 12/12/2014 0303   CL 101 12/12/2014 0303   CO2 25 12/12/2014 0303   GLUCOSE 182* 12/12/2014 0303   BUN <5* 12/12/2014 0303   CREATININE 0.45 12/12/2014 0303   CALCIUM 8.3* 12/12/2014 0303   GFRNONAA >60 12/12/2014 0303   GFRAA >60 12/12/2014 0303    COAG Lab Results  Component Value Date   INR 0.89 12/11/2014   INR 0.98 04/22/2014   INR 1.68* 08/20/2013   No results found for: PTT  Antibiotics Anti-infectives    Start     Dose/Rate Route Frequency Ordered Stop   12/12/14 0300  cefUROXime (ZINACEF) 1.5 g in dextrose 5 % 50 mL IVPB     1.5 g 100 mL/hr over 30 Minutes Intravenous Every 12 hours 12/11/14 2036 12/12/14 1540   12/11/14 1446  dextrose 5 % with cefUROXime (ZINACEF) ADS Med    Comments:  Merryl Hacker   : cabinet override      12/11/14 1446 12/11/14 [place], PA-C [facility] Office: [phone] Pager: [phone] 12/17/2014 7:31 AM  Agree with plans for discharge today.  Deitra Mayo, MD, [place] [phone] Office: [phone]

## 2014-12-18 ENCOUNTER — Telehealth: Payer: Self-pay | Admitting: Vascular Surgery

## 2014-12-18 NOTE — Telephone Encounter (Signed)
LM for pt with appt information, dpm °

## 2014-12-18 NOTE — Discharge Summary (Signed)
[facility] Discharge Summary  Toni Logan 09/01/1970 44 y.o. female  [id number]  Admission Date: 12/11/2014  Discharge Date: 12/17/2014  Physician: Tinnie Gens, MD  Admission Diagnosis: PAOD (peripheral arterial occlusive disease) [I77.9]  HPI:   This is a 44 y.o. female who presents for evaluation of circulation left leg. Patient has a long history of ongoing tobacco abuse 1-2 packs per day for 30+ years. She has had multiple procedures in her left leg by Dr. Adele Barthel. She has had previous stenting of her left superficial femoral artery as well as extensive endarterectomy of her left tibial peroneal trunk with patch angioplasty in May 2015. She reports severe claudication over the past week or so and awoke this morning with the left leg having a cold sensation and initially she had tingling in the toes of the left foot which did resolve. She noticed some bluish discoloration of the first toe. She denies rest pain in the left foot. She is not on Coumadin therapy now but she has been in the past. She does have diabetes mellitus type 2 and has had a diagnosis of nonobstructive coronary artery disease in the past.  Hospital Course:  The patient was admitted to the hospital and taken to the [facility] lab on 12/11/2014 and underwent: aortogram with left leg runoff. Operative findings: left leg mid SFA occlusion, popliteal and tibioperoneal trunk occlusion two-vessel runoff anterior tibial and posterior tibial arteries. She was then taken to the operating room that afternoon for left common femoral-posterior tibial artery bypass with saphenous vein graft.   The patient tolerated the procedure well and was transported to the PACU in stable condition.   The patient's post-operative course was uncomplicated. Her main issue was pain management. The patient had some post-operative swelling with minor oozing from distal incision that resolved. Her incisions remained clean and intact.   She had brisk left DP and PT doppler sounds. She was discharged on POD 7 once her pain was adequately controlled on oral pain medications and able to mobilize adequately.     CBC    Component Value Date/Time   WBC 6.4 12/17/2014 0415   RBC 3.87 12/17/2014 0415   HGB 11.1* 12/17/2014 0415   HCT 34.3* 12/17/2014 0415   PLT 260 12/17/2014 0415   MCV 88.6 12/17/2014 0415   MCH 28.7 12/17/2014 0415   MCHC 32.4 12/17/2014 0415   RDW 13.8 12/17/2014 0415   LYMPHSABS 2.5 08/15/2013 1233   MONOABS 0.6 08/15/2013 1233   EOSABS 0.1 08/15/2013 1233   BASOSABS 0.0 08/15/2013 1233    BMET    Component Value Date/Time   NA 134* 12/12/2014 0303   K 3.5 12/12/2014 0303   CL 101 12/12/2014 0303   CO2 25 12/12/2014 0303   GLUCOSE 182* 12/12/2014 0303   BUN <5* 12/12/2014 0303   CREATININE 0.45 12/12/2014 0303   CALCIUM 8.3* 12/12/2014 0303   GFRNONAA >60 12/12/2014 0303   GFRAA >60 12/12/2014 0303     Discharge Instructions:   The patient is discharged to home with extensive instructions on wound care and progressive ambulation.  They are instructed not to drive or perform any heavy lifting until returning to see the physician in his office.  Discharge Instructions    Call MD for:  redness, tenderness, or signs of infection (pain, swelling, bleeding, redness, odor or green/yellow discharge around incision site)    Complete by:  As directed      Call MD for:  severe or increased  pain, loss or decreased feeling  in affected limb(s)    Complete by:  As directed      Call MD for:  temperature >100.5    Complete by:  As directed      Discharge wound care:    Complete by:  As directed   Wash wounds daily with soap and water and pat dry. Keep leg elevated above level of the heart     Driving Restrictions    Complete by:  As directed   No driving for 2 weeks     Increase activity slowly    Complete by:  As directed   Walk with assistance use walker or cane as needed     Lifting  restrictions    Complete by:  As directed   No lifting for 2 weeks     Resume previous diet    Complete by:  As directed            Discharge Diagnosis:  PAOD (peripheral arterial occlusive disease) [I77.9]  Secondary Diagnosis: Patient Active Problem List   Diagnosis Date Noted  . PAD (peripheral artery disease) 12/11/2014  . PAOD (peripheral arterial occlusive disease) 05/08/2014  . Neuropathy 05/08/2014  . Sensation of cold in leg 04/03/2014  . Pain of left lower leg 04/03/2014  . Arterial thromboembolism 12/13/2013  . Acute thromboembolism of deep veins of distal leg 08/31/2013  . Arterial embolism of left leg 08/15/2013  . Essential hypertension, benign 08/15/2013  . Hyperlipidemia 08/15/2013  . Cellulitis of left foot 08/15/2013  . Diabetes mellitus with peripheral vascular disease   . Tobacco abuse    Past Medical History  Diagnosis Date  . Hypertension   . Diabetes mellitus with peripheral vascular disease   . Hypercholesterolemia   . Tobacco abuse   . Coronary artery disease     NON OBSTRUCTIVE       Medication List    STOP taking these medications        clopidogrel 75 MG tablet  Commonly known as:  PLAVIX      TAKE these medications        aspirin EC 81 MG tablet  Take 1 tablet (81 mg total) by mouth daily.     atorvastatin 80 MG tablet  Commonly known as:  LIPITOR  Take 1 tablet (80 mg total) by mouth daily at 6 PM.     gabapentin 300 MG capsule  Commonly known as:  NEURONTIN  Take 600 mg by mouth 3 (three) times daily.     glimepiride 2 MG tablet  Commonly known as:  AMARYL  Take 2 mg by mouth daily with breakfast.     lisinopril 5 MG tablet  Commonly known as:  PRINIVIL,ZESTRIL  Take 1 tablet (5 mg total) by mouth daily.     metFORMIN 500 MG tablet  Commonly known as:  GLUCOPHAGE  Take 1 tablet (500 mg total) by mouth 2 (two) times daily with a meal.     oxyCODONE 5 MG immediate release tablet  Commonly known as:  Oxy  IR/ROXICODONE  Take 2-3 tablets (10-15 mg total) by mouth every 6 (six) hours as needed.        Oxycodone #40 No Refill  Disposition: Home  Patient's condition: is Good  Follow up: 1. Dr. Kellie Simmering in 2 weeks   Virgina Jock, PA-C [facility] [phone] 12/18/2014  11:13 AM  - For VQI Registry use --- Instructions: Press F2 to tab through selections.  Delete  question if not applicable.   Post-op:  Wound infection: No  Graft infection: No  Transfusion: No   New Arrhythmia: No Ipsilateral amputation: No, [ ]  Minor, [ ]  BKA, [ ]  AKA Discharge patency: [x ] Primary, [ ]  Primary assisted, [ ]  Secondary, [ ]  Occluded Patency judged by: [x ] Dopper only, [ ]  Palpable graft pulse, [ ]  Palpable distal pulse, [ ]  ABI inc. > 0.15, [ ]  Duplex Discharge ABI: R 0.91, L 0.91 D/C Ambulatory Status: Ambulatory with Assistance  Complications: MI: No, [ ]  Troponin only, [ ]  EKG or Clinical CHF: No Resp failure:No, [ ]  Pneumonia, [ ]  Ventilator Chg in renal function: No, [ ]  Inc. Cr > 0.5, [ ]  Temp. Dialysis, [ ]  Permanent dialysis Stroke: No, [ ]  Minor, [ ]  Major Return to OR: No  Reason for return to OR: [ ]  Bleeding, [ ]  Infection, [ ]  Thrombosis, [ ]  Revision  Discharge medications: Statin use:  yes ASA use:  yes Plavix use:  no Beta blocker use: no Coumadin use: no

## 2014-12-18 NOTE — Telephone Encounter (Signed)
-----   Message from Mena Goes, RN sent at 12/17/2014 11:59 AM EDT ----- Regarding: Schedule   ----- Message -----    From: Alvia Grove, PA-C    Sent: 12/16/2014   8:57 AM      To: Vvs Charge Pool  S/p left femoropopliteal bypass 12/11/14  F/u with Dr. Kellie Simmering in 2 weeks  Thanks Maudie Mercury

## 2014-12-23 ENCOUNTER — Telehealth: Payer: Self-pay

## 2014-12-23 DIAGNOSIS — G8918 Other acute postprocedural pain: Secondary | ICD-10-CM

## 2014-12-23 MED ORDER — OXYCODONE HCL 5 MG PO TABS: ORAL_TABLET | ORAL | Status: DC

## 2014-12-23 NOTE — Telephone Encounter (Signed)
Phone call from pt.  Reported pain in left lower leg @ "8/10"; described the pain as "significant".  Stated the incisions are intact.  Denied any drainage.  Denied fever/chills.  Reported "some swelling" the incisions, but denied generalized swelling in  left lower extremity.  Stated she is trying to walk more, and c/o "crampy and tight feeling" in left calf when she walks.   Is requesting a refill on her pain medication.  Will discuss with Dr. Trula Slade, MD in office today.

## 2014-12-23 NOTE — Telephone Encounter (Signed)
Discussed pt's. symptoms with Dr. Trula Slade.  Rec'd. verbal order for Oxycodone 5 mg. IR tab; take 1-2 tablets q 6 hrs/ prn/ pain; qty 40; no refills.  Pt. notified of Rx to be avail. at front desk.

## 2014-12-24 ENCOUNTER — Encounter: Payer: Self-pay | Admitting: Vascular Surgery

## 2014-12-24 NOTE — Progress Notes (Signed)
Received faxed letter from Marcine Matar  of  [employer] 12-23-14,  Pt. Toni Logan declined service on 12-20-14  due to co pay.

## 2014-12-30 ENCOUNTER — Encounter: Payer: Self-pay | Admitting: Vascular Surgery

## 2014-12-31 ENCOUNTER — Encounter: Payer: Self-pay | Admitting: Vascular Surgery

## 2014-12-31 ENCOUNTER — Ambulatory Visit (INDEPENDENT_AMBULATORY_CARE_PROVIDER_SITE_OTHER): Payer: Self-pay | Admitting: Vascular Surgery

## 2014-12-31 VITALS — BP 133/63 | HR 85 | Temp 97.9°F | Resp 18 | Ht 67.0 in | Wt 190.0 lb

## 2014-12-31 DIAGNOSIS — Z48812 Encounter for surgical aftercare following surgery on the circulatory system: Secondary | ICD-10-CM

## 2014-12-31 DIAGNOSIS — I739 Peripheral vascular disease, unspecified: Secondary | ICD-10-CM

## 2014-12-31 NOTE — Addendum Note (Signed)
Addended by: Mena Goes on: 12/31/2014 05:10 PM   Modules accepted: Orders

## 2014-12-31 NOTE — Progress Notes (Signed)
Subjective:     Patient ID: Toni Logan, female   DOB: [birth year], 44 y.o.   MRN: [id number]  HPI This 44 year old female returns 3 weeks post left  Common femoral to posterior tibial bypass using saphenous vein graft from left leg for severely ischemic left leg. Patient has been having discomfort in the distal calf incision which had some blistering over the lower portion of the wound. She states the foot does feel better but she continues to have some burning discomfort in the leg. She states she has only had 2 cigarettes since discharge from the hospital sheet. She is out of her pain medication.     Review of Systems     Objective:   Physical Exam BP 133/63 mmHg  Pulse 85  Temp(Src) 97.9 F (36.6 C)  Resp 18  Ht 5\' 7"  (1.702 m)  Wt 190 lb (86.183 kg)  BMI 29.75 kg/m2  SpO2 99%  LMP 12/10/2014   Gen. Well-developed well-nourished female in no apparent chest alert and oriented 3  left leg with 3+ graft pulse in subcutaneous position at the knee and 2-3+ posterior tibial pulse palpable. Left foot well perfused. Some minimal erythema surrounding the distal 3-4 cm of the wound. The inguinal wound has essentially healed except for the distal 1 cm. It is somewhat moist and needs dry gauze.     Assessment:      nicely functioning left femoral to posterior tibial saphenous vein graft for limb salvage   minimal erythema and distal aspect of calf wound    Plan:      Keflex 5 and her milligrams by mouth 3 times a day for 10 days Given prescription for oxycodone No. 40 5 mg tablets   return in 1 month with duplex scan of bypass graft and check ABIs  Instructed to keep calf wound covered and 2 keep inguinal wound dry

## 2015-01-02 ENCOUNTER — Telehealth: Payer: Self-pay | Admitting: *Deleted

## 2015-01-02 DIAGNOSIS — T8131XD Disruption of external operation (surgical) wound, not elsewhere classified, subsequent encounter: Secondary | ICD-10-CM

## 2015-01-02 MED ORDER — CIPROFLOXACIN HCL 500 MG PO TABS: 500.0000 mg | ORAL_TABLET | Freq: Two times a day (BID) | ORAL | Status: DC

## 2015-01-02 NOTE — Telephone Encounter (Signed)
Patient called triage to report that her Keflex makes her extremely nauseous even when taking it with food. She requests an alternate antibiotic. Cipro 500mg  approved by Dr. Oneida Alar. I instructed the patient to watch her blood sugars while on this medication; she voiced approval and understanding of this plan. She will keep her regularly scheduled appt with Dr. Kellie Simmering.

## 2015-01-03 ENCOUNTER — Ambulatory Visit: Payer: Self-pay | Admitting: Vascular Surgery

## 2015-01-03 ENCOUNTER — Encounter (HOSPITAL_COMMUNITY): Payer: Self-pay

## 2015-01-07 ENCOUNTER — Telehealth: Payer: Self-pay

## 2015-01-07 NOTE — Telephone Encounter (Signed)
Spoke with pt- aware that she has to see NP before we can change antibiotic. Patient agreed on appt for 01/08/15 @ 11 dpm

## 2015-01-07 NOTE — Telephone Encounter (Signed)
Rec'd. phone call from pt.  Stated she recently saw her PCP, and was advised to call the office, and request a change in antibiotic to a "Sulfa drug."  Pt. stated she has had increased incisional pain, and noted the open area of the left lower leg incision, to have increased in size.  Reported a "reddish-brown drainage."  Denied any fever or chills.   Discussed with Dr. Kellie Simmering.  Recommended to schedule appt. for pt. to be evaluated by the nurse practitioner.  Pt. Will be notified of recommendation and appt.

## 2015-01-08 ENCOUNTER — Ambulatory Visit (INDEPENDENT_AMBULATORY_CARE_PROVIDER_SITE_OTHER): Payer: Self-pay | Admitting: Family

## 2015-01-08 ENCOUNTER — Encounter: Payer: Self-pay | Admitting: Family

## 2015-01-08 VITALS — BP 142/71 | HR 97 | Temp 97.9°F | Ht 67.0 in | Wt 195.9 lb

## 2015-01-08 DIAGNOSIS — T8131XD Disruption of external operation (surgical) wound, not elsewhere classified, subsequent encounter: Secondary | ICD-10-CM

## 2015-01-08 DIAGNOSIS — Z9889 Other specified postprocedural states: Secondary | ICD-10-CM

## 2015-01-08 DIAGNOSIS — I779 Disorder of arteries and arterioles, unspecified: Secondary | ICD-10-CM

## 2015-01-08 DIAGNOSIS — F17200 Nicotine dependence, unspecified, uncomplicated: Secondary | ICD-10-CM

## 2015-01-08 DIAGNOSIS — Z95828 Presence of other vascular implants and grafts: Secondary | ICD-10-CM

## 2015-01-08 DIAGNOSIS — Z720 Tobacco use: Secondary | ICD-10-CM

## 2015-01-08 NOTE — Progress Notes (Signed)
Postoperative Visit   History of Present Illness  Toni Logan is a 44 y.o. year old female patient of Dr. Kellie Simmering whom he last saw on 12/31/14. At that time pt was 3 weeks post leftcommon femoral to posterior tibial bypass using saphenous vein graft from left leg (12/11/14) for severely ischemic left leg. Patient has been having discomfort in the distal calf incision which had some blistering over the lower portion of the wound. She stated the foot did feel better but she continued to have some burning discomfort in the leg. The graft was functioning well. Dr. Kellie Simmering prescribed Keflex for 10 days, but pt developed severe vomiting when taking Keflex and Dr. Kellie Simmering changed her antibiotic to Cipro.  At that visit Dr. Kellie Simmering also gave pt a prescription for 5 mg oxycodone tablets, disp #40 with no refills, since pt ran out of her analgesic and was having considerable post operative pain in her left leg. Dr. Kellie Simmering instructed the pt to keep the calf wound covered and to keep the inguinal wound dry.  Pt returns today at the request of her PCP whom pt saw yesterday, for evaluation of left leg swelling, erythema, and consideration of antibx change from Cipro.  Pt states she has been walking, and when not walking she states she elevates her legs in a recliner.  She is still smoking about 1/2 ppd. Pt states her DM is doing well, states her home glucose readings ac and pc are 80-120 which is a big improvement in her glycemic control.   Pt denies fever or chills; states drainage form the dehisced parts of the left leg incision is thin, clear,  red blood tinged.  The patient is able to complete their activities of daily living.    For VQI Use Only  PRE-ADM LIVING: Home  AMB STATUS: Ambulatory  Physical Examination  Filed Vitals:   01/08/15 1410 01/08/15 1412  BP: 150/80 142/71  Pulse: 97   Temp: 97.9 F (36.6 C)   TempSrc: Oral   Height: 5\' 7"  (1.702 m)   Weight: 195 lb 14.4 oz (88.86 kg)     SpO2: 97%    Body mass index is 30.68 kg/(m^2).   Left lower leg is moderately swollen, medial incision is separated for about 6 inches of the lower leg part of the incision; erythema surrounds the lower leg part of the incision. There is also a small opening of the incision at the level of the left knee which is also erythematous and painful. Lower leg open incision is draining a small amount of serous fluid. Left PT pulse is 2+ palpable and DP is faintly palpable.  There are no signs of ischemia in her left foot or leg.    Medical Decision Making  Toni Logan is a 44 y.o. year old female who presents s/p leftcommon femoral to posterior tibial bypass using saphenous vein graft from left leg (12/11/14) for severely ischemic left leg.  Pt has a history of uncontrolled DM and smoking. Her DM seems in control now but she continues to smoke.  The patient was counseled re smoking cessation and given several free resources re smoking cessation.  Left leg is edematous and the edema has facilitated separation of the incision in the left lower leg. The left groin and left medial thigh incisions are healing well. It appears that the patient is not elevating her legs above her heart when she sits in her recliner, which facilitates edema in the operative left leg. Pt  instructed in the proper elevation of foot/leg above her heart, at least 20 minutes/day, at least 4x/day, to decrease swelling and allow for granulation of incision. Dr. Scot Dock spoke with and examined pt. Continue and finish the Cipro as prescribed. NS wet to dry dressings daily to the open incision of left leg, secure with ace wrap, no tape to skin. Follow up in 2 weeks with Dr. Kellie Simmering for wound check. Pt knows to let us know if her left leg swelling and erythema does not improve.  I discussed in depth with the patient the nature of atherosclerosis, and emphasized the importance of maximal medical management including strict control of  blood pressure, blood glucose, and lipid levels, obtaining regular exercise, and cessation of smoking.  The patient is aware that without maximal medical management the underlying atherosclerotic disease process will progress, limiting the benefit of any interventions.   I emphasized the importance of routine surveillance of the patient's bypass, as the vascular surgery literature emphasize the improved patency possible with assisted primary patency procedures versus secondary patency procedures. The patient agrees to participate in their maximal medical care and routine surveillance.  Thank you for allowing [place] to participate in this patient's care.  Katie Faraone, Sharmon Leyden, RN, MSN, FNP-C [facility] Office: [phone]  01/08/2015, 2:12 PM  Clinic MD: Scot Dock

## 2015-01-08 NOTE — Patient Instructions (Signed)
Peripheral Vascular Disease Peripheral Vascular Disease (PVD), also called Peripheral Arterial Disease (PAD), is a circulation problem caused by cholesterol (atherosclerotic plaque) deposits in the arteries. PVD commonly occurs in the lower extremities (legs) but it can occur in other areas of the body, such as your arms. The cholesterol buildup in the arteries reduces blood flow which can cause pain and other serious problems. The presence of PVD can place a person at risk for Coronary Artery Disease (CAD).  CAUSES  Causes of PVD can be many. It is usually associated with more than one risk factor such as:   High Cholesterol.  Smoking.  Diabetes.  Lack of exercise or inactivity.  High blood pressure (hypertension).  Obesity.  Family history. SYMPTOMS   When the lower extremities are affected, patients with PVD may experience:  Leg pain with exertion or physical activity. This is called INTERMITTENT CLAUDICATION. This may present as cramping or numbness with physical activity. The location of the pain is associated with the level of blockage. For example, blockage at the abdominal level (distal abdominal aorta) may result in buttock or hip pain. Lower leg arterial blockage may result in calf pain.  As PVD becomes more severe, pain can develop with less physical activity.  In people with severe PVD, leg pain may occur at rest.  Other PVD signs and symptoms:  Leg numbness or weakness.  Coldness in the affected leg or foot, especially when compared to the other leg.  A change in leg color.  Patients with significant PVD are more prone to ulcers or sores on toes, feet or legs. These may take longer to heal or may reoccur. The ulcers or sores can become infected.  If signs and symptoms of PVD are ignored, gangrene may occur. This can result in the loss of toes or loss of an entire limb.  Not all leg pain is related to PVD. Other medical conditions can cause leg pain such  as:  Blood clots (embolism) or Deep Vein Thrombosis.  Inflammation of the blood vessels (vasculitis).  Spinal stenosis. DIAGNOSIS  Diagnosis of PVD can involve several different types of tests. These can include:  Pulse Volume Recording Method (PVR). This test is simple, painless and does not involve the use of X-rays. PVR involves measuring and comparing the blood pressure in the arms and legs. An ABI (Ankle-Brachial Index) is calculated. The normal ratio of blood pressures is 1. As this number becomes smaller, it indicates more severe disease.  < 0.95 - indicates significant narrowing in one or more leg vessels.  <0.8 - there will usually be pain in the foot, leg or buttock with exercise.  <0.4 - will usually have pain in the legs at rest.  <0.25 - usually indicates limb threatening PVD.  Doppler detection of pulses in the legs. This test is painless and checks to see if you have a pulses in your legs/feet.  A dye or contrast material (a substance that highlights the blood vessels so they show up on x-ray) may be given to help your caregiver better see the arteries for the following tests. The dye is eliminated from your body by the kidney's. Your caregiver may order blood work to check your kidney function and other laboratory values before the following tests are performed:  Magnetic Resonance Angiography (MRA). An MRA is a picture study of the blood vessels and arteries. The MRA machine uses a large magnet to produce images of the blood vessels.  Computed Tomography Angiography (CTA). A CTA   is a specialized x-ray that looks at how the blood flows in your blood vessels. An IV may be inserted into your arm so contrast dye can be injected.  Angiogram. Is a procedure that uses x-rays to look at your blood vessels. This procedure is minimally invasive, meaning a small incision (cut) is made in your groin. A small tube (catheter) is then inserted into the artery of your groin. The catheter  is guided to the blood vessel or artery your caregiver wants to examine. Contrast dye is injected into the catheter. X-rays are then taken of the blood vessel or artery. After the images are obtained, the catheter is taken out. TREATMENT  Treatment of PVD involves many interventions which may include:  Lifestyle changes:  Quitting smoking.  Exercise.  Following a low fat, low cholesterol diet.  Control of diabetes.  Foot care is very important to the PVD patient. Good foot care can help prevent infection.  Medication:  Cholesterol-lowering medicine.  Blood pressure medicine.  Anti-platelet drugs.  Certain medicines may reduce symptoms of Intermittent Claudication.  Interventional/Surgical options:  Angioplasty. An Angioplasty is a procedure that inflates a balloon in the blocked artery. This opens the blocked artery to improve blood flow.  Stent Implant. A wire mesh tube (stent) is placed in the artery. The stent expands and stays in place, allowing the artery to remain open.  Peripheral Bypass Surgery. This is a surgical procedure that reroutes the blood around a blocked artery to help improve blood flow. This type of procedure may be performed if Angioplasty or stent implants are not an option. SEEK IMMEDIATE MEDICAL CARE IF:   You develop pain or numbness in your arms or legs.  Your arm or leg turns cold, becomes blue in color.  You develop redness, warmth, swelling and pain in your arms or legs. MAKE SURE YOU:   Understand these instructions.  Will watch your condition.  Will get help right away if you are not doing well or get worse. Document Released: 05/06/2004 Document Revised: 06/21/2011 Document Reviewed: 04/02/2008 Exit[employer] Patient Information 2015 [employer]. This information is not intended to replace advice given to you by your health care Leianne Callins. Make sure you discuss any questions you have with your health care Keiasha Diep.    Smoking  Cessation Quitting smoking is important to your health and has many advantages. However, it is not always easy to quit since nicotine is a very addictive drug. Oftentimes, people try 3 times or more before being able to quit. This document explains the best ways for you to prepare to quit smoking. Quitting takes hard work and a lot of effort, but you can do it. ADVANTAGES OF QUITTING SMOKING  You will live longer, feel better, and live better.  Your body will feel the impact of quitting smoking almost immediately.  Within 20 minutes, blood pressure decreases. Your pulse returns to its normal level.  After 8 hours, carbon monoxide levels in the blood return to normal. Your oxygen level increases.  After 24 hours, the chance of having a heart attack starts to decrease. Your breath, hair, and body stop smelling like smoke.  After 48 hours, damaged nerve endings begin to recover. Your sense of taste and smell improve.  After 72 hours, the body is virtually free of nicotine. Your bronchial tubes relax and breathing becomes easier.  After 2 to 12 weeks, lungs can hold more air. Exercise becomes easier and circulation improves.  The risk of having a heart attack, stroke,   cancer, or lung disease is greatly reduced.  After 1 year, the risk of coronary heart disease is cut in half.  After 5 years, the risk of stroke falls to the same as a nonsmoker.  After 10 years, the risk of lung cancer is cut in half and the risk of other cancers decreases significantly.  After 15 years, the risk of coronary heart disease drops, usually to the level of a nonsmoker.  If you are pregnant, quitting smoking will improve your chances of having a healthy baby.  The people you live with, especially any children, will be healthier.  You will have extra money to spend on things other than cigarettes. QUESTIONS TO THINK ABOUT BEFORE ATTEMPTING TO QUIT You may want to talk about your answers with your health care  Yaniah Thiemann.  Why do you want to quit?  If you tried to quit in the past, what helped and what did not?  What will be the most difficult situations for you after you quit? How will you plan to handle them?  Who can help you through the tough times? Your family? Friends? A health care Del Overfelt?  What pleasures do you get from smoking? What ways can you still get pleasure if you quit? Here are some questions to ask your health care Kashlynn Kundert:  How can you help me to be successful at quitting?  What medicine do you think would be best for me and how should I take it?  What should I do if I need more help?  What is smoking withdrawal like? How can I get information on withdrawal? GET READY  Set a quit date.  Change your environment by getting rid of all cigarettes, ashtrays, matches, and lighters in your home, car, or work. Do not let people smoke in your home.  Review your past attempts to quit. Think about what worked and what did not. GET SUPPORT AND ENCOURAGEMENT You have a better chance of being successful if you have help. You can get support in many ways.  Tell your family, friends, and coworkers that you are going to quit and need their support. Ask them not to smoke around you.  Get individual, group, or telephone counseling and support. Programs are available at local hospitals and health centers. Call your local health department for information about programs in your area.  Spiritual beliefs and practices may help some smokers quit.  Download a "quit meter" on your computer to keep track of quit statistics, such as how long you have gone without smoking, cigarettes not smoked, and money saved.  Get a self-help book about quitting smoking and staying off tobacco. LEARN NEW SKILLS AND BEHAVIORS  Distract yourself from urges to smoke. Talk to someone, go for a walk, or occupy your time with a task.  Change your normal routine. Take a different route to work. Drink tea  instead of coffee. Eat breakfast in a different place.  Reduce your stress. Take a hot bath, exercise, or read a book.  Plan something enjoyable to do every day. Reward yourself for not smoking.  Explore interactive web-based programs that specialize in helping you quit. GET MEDICINE AND USE IT CORRECTLY Medicines can help you stop smoking and decrease the urge to smoke. Combining medicine with the above behavioral methods and support can greatly increase your chances of successfully quitting smoking.  Nicotine replacement therapy helps deliver nicotine to your body without the negative effects and risks of smoking. Nicotine replacement therapy includes nicotine gum, lozenges,   inhalers, nasal sprays, and skin patches. Some may be available over-the-counter and others require a prescription.  Antidepressant medicine helps people abstain from smoking, but how this works is unknown. This medicine is available by prescription.  Nicotinic receptor partial agonist medicine simulates the effect of nicotine in your brain. This medicine is available by prescription. Ask your health care Amerie Beaumont for advice about which medicines to use and how to use them based on your health history. Your health care Lavender Stanke will tell you what side effects to look out for if you choose to be on a medicine or therapy. Carefully read the information on the package. Do not use any other product containing nicotine while using a nicotine replacement product.  RELAPSE OR DIFFICULT SITUATIONS Most relapses occur within the first 3 months after quitting. Do not be discouraged if you start smoking again. Remember, most people try several times before finally quitting. You may have symptoms of withdrawal because your body is used to nicotine. You may crave cigarettes, be irritable, feel very hungry, cough often, get headaches, or have difficulty concentrating. The withdrawal symptoms are only temporary. They are strongest when you  first quit, but they will go away within 10-14 days. To reduce the chances of relapse, try to:  Avoid drinking alcohol. Drinking lowers your chances of successfully quitting.  Reduce the amount of caffeine you consume. Once you quit smoking, the amount of caffeine in your body increases and can give you symptoms, such as a rapid heartbeat, sweating, and anxiety.  Avoid smokers because they can make you want to smoke.  Do not let weight gain distract you. Many smokers will gain weight when they quit, usually less than 10 pounds. Eat a healthy diet and stay active. You can always lose the weight gained after you quit.  Find ways to improve your mood other than smoking. FOR MORE INFORMATION  www.smokefree.gov  Document Released: 03/23/2001 Document Revised: 08/13/2013 Document Reviewed: 07/08/2011 [employer] Patient Information 2015 [employer]. This information is not intended to replace advice given to you by your health care Shanah Guimaraes. Make sure you discuss any questions you have with your health care Elon Eoff.    Smoking Cessation, Tips for Success If you are ready to quit smoking, congratulations! You have chosen to help yourself be healthier. Cigarettes bring nicotine, tar, carbon monoxide, and other irritants into your body. Your lungs, heart, and blood vessels will be able to work better without these poisons. There are many different ways to quit smoking. Nicotine gum, nicotine patches, a nicotine inhaler, or nicotine nasal spray can help with physical craving. Hypnosis, support groups, and medicines help break the habit of smoking. WHAT THINGS CAN I DO TO MAKE QUITTING EASIER?  Here are some tips to help you quit for good:  Pick a date when you will quit smoking completely. Tell all of your friends and family about your plan to quit on that date.  Do not try to slowly cut down on the number of cigarettes you are smoking. Pick a quit date and quit smoking completely starting on that  day.  Throw away all cigarettes.   Clean and remove all ashtrays from your home, work, and car.  On a card, write down your reasons for quitting. Carry the card with you and read it when you get the urge to smoke.  Cleanse your body of nicotine. Drink enough water and fluids to keep your urine clear or pale yellow. Do this after quitting to flush the nicotine from   your body.  Learn to predict your moods. Do not let a bad situation be your excuse to have a cigarette. Some situations in your life might tempt you into wanting a cigarette.  Never have "just one" cigarette. It leads to wanting another and another. Remind yourself of your decision to quit.  Change habits associated with smoking. If you smoked while driving or when feeling stressed, try other activities to replace smoking. Stand up when drinking your coffee. Brush your teeth after eating. Sit in a different chair when you read the paper. Avoid alcohol while trying to quit, and try to drink fewer caffeinated beverages. Alcohol and caffeine may urge you to smoke.  Avoid foods and drinks that can trigger a desire to smoke, such as sugary or spicy foods and alcohol.  Ask people who smoke not to smoke around you.  Have something planned to do right after eating or having a cup of coffee. For example, plan to take a walk or exercise.  Try a relaxation exercise to calm you down and decrease your stress. Remember, you may be tense and nervous for the first 2 weeks after you quit, but this will pass.  Find new activities to keep your hands busy. Play with a pen, coin, or rubber band. Doodle or draw things on paper.  Brush your teeth right after eating. This will help cut down on the craving for the taste of tobacco after meals. You can also try mouthwash.   Use oral substitutes in place of cigarettes. Try using lemon drops, carrots, cinnamon sticks, or chewing gum. Keep them handy so they are available when you have the urge to  smoke.  When you have the urge to smoke, try deep breathing.  Designate your home as a nonsmoking area.  If you are a heavy smoker, ask your health care Lavell Supple about a prescription for nicotine chewing gum. It can ease your withdrawal from nicotine.  Reward yourself. Set aside the cigarette money you save and buy yourself something nice.  Look for support from others. Join a support group or smoking cessation program. Ask someone at home or at work to help you with your plan to quit smoking.  Always ask yourself, "Do I need this cigarette or is this just a reflex?" Tell yourself, "Today, I choose not to smoke," or "I do not want to smoke." You are reminding yourself of your decision to quit.  Do not replace cigarette smoking with electronic cigarettes (commonly called e-cigarettes). The safety of e-cigarettes is unknown, and some may contain harmful chemicals.  If you relapse, do not give up! Plan ahead and think about what you will do the next time you get the urge to smoke. HOW WILL I FEEL WHEN I QUIT SMOKING? You may have symptoms of withdrawal because your body is used to nicotine (the addictive substance in cigarettes). You may crave cigarettes, be irritable, feel very hungry, cough often, get headaches, or have difficulty concentrating. The withdrawal symptoms are only temporary. They are strongest when you first quit but will go away within 10-14 days. When withdrawal symptoms occur, stay in control. Think about your reasons for quitting. Remind yourself that these are signs that your body is healing and getting used to being without cigarettes. Remember that withdrawal symptoms are easier to treat than the major diseases that smoking can cause.  Even after the withdrawal is over, expect periodic urges to smoke. However, these cravings are generally short lived and will go away whether you   smoke or not. Do not smoke! WHAT RESOURCES ARE AVAILABLE TO HELP ME QUIT SMOKING? Your health care  Whitman Meinhardt can direct you to community resources or hospitals for support, which may include:  Group support.  Education.  Hypnosis.  Therapy. Document Released: 12/26/2003 Document Revised: 08/13/2013 Document Reviewed: 09/14/2012 Exit[employer] Patient Information 2015 [employer]. This information is not intended to replace advice given to you by your health care Dorothey Oetken. Make sure you discuss any questions you have with your health care Mellina Benison.  

## 2015-01-15 ENCOUNTER — Telehealth: Payer: Self-pay

## 2015-01-15 NOTE — Telephone Encounter (Signed)
Pt. called to report the left lower leg incision is not improving, and actually looks worse. Stated the incision in the calf area has opened-up further, and "is stinging constantly."  Reported redness in the peri-wound, and clear, blood-tinged drainage from the wound.  Denied fever/ chills.  Stated she is finished with the antibiotics.  Stated she has swelling from left foot to just below knee.  Will schedule appt. for wound check.

## 2015-01-16 ENCOUNTER — Ambulatory Visit (INDEPENDENT_AMBULATORY_CARE_PROVIDER_SITE_OTHER): Payer: Self-pay | Admitting: Family

## 2015-01-16 ENCOUNTER — Encounter: Payer: Self-pay | Admitting: Family

## 2015-01-16 VITALS — BP 137/63 | HR 76 | Temp 97.5°F | Ht 67.0 in | Wt 196.0 lb

## 2015-01-16 DIAGNOSIS — Z95828 Presence of other vascular implants and grafts: Secondary | ICD-10-CM

## 2015-01-16 DIAGNOSIS — I779 Disorder of arteries and arterioles, unspecified: Secondary | ICD-10-CM

## 2015-01-16 DIAGNOSIS — Z720 Tobacco use: Secondary | ICD-10-CM

## 2015-01-16 DIAGNOSIS — F17200 Nicotine dependence, unspecified, uncomplicated: Secondary | ICD-10-CM

## 2015-01-16 DIAGNOSIS — T8131XD Disruption of external operation (surgical) wound, not elsewhere classified, subsequent encounter: Secondary | ICD-10-CM

## 2015-01-16 NOTE — Progress Notes (Signed)
Postoperative Visit   History of Present Illness  Toni Logan is a 44 y.o. year old female patient of Dr. Kellie Simmering whom he last saw on 12/31/14. At that time pt was 3 weeks post leftcommon femoral to posterior tibial bypass using saphenous vein graft from left leg (12/11/14) for severely ischemic left leg. Patient has been having discomfort in the distal calf incision which had some blistering over the lower portion of the wound. She stated the foot did feel better but she continued to have some burning discomfort in the leg. The graft was functioning well. Dr. Kellie Simmering prescribed Keflex for 10 days, but pt developed severe vomiting when taking Keflex and Dr. Kellie Simmering changed her antibiotic to Cipro.  At that visit Dr. Kellie Simmering also gave pt a prescription for 5 mg oxycodone tablets, disp #40 with no refills, since pt ran out of her analgesic and was having considerable post operative pain in her left leg. Dr. Kellie Simmering instructed the pt to keep the calf wound covered and to keep the inguinal wound dry.  I last saw pt on 01/08/15, a week ago. At that time her left leg was edematous and the edema has facilitated separation of the incision in the left lower leg. The left groin and left medial thigh incisions were healing well. It appeared that the patient was not elevating her legs above her heart when she sits in her recliner, which facilitates edema in the operative left leg. Pt was instructed in the proper elevation of foot/leg above her heart, at least 20 minutes/day, at least 4x/day, to decrease swelling and allow for granulation of incision. Dr. Scot Dock spoke with and examined pt. Continue and finish the Cipro as prescribed. NS wet to dry dressings daily to the open incision of left leg, secure with ace wrap, no tape to skin. Pt was then advised to follow up in 2 weeks with Dr. Kellie Simmering for wound check and to let us know if her left leg swelling and erythema does not improve.  Pt returns again today  with c/o left calf incision opened up more, no increase of pain. She reports a moderate amount of clear watery drainage from the left lower leg incision.  Pt states she has been walking, and when not walking she states she elevates her legs in a recliner above her heart. She is still smoking about 1/2 ppd. Pt states her DM is doing well, states her home glucose readings ac and pc are 80-120 which is a big improvement in her glycemic control.   Pt denies fever or chills; states drainage form the open parts of the left leg incision is thin, clear, red blood tinged.  The patient is able to complete their activities of daily living. Pt denies chest pain, denies feeling light-headed, denies shortness of breath.    For VQI Use Only  PRE-ADM LIVING: Home  AMB STATUS: Ambulatory  Physical Examination  Filed Vitals:   01/16/15 1347 01/16/15 1350  BP: 161/66 137/63  Pulse: 76   Temp: 97.5 F (36.4 C)   TempSrc: Oral   Height: 5\' 7"  (1.702 m)   Weight: 196 lb (88.905 kg)   SpO2: 98%    Body mass index is 30.69 kg/(m^2).  Left groin and leg: open portions of incisions are granulating adequately,left pedal pulses are audible by Doppler: PT, DP, and peroneal. She has had resolution of her preoperative left foot pain. Her incision pain is adequately controlled.  Medical Decision Making  [name] is a  44 y.o. year old female who is s/p leftcommon femoral to posterior tibial bypass using saphenous vein graft from left leg (12/11/14) for severely ischemic left leg.  Pt has a history of uncontrolled DM and smoking. Her DM seems in control now but she continues to smoke.  The patient was counseled re smoking cessation and given several free resources re smoking cessation.  Her cardiac rhythm is regularly irregular in a pattern of missed 4th contraction. She is asymptomatic re this and denies any known history of cardiac arrhythmia. I advised pt to follow up with her PCP re this.     Left leg is less edematous than last week when she came in for evaluation of left leg edema and separation of incision at medial aspect left leg. She apparently has been elevating her legs adequately to facilitate reduction of left leg edema post operatively. The left groin and left medial thigh incisions are healing well. She finished the Cipro. She continues the daily soap and water cleansing of her left groin and leg incisions, and packing the left leg open incision with wet to dry dressings.  The incisions are oozing the excess tissue edema from her leg; incision beds are granulating well with no signs of infection. Continue to walk several times/day and elevate legs properly as discussed when not walking. Continue smoking cessation efforts. I discussed in depth with the patient the nature of atherosclerosis, and emphasized the importance of maximal medical management including strict control of blood pressure, blood glucose, and lipid levels, obtaining regular exercise, and cessation of smoking.  The patient is aware that without maximal medical management the underlying atherosclerotic disease process will progress, limiting the benefit of any interventions. Follow up as scheduled on 01/28/15 with Dr. Kellie Simmering.  I emphasized the importance of routine surveillance of the patient's bypass, as the vascular surgery literature emphasize the improved patency possible with assisted primary patency procedures versus secondary patency procedures. The patient agrees to participate in their maximal medical care and routine surveillance.  Thank you for allowing [place] to participate in this patient's care.  Keelen Quevedo, Sharmon Leyden, RN, MSN, FNP-C [facility] Office: [phone]  01/16/2015, 1:40 PM  Clinic MD: Oneida Alar

## 2015-01-23 ENCOUNTER — Encounter: Payer: Self-pay | Admitting: Vascular Surgery

## 2015-01-28 ENCOUNTER — Ambulatory Visit (INDEPENDENT_AMBULATORY_CARE_PROVIDER_SITE_OTHER): Payer: Self-pay | Admitting: Vascular Surgery

## 2015-01-28 ENCOUNTER — Encounter: Payer: Self-pay | Admitting: Vascular Surgery

## 2015-01-28 VITALS — BP 146/82 | HR 100 | Temp 97.8°F | Resp 18 | Ht 67.5 in | Wt 188.0 lb

## 2015-01-28 DIAGNOSIS — I739 Peripheral vascular disease, unspecified: Secondary | ICD-10-CM

## 2015-01-28 NOTE — Progress Notes (Signed)
Filed Vitals:   01/28/15 0840 01/28/15 0844  BP: 170/88 146/82  Pulse: 109 100  Temp: 97.8 F (36.6 C)   Resp: 18   Height: 5' 7.5" (1.715 m)   Weight: 188 lb (85.276 kg)   SpO2: 100%

## 2015-01-28 NOTE — Progress Notes (Signed)
Subjective:     Patient ID: Toni Logan, female   DOB: Oct 16, 1970, 44 y.o.   MRN: [id number]  HPI this 44 year old female returns for continued follow-up regarding her slowly healing wound in the left leg following left femoral to posterior tibial saphenous vein graft performed August 31. She continues to have a nicely functioning vein graft with a pulse in the graft and in the ankle but very slow healing of the distal surgical wound in the proximal calf area. She has been on variety of antibiotics including Keflex which she did not tolerate and Cipro. She is doing dressing changes twice daily moist to dry. She continues to smoke at least one half pack of cigarettes per day. She is taking oxycodone for pain.   Review of Systems     Objective:   Physical Exam BP 146/82 mmHg  Pulse 100  Temp(Src) 97.8 F (36.6 C)  Resp 18  Ht 5' 7.5" (1.715 m)  Wt 188 lb (85.276 kg)  BMI 28.99 kg/m2  SpO2 100%  General chronically ill-appearing female no apparent distress alert and oriented 3 Left leg with 3+ pulse in subcutaneous position and 3+ posterior tibial pulse palpable left foot. Distal wound and calf has 3 x 2 cm area in the distal aspect which continues to have some necrotic debris in the base which was debrided today. The very proximal portion of this wound has a 0.5 cm slowly healing area as well. There is no purulent drainage.     Assessment:     Slowly healing calf wounds due to severe microvascular occlusive disease despite functioning femoral to posterior tibial saphenous vein graft and viable foot This does not appear infected and we'll discontinue antibiotics at this time Once again it was emphasized to the patient the fact that tobacco cessation is a modest if she is to have any success going forward     Plan:     #1 continue moist to dry dressings twice daily left leg Number to elevate foot of bed 4 inches #3 spent many hours each day with leg elevated significantly to decrease  edema and calf #4 return in 6 weeks with follow-up to see nurse practitioner DC appointment to see me November 8 She was given prescription for oxycodone 5 mg #40 tablets one 6-8 hours when necessary for pain and also was emphasized to her that we cannot give her enough pain medicine to relieve her pain and at this wound will require several months to heal.

## 2015-02-18 ENCOUNTER — Other Ambulatory Visit (HOSPITAL_COMMUNITY): Payer: Self-pay

## 2015-02-18 ENCOUNTER — Ambulatory Visit: Payer: Self-pay | Admitting: Vascular Surgery

## 2015-02-18 ENCOUNTER — Encounter (HOSPITAL_COMMUNITY): Payer: Self-pay

## 2015-03-11 ENCOUNTER — Encounter: Payer: Self-pay | Admitting: Family

## 2015-03-14 ENCOUNTER — Ambulatory Visit: Payer: Self-pay | Admitting: Family

## 2015-08-08 ENCOUNTER — Encounter (HOSPITAL_COMMUNITY): Payer: Self-pay

## 2015-08-08 ENCOUNTER — Emergency Department (HOSPITAL_BASED_OUTPATIENT_CLINIC_OR_DEPARTMENT_OTHER): Admit: 2015-08-08 | Discharge: 2015-08-08 | Disposition: A | Discharge status: Home / Self Care | Payer: Medicaid Other

## 2015-08-08 ENCOUNTER — Emergency Department (HOSPITAL_COMMUNITY)
Admission: EM | Admit: 2015-08-08 | Discharge: 2015-08-08 | Disposition: A | Discharge status: Home / Self Care | Payer: Self-pay | Attending: Emergency Medicine | Admitting: Emergency Medicine

## 2015-08-08 DIAGNOSIS — I2510 Atherosclerotic heart disease of native coronary artery without angina pectoris: Secondary | ICD-10-CM | POA: Insufficient documentation

## 2015-08-08 DIAGNOSIS — F17210 Nicotine dependence, cigarettes, uncomplicated: Secondary | ICD-10-CM | POA: Insufficient documentation

## 2015-08-08 DIAGNOSIS — Z7982 Long term (current) use of aspirin: Secondary | ICD-10-CM | POA: Insufficient documentation

## 2015-08-08 DIAGNOSIS — M79609 Pain in unspecified limb: Secondary | ICD-10-CM | POA: Diagnosis not present

## 2015-08-08 DIAGNOSIS — Z9889 Other specified postprocedural states: Secondary | ICD-10-CM | POA: Insufficient documentation

## 2015-08-08 DIAGNOSIS — M79604 Pain in right leg: Secondary | ICD-10-CM

## 2015-08-08 DIAGNOSIS — E7800 Pure hypercholesterolemia, unspecified: Secondary | ICD-10-CM | POA: Insufficient documentation

## 2015-08-08 DIAGNOSIS — Z79899 Other long term (current) drug therapy: Secondary | ICD-10-CM | POA: Insufficient documentation

## 2015-08-08 DIAGNOSIS — Z7984 Long term (current) use of oral hypoglycemic drugs: Secondary | ICD-10-CM | POA: Insufficient documentation

## 2015-08-08 DIAGNOSIS — I739 Peripheral vascular disease, unspecified: Secondary | ICD-10-CM | POA: Insufficient documentation

## 2015-08-08 DIAGNOSIS — E119 Type 2 diabetes mellitus without complications: Secondary | ICD-10-CM | POA: Insufficient documentation

## 2015-08-08 DIAGNOSIS — I10 Essential (primary) hypertension: Secondary | ICD-10-CM | POA: Insufficient documentation

## 2015-08-08 LAB — CBC
HCT: 41.9 % (ref 36.0–46.0)
HEMOGLOBIN: 14.2 g/dL (ref 12.0–15.0)
MCH: 28.7 pg (ref 26.0–34.0)
MCHC: 33.9 g/dL (ref 30.0–36.0)
MCV: 84.6 fL (ref 78.0–100.0)
PLATELETS: 272 10*3/uL (ref 150–400)
RBC: 4.95 MIL/uL (ref 3.87–5.11)
RDW: 15.8 % — ABNORMAL HIGH (ref 11.5–15.5)
WBC: 8.9 10*3/uL (ref 4.0–10.5)

## 2015-08-08 LAB — COMPREHENSIVE METABOLIC PANEL
ALBUMIN: 4.1 g/dL (ref 3.5–5.0)
ALK PHOS: 82 U/L (ref 38–126)
ALT: 17 U/L (ref 14–54)
AST: 21 U/L (ref 15–41)
Anion gap: 13 (ref 5–15)
BUN: 8 mg/dL (ref 6–20)
CALCIUM: 9.8 mg/dL (ref 8.9–10.3)
CO2: 18 mmol/L — AB (ref 22–32)
CREATININE: 0.53 mg/dL (ref 0.44–1.00)
Chloride: 102 mmol/L (ref 101–111)
GFR calc Af Amer: >60 mL/min (ref >60)
GFR calc non Af Amer: >60 mL/min (ref >60)
GLUCOSE: 323 mg/dL — AB (ref 65–99)
Potassium: 4.2 mmol/L (ref 3.5–5.1)
SODIUM: 133 mmol/L — AB (ref 135–145)
TOTAL PROTEIN: 7.4 g/dL (ref 6.5–8.1)
Total Bilirubin: 0.4 mg/dL (ref 0.3–1.2)

## 2015-08-08 NOTE — ED Notes (Signed)
Pt here with c/o pain to left leg with redness to lower leg and she reports her toes appear blue. Hx of arterial clots in left leg and has peripheral arterial disease. + R pedal pulse, able to wiggle her toes.

## 2015-08-08 NOTE — Progress Notes (Signed)
VASCULAR LAB PRELIMINARY  PRELIMINARY  PRELIMINARY  PRELIMINARY  Right lower extremity venous duplex completed.     Right:  No evidence of DVT, superficial thrombosis, or Baker's cyst.   Janifer Adie, RVT, RDMS 08/08/2015, 7:36 PM

## 2015-08-08 NOTE — ED Notes (Addendum)
Patient left at this time with all belongings. Refused wheelchair  Pt requesting refills on "all of my medications." went to ask Dr. Venora Maples but observed patient ambulating out of department prior to speaking with him.

## 2015-08-08 NOTE — ED Provider Notes (Signed)
CSN: [id number]     Arrival date & time 08/08/15  1653 History   First MD Initiated Contact with Patient 08/08/15 [place]     Chief Complaint  Patient presents with  . Leg Pain      HPI Patient presents to the emergency department with complaints of bilateral lower legs right greater than left.  She has a history of arterial occlusion of her left lower extremity and has a prior AV Gore-Tex graft with a left femoral tibia bypass.  She states that she has always had pain in her legs since her prior arterial disease was diagnosed and she is usually managed with oxycodone but has lost a relationship with her prescribing doctor.  She reports increasing bilateral leg pain right greater than left.  No new swelling of her legs P she's concerned because she feels like her toes on her right foot might be slightly dusky.  She reports that she stop smoking cigarettes.   Past Medical History  Diagnosis Date  . Hypertension   . Diabetes mellitus with peripheral vascular disease ([place])   . Hypercholesterolemia   . Tobacco abuse   . Coronary artery disease     NON OBSTRUCTIVE   Past Surgical History  Procedure Laterality Date  . Carpal tunnel release    . Cardiac catheterization    . Thrombectomy and revision of arterioventous (av) goretex  graft Left 08/16/2013    Procedure: Left below knee popliteal and peroneal thrombectomy; tibial and peroneal endaterectomy with patch angioplasty;  Surgeon: Conrad Bath, MD;  Location: [facility];  Service: Vascular;  Laterality: Left;  . Abdominal angiogram N/A 08/16/2013    Procedure: ABDOMINAL ANGIOGRAM;  Surgeon: Conrad Lafayette, MD;  Location: [facility] CATH LAB;  Service: Cardiovascular;  Laterality: N/A;  . Percutaneous stent intervention Left 08/16/2013    Procedure: PERCUTANEOUS STENT INTERVENTION;  Surgeon: Conrad Oxford, MD;  Location: [facility] CATH LAB;  Service: Cardiovascular;  Laterality: Left;  SFA  . Lower extremity angiogram N/A 04/22/2014    Procedure: LOWER EXTREMITY  ANGIOGRAM;  Surgeon: Conrad Clover, MD;  Location: [facility] CATH LAB;  Service: Cardiovascular;  Laterality: N/A;  . Peripheral vascular catheterization N/A 12/11/2014    Procedure: Abdominal Aortogram;  Surgeon: Elam Dutch, MD;  Location: [place] CV LAB;  Service: Cardiovascular;  Laterality: N/A;  . Femoral-tibial bypass graft Left 12/11/2014    Procedure:  LEFT COMMON FEMORAL-POSTERIOR TIBIAL ARTERY BYPASS USING SAPHEOUS VEIN GRAFT;  Surgeon: Mal Misty, MD;  Location: [place];  Service: Vascular;  Laterality: Left;  . Vein harvest Left 12/11/2014    Procedure: VEIN HARVEST LEFT GREATER SAPHENOUS VEIN;  Surgeon: Mal Misty, MD;  Location: [place];  Service: Vascular;  Laterality: Left;  . Intraoperative arteriogram Left 12/11/2014    Procedure: INTRA OPERATIVE ARTERIOGRAM;  Surgeon: Mal Misty, MD;  Location: [facility] OR;  Service: Vascular;  Laterality: Left;   Family History  Problem Relation Age of Onset  . Heart failure Mother   . Heart attack Mother   . Heart disease Father     s/p CABG  . Diabetes    . High blood pressure    . High Cholesterol    . Lung cancer Maternal Uncle    Social History  Substance Use Topics  . Smoking status: Light Tobacco Smoker -- 1.00 packs/day for 30 years    Types: Cigarettes  . Smokeless tobacco: Never Used  . Alcohol Use: No   OB History  No data available     Review of Systems  All other systems reviewed and are negative.     Allergies  Fish allergy; Prednisone; Ibuprofen; and Keflex  Home Medications   Prior to Admission medications   Medication Sig Start Date End Date Taking? Authorizing Provider  aspirin EC 81 MG tablet Take 1 tablet (81 mg total) by mouth daily. 12/16/14   Alvia Grove, PA-C  atorvastatin (LIPITOR) 80 MG tablet Take 1 tablet (80 mg total) by mouth daily at 6 PM. 04/03/14   Sharmon Leyden Nickel, NP  ciprofloxacin (CIPRO) 500 MG tablet Take 1 tablet (500 mg total) by mouth 2 (two) times daily with a  meal. Patient not taking: Reported on 01/16/2015 01/02/15   Mal Misty, MD  gabapentin (NEURONTIN) 300 MG capsule Take 600 mg by mouth 3 (three) times daily.    Historical Provider, MD  glimepiride (AMARYL) 2 MG tablet Take 2 mg by mouth daily with breakfast.    Historical Provider, MD  lisinopril (PRINIVIL,ZESTRIL) 5 MG tablet Take 1 tablet (5 mg total) by mouth daily. 08/20/13   Delfina Redwood, MD  metFORMIN (GLUCOPHAGE) 500 MG tablet Take 1 tablet (500 mg total) by mouth 2 (two) times daily with a meal. 08/20/13   Delfina Redwood, MD  oxyCODONE (OXY IR/ROXICODONE) 5 MG immediate release tablet Take 1-2 tablets q 6hr / prn/ pain 12/23/14   Serafina Mitchell, MD   BP 197/93 mmHg  Pulse 103  Temp(Src) 97.8 F (36.6 C) (Oral)  Resp 20  Ht 5' 7.5" (1.715 m)  Wt 215 lb 7 oz (97.722 kg)  BMI 33.22 kg/m2  SpO2 99%  LMP 07/13/2015 Physical Exam  Constitutional: She is oriented to person, place, and time. She appears well-developed and well-nourished.  HENT:  Head: Normocephalic.  Eyes: EOM are normal.  Neck: Normal range of motion.  Pulmonary/Chest: Effort normal.  Abdominal: She exhibits no distension.  Musculoskeletal: Normal range of motion.  Dopplerable PT and DP pulses bilaterally.  No erythema or warmth or edema noted bilaterally in legs  Neurological: She is alert and oriented to person, place, and time.  Psychiatric: She has a normal mood and affect.  Nursing note and vitals reviewed.   ED Course  Procedures (including critical care time)  08/08/2015 VASCULAR LAB PRELIMINARY PRELIMINARY PRELIMINARY PRELIMINARY Right lower extremity venous duplex completed.  Right: No evidence of DVT, superficial thrombosis, or Baker's cyst.   Labs Review Labs Reviewed  CBC - Abnormal; Notable for the following:    RDW 15.8 (*)    All other components within normal limits  COMPREHENSIVE METABOLIC PANEL - Abnormal; Notable for the following:    Sodium 133 (*)    CO2 18 (*)     Glucose, Bld 323 (*)    All other components within normal limits    Imaging Review No results found. I have personally reviewed and evaluated these images and lab results as part of my medical decision-making.   EKG Interpretation None      MDM   Final diagnoses:  None    No signs cellulitis.  Normal pulses in bilateral feet.  Right lower extremity duplex without DVT.  Discharge home in good condition.    Jola Schmidt, MD 08/08/15 2031

## 2015-08-08 NOTE — ED Notes (Signed)
Patient transported to vascular. 

## 2016-10-05 IMAGING — CR DG CHEST 1V PORT
1 series · 1 of 1 positions shown · non-contrast
Comparison: 08/01/2007

CLINICAL DATA: Blood clots in legs.

EXAM:
PORTABLE CHEST - 1 VIEW

[AP]
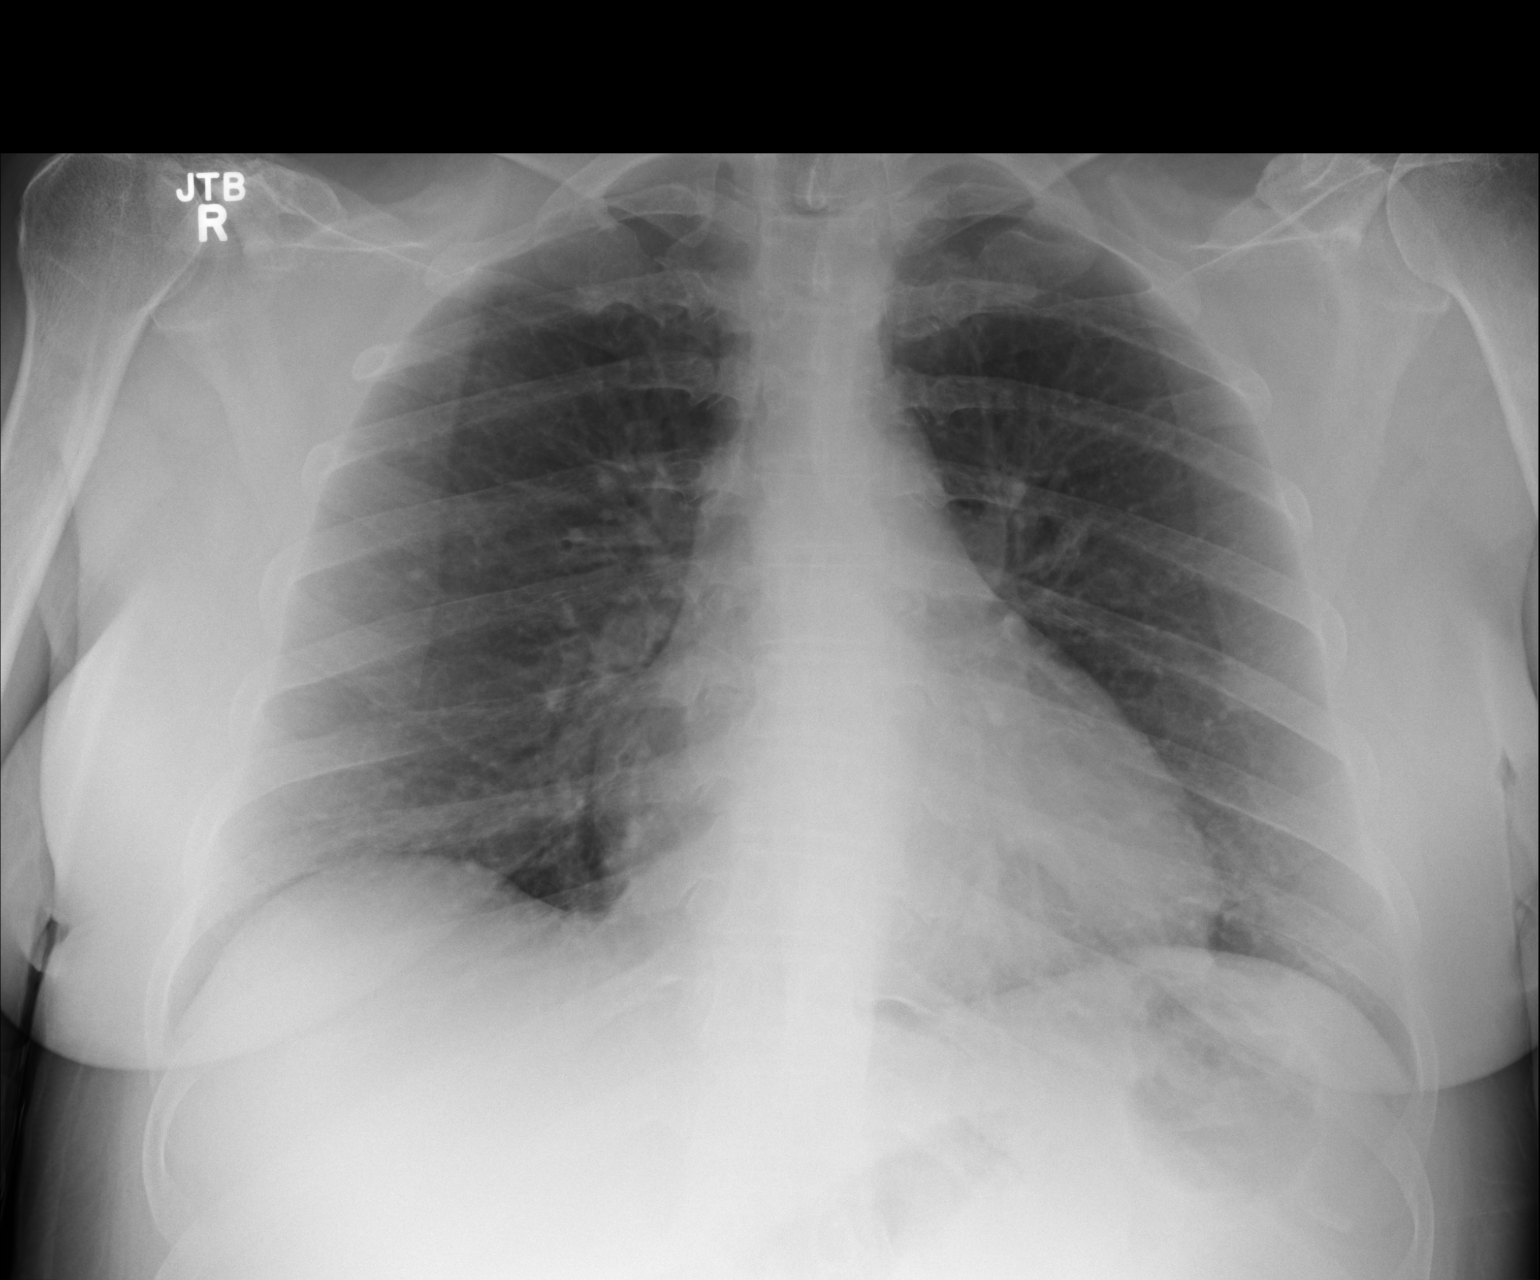

[1 of 1 positions shown; findings below may reference images not displayed]

FINDINGS: The cardiac silhouette, mediastinal and hilar contours are within
normal limits and stable. The lungs are clear. No pleural effusion.
The bony thorax is intact.
IMPRESSION: No acute cardiopulmonary findings.

## 2016-10-22 ENCOUNTER — Encounter: Payer: Self-pay | Admitting: Physician Assistant

## 2016-11-22 DIAGNOSIS — G894 Chronic pain syndrome: Secondary | ICD-10-CM | POA: Diagnosis not present

## 2016-11-22 DIAGNOSIS — I10 Essential (primary) hypertension: Secondary | ICD-10-CM | POA: Diagnosis not present

## 2016-11-22 DIAGNOSIS — M25552 Pain in left hip: Secondary | ICD-10-CM | POA: Diagnosis not present

## 2016-11-22 DIAGNOSIS — E1151 Type 2 diabetes mellitus with diabetic peripheral angiopathy without gangrene: Secondary | ICD-10-CM | POA: Diagnosis not present

## 2016-11-22 DIAGNOSIS — M79662 Pain in left lower leg: Secondary | ICD-10-CM | POA: Diagnosis not present

## 2016-12-23 DIAGNOSIS — I10 Essential (primary) hypertension: Secondary | ICD-10-CM | POA: Diagnosis not present

## 2016-12-23 DIAGNOSIS — E1151 Type 2 diabetes mellitus with diabetic peripheral angiopathy without gangrene: Secondary | ICD-10-CM | POA: Diagnosis not present

## 2016-12-23 DIAGNOSIS — G894 Chronic pain syndrome: Secondary | ICD-10-CM | POA: Diagnosis not present

## 2016-12-23 DIAGNOSIS — M25552 Pain in left hip: Secondary | ICD-10-CM | POA: Diagnosis not present

## 2016-12-23 DIAGNOSIS — M79662 Pain in left lower leg: Secondary | ICD-10-CM | POA: Diagnosis not present

## 2017-01-26 DIAGNOSIS — G894 Chronic pain syndrome: Secondary | ICD-10-CM | POA: Diagnosis not present

## 2017-01-26 DIAGNOSIS — R634 Abnormal weight loss: Secondary | ICD-10-CM | POA: Diagnosis not present

## 2017-01-26 DIAGNOSIS — E785 Hyperlipidemia, unspecified: Secondary | ICD-10-CM | POA: Diagnosis not present

## 2017-01-26 DIAGNOSIS — Z1389 Encounter for screening for other disorder: Secondary | ICD-10-CM | POA: Diagnosis not present

## 2017-01-26 DIAGNOSIS — F1121 Opioid dependence, in remission: Secondary | ICD-10-CM | POA: Diagnosis not present

## 2017-01-26 DIAGNOSIS — R5383 Other fatigue: Secondary | ICD-10-CM | POA: Diagnosis not present

## 2017-01-26 DIAGNOSIS — E119 Type 2 diabetes mellitus without complications: Secondary | ICD-10-CM | POA: Diagnosis not present

## 2017-01-26 DIAGNOSIS — I10 Essential (primary) hypertension: Secondary | ICD-10-CM | POA: Diagnosis not present

## 2017-01-26 DIAGNOSIS — F17200 Nicotine dependence, unspecified, uncomplicated: Secondary | ICD-10-CM | POA: Diagnosis not present

## 2017-01-27 DIAGNOSIS — R05 Cough: Secondary | ICD-10-CM | POA: Diagnosis not present

## 2017-01-27 DIAGNOSIS — R5383 Other fatigue: Secondary | ICD-10-CM | POA: Diagnosis not present

## 2017-01-27 DIAGNOSIS — F17200 Nicotine dependence, unspecified, uncomplicated: Secondary | ICD-10-CM | POA: Diagnosis not present

## 2017-01-27 DIAGNOSIS — R634 Abnormal weight loss: Secondary | ICD-10-CM | POA: Diagnosis not present

## 2017-02-09 DIAGNOSIS — I10 Essential (primary) hypertension: Secondary | ICD-10-CM | POA: Diagnosis not present

## 2017-02-09 DIAGNOSIS — G894 Chronic pain syndrome: Secondary | ICD-10-CM | POA: Diagnosis not present

## 2017-02-09 DIAGNOSIS — F17200 Nicotine dependence, unspecified, uncomplicated: Secondary | ICD-10-CM | POA: Diagnosis not present

## 2017-02-09 DIAGNOSIS — F1121 Opioid dependence, in remission: Secondary | ICD-10-CM | POA: Diagnosis not present

## 2017-02-14 DIAGNOSIS — E119 Type 2 diabetes mellitus without complications: Secondary | ICD-10-CM | POA: Diagnosis not present

## 2017-02-14 DIAGNOSIS — H52223 Regular astigmatism, bilateral: Secondary | ICD-10-CM | POA: Diagnosis not present

## 2017-02-14 DIAGNOSIS — Z7984 Long term (current) use of oral hypoglycemic drugs: Secondary | ICD-10-CM | POA: Diagnosis not present

## 2017-02-14 DIAGNOSIS — H5213 Myopia, bilateral: Secondary | ICD-10-CM | POA: Diagnosis not present

## 2017-02-22 DIAGNOSIS — E119 Type 2 diabetes mellitus without complications: Secondary | ICD-10-CM | POA: Diagnosis not present

## 2017-02-22 DIAGNOSIS — Z1389 Encounter for screening for other disorder: Secondary | ICD-10-CM | POA: Diagnosis not present

## 2017-02-22 DIAGNOSIS — R7309 Other abnormal glucose: Secondary | ICD-10-CM | POA: Diagnosis not present

## 2017-02-22 DIAGNOSIS — F1121 Opioid dependence, in remission: Secondary | ICD-10-CM | POA: Diagnosis not present

## 2017-02-22 DIAGNOSIS — R5383 Other fatigue: Secondary | ICD-10-CM | POA: Diagnosis not present

## 2017-02-22 DIAGNOSIS — J209 Acute bronchitis, unspecified: Secondary | ICD-10-CM | POA: Diagnosis not present

## 2017-03-31 DIAGNOSIS — Z79899 Other long term (current) drug therapy: Secondary | ICD-10-CM | POA: Diagnosis not present

## 2017-03-31 DIAGNOSIS — R9431 Abnormal electrocardiogram [ECG] [EKG]: Secondary | ICD-10-CM | POA: Diagnosis not present

## 2017-03-31 DIAGNOSIS — R001 Bradycardia, unspecified: Secondary | ICD-10-CM | POA: Diagnosis not present

## 2017-05-03 DIAGNOSIS — G894 Chronic pain syndrome: Secondary | ICD-10-CM | POA: Diagnosis not present

## 2017-05-03 DIAGNOSIS — Z1389 Encounter for screening for other disorder: Secondary | ICD-10-CM | POA: Diagnosis not present

## 2017-05-03 DIAGNOSIS — I739 Peripheral vascular disease, unspecified: Secondary | ICD-10-CM | POA: Diagnosis not present

## 2017-05-03 DIAGNOSIS — F17200 Nicotine dependence, unspecified, uncomplicated: Secondary | ICD-10-CM | POA: Diagnosis not present

## 2017-05-03 DIAGNOSIS — R7309 Other abnormal glucose: Secondary | ICD-10-CM | POA: Diagnosis not present

## 2017-05-03 DIAGNOSIS — R634 Abnormal weight loss: Secondary | ICD-10-CM | POA: Diagnosis not present

## 2017-05-03 DIAGNOSIS — R5383 Other fatigue: Secondary | ICD-10-CM | POA: Diagnosis not present

## 2017-05-03 DIAGNOSIS — I10 Essential (primary) hypertension: Secondary | ICD-10-CM | POA: Diagnosis not present

## 2017-05-03 DIAGNOSIS — E119 Type 2 diabetes mellitus without complications: Secondary | ICD-10-CM | POA: Diagnosis not present

## 2017-10-06 DIAGNOSIS — R1111 Vomiting without nausea: Secondary | ICD-10-CM | POA: Diagnosis not present

## 2017-10-06 DIAGNOSIS — F1111 Opioid abuse, in remission: Secondary | ICD-10-CM | POA: Diagnosis not present

## 2017-10-06 DIAGNOSIS — E1142 Type 2 diabetes mellitus with diabetic polyneuropathy: Secondary | ICD-10-CM | POA: Diagnosis not present

## 2017-10-06 DIAGNOSIS — Z1339 Encounter for screening examination for other mental health and behavioral disorders: Secondary | ICD-10-CM | POA: Diagnosis not present

## 2017-10-06 DIAGNOSIS — I2510 Atherosclerotic heart disease of native coronary artery without angina pectoris: Secondary | ICD-10-CM | POA: Diagnosis not present

## 2017-10-06 DIAGNOSIS — I10 Essential (primary) hypertension: Secondary | ICD-10-CM | POA: Diagnosis not present

## 2017-10-06 DIAGNOSIS — G894 Chronic pain syndrome: Secondary | ICD-10-CM | POA: Diagnosis not present

## 2017-10-06 DIAGNOSIS — Z6822 Body mass index (BMI) 22.0-22.9, adult: Secondary | ICD-10-CM | POA: Diagnosis not present

## 2017-10-06 DIAGNOSIS — Z1331 Encounter for screening for depression: Secondary | ICD-10-CM | POA: Diagnosis not present

## 2017-10-06 DIAGNOSIS — R300 Dysuria: Secondary | ICD-10-CM | POA: Diagnosis not present

## 2017-10-06 DIAGNOSIS — Z720 Tobacco use: Secondary | ICD-10-CM | POA: Diagnosis not present

## 2017-10-06 DIAGNOSIS — J209 Acute bronchitis, unspecified: Secondary | ICD-10-CM | POA: Diagnosis not present

## 2017-10-07 DIAGNOSIS — N390 Urinary tract infection, site not specified: Secondary | ICD-10-CM | POA: Diagnosis not present

## 2017-10-20 ENCOUNTER — Encounter: Payer: Self-pay | Admitting: Cardiology

## 2017-10-26 ENCOUNTER — Ambulatory Visit: Payer: PPO | Admitting: Cardiology

## 2018-02-15 ENCOUNTER — Emergency Department (HOSPITAL_COMMUNITY): Payer: PPO

## 2018-02-15 ENCOUNTER — Inpatient Hospital Stay (HOSPITAL_COMMUNITY)
Admission: EM | Admit: 2018-02-15 | Discharge: 2018-02-21 | DRG: 957 | Disposition: A | Discharge status: Home Health | Payer: PPO | Attending: General Surgery | Admitting: General Surgery

## 2018-02-15 ENCOUNTER — Encounter (HOSPITAL_COMMUNITY): Payer: Self-pay | Admitting: Emergency Medicine

## 2018-02-15 DIAGNOSIS — E785 Hyperlipidemia, unspecified: Secondary | ICD-10-CM | POA: Diagnosis present

## 2018-02-15 DIAGNOSIS — S36899A Unspecified injury of other intra-abdominal organs, initial encounter: Secondary | ICD-10-CM | POA: Diagnosis not present

## 2018-02-15 DIAGNOSIS — S06330A Contusion and laceration of cerebrum, unspecified, without loss of consciousness, initial encounter: Secondary | ICD-10-CM | POA: Diagnosis not present

## 2018-02-15 DIAGNOSIS — K7689 Other specified diseases of liver: Secondary | ICD-10-CM | POA: Diagnosis present

## 2018-02-15 DIAGNOSIS — Z8249 Family history of ischemic heart disease and other diseases of the circulatory system: Secondary | ICD-10-CM | POA: Diagnosis not present

## 2018-02-15 DIAGNOSIS — S066X9A Traumatic subarachnoid hemorrhage with loss of consciousness of unspecified duration, initial encounter: Secondary | ICD-10-CM | POA: Diagnosis not present

## 2018-02-15 DIAGNOSIS — S022XXA Fracture of nasal bones, initial encounter for closed fracture: Secondary | ICD-10-CM | POA: Diagnosis not present

## 2018-02-15 DIAGNOSIS — Z7902 Long term (current) use of antithrombotics/antiplatelets: Secondary | ICD-10-CM

## 2018-02-15 DIAGNOSIS — S3993XA Unspecified injury of pelvis, initial encounter: Secondary | ICD-10-CM | POA: Diagnosis not present

## 2018-02-15 DIAGNOSIS — I2510 Atherosclerotic heart disease of native coronary artery without angina pectoris: Secondary | ICD-10-CM | POA: Diagnosis not present

## 2018-02-15 DIAGNOSIS — S36031A Moderate laceration of spleen, initial encounter: Secondary | ICD-10-CM | POA: Diagnosis present

## 2018-02-15 DIAGNOSIS — R079 Chest pain, unspecified: Secondary | ICD-10-CM | POA: Diagnosis not present

## 2018-02-15 DIAGNOSIS — S35298A Other injury of branches of celiac and mesenteric artery, initial encounter: Principal | ICD-10-CM | POA: Diagnosis present

## 2018-02-15 DIAGNOSIS — D7389 Other diseases of spleen: Secondary | ICD-10-CM | POA: Diagnosis not present

## 2018-02-15 DIAGNOSIS — S8992XA Unspecified injury of left lower leg, initial encounter: Secondary | ICD-10-CM | POA: Diagnosis not present

## 2018-02-15 DIAGNOSIS — I10 Essential (primary) hypertension: Secondary | ICD-10-CM | POA: Diagnosis present

## 2018-02-15 DIAGNOSIS — S065X0A Traumatic subdural hemorrhage without loss of consciousness, initial encounter: Secondary | ICD-10-CM | POA: Diagnosis not present

## 2018-02-15 DIAGNOSIS — S36118A Other injury of liver, initial encounter: Secondary | ICD-10-CM

## 2018-02-15 DIAGNOSIS — Z91013 Allergy to seafood: Secondary | ICD-10-CM | POA: Diagnosis not present

## 2018-02-15 DIAGNOSIS — Z23 Encounter for immunization: Secondary | ICD-10-CM | POA: Diagnosis not present

## 2018-02-15 DIAGNOSIS — Z888 Allergy status to other drugs, medicaments and biological substances status: Secondary | ICD-10-CM

## 2018-02-15 DIAGNOSIS — I629 Nontraumatic intracranial hemorrhage, unspecified: Secondary | ICD-10-CM | POA: Diagnosis not present

## 2018-02-15 DIAGNOSIS — D62 Acute posthemorrhagic anemia: Secondary | ICD-10-CM | POA: Diagnosis not present

## 2018-02-15 DIAGNOSIS — S0003XA Contusion of scalp, initial encounter: Secondary | ICD-10-CM | POA: Diagnosis not present

## 2018-02-15 DIAGNOSIS — Z801 Family history of malignant neoplasm of trachea, bronchus and lung: Secondary | ICD-10-CM

## 2018-02-15 DIAGNOSIS — D141 Benign neoplasm of larynx: Secondary | ICD-10-CM | POA: Diagnosis not present

## 2018-02-15 DIAGNOSIS — S199XXA Unspecified injury of neck, initial encounter: Secondary | ICD-10-CM | POA: Diagnosis not present

## 2018-02-15 DIAGNOSIS — S8991XA Unspecified injury of right lower leg, initial encounter: Secondary | ICD-10-CM | POA: Diagnosis not present

## 2018-02-15 DIAGNOSIS — S0230XA Fracture of orbital floor, unspecified side, initial encounter for closed fracture: Secondary | ICD-10-CM | POA: Diagnosis present

## 2018-02-15 DIAGNOSIS — R739 Hyperglycemia, unspecified: Secondary | ICD-10-CM | POA: Diagnosis not present

## 2018-02-15 DIAGNOSIS — E7800 Pure hypercholesterolemia, unspecified: Secondary | ICD-10-CM | POA: Diagnosis present

## 2018-02-15 DIAGNOSIS — Z7982 Long term (current) use of aspirin: Secondary | ICD-10-CM

## 2018-02-15 DIAGNOSIS — K922 Gastrointestinal hemorrhage, unspecified: Secondary | ICD-10-CM | POA: Diagnosis not present

## 2018-02-15 DIAGNOSIS — J387 Other diseases of larynx: Secondary | ICD-10-CM | POA: Diagnosis not present

## 2018-02-15 DIAGNOSIS — S066X0A Traumatic subarachnoid hemorrhage without loss of consciousness, initial encounter: Secondary | ICD-10-CM | POA: Diagnosis not present

## 2018-02-15 DIAGNOSIS — S27322A Contusion of lung, bilateral, initial encounter: Secondary | ICD-10-CM

## 2018-02-15 DIAGNOSIS — Y92410 Unspecified street and highway as the place of occurrence of the external cause: Secondary | ICD-10-CM

## 2018-02-15 DIAGNOSIS — Z7984 Long term (current) use of oral hypoglycemic drugs: Secondary | ICD-10-CM | POA: Diagnosis not present

## 2018-02-15 DIAGNOSIS — S2220XA Unspecified fracture of sternum, initial encounter for closed fracture: Secondary | ICD-10-CM | POA: Diagnosis not present

## 2018-02-15 DIAGNOSIS — S0231XA Fracture of orbital floor, right side, initial encounter for closed fracture: Secondary | ICD-10-CM | POA: Diagnosis present

## 2018-02-15 DIAGNOSIS — R069 Unspecified abnormalities of breathing: Secondary | ICD-10-CM | POA: Diagnosis not present

## 2018-02-15 DIAGNOSIS — S2232XA Fracture of one rib, left side, initial encounter for closed fracture: Secondary | ICD-10-CM | POA: Diagnosis not present

## 2018-02-15 DIAGNOSIS — S299XXA Unspecified injury of thorax, initial encounter: Secondary | ICD-10-CM | POA: Diagnosis not present

## 2018-02-15 DIAGNOSIS — I609 Nontraumatic subarachnoid hemorrhage, unspecified: Secondary | ICD-10-CM

## 2018-02-15 DIAGNOSIS — S062X9A Diffuse traumatic brain injury with loss of consciousness of unspecified duration, initial encounter: Secondary | ICD-10-CM

## 2018-02-15 DIAGNOSIS — K661 Hemoperitoneum: Secondary | ICD-10-CM | POA: Diagnosis not present

## 2018-02-15 DIAGNOSIS — S065X9A Traumatic subdural hemorrhage with loss of consciousness of unspecified duration, initial encounter: Secondary | ICD-10-CM

## 2018-02-15 DIAGNOSIS — E1151 Type 2 diabetes mellitus with diabetic peripheral angiopathy without gangrene: Secondary | ICD-10-CM | POA: Diagnosis present

## 2018-02-15 DIAGNOSIS — I25119 Atherosclerotic heart disease of native coronary artery with unspecified angina pectoris: Secondary | ICD-10-CM | POA: Diagnosis not present

## 2018-02-15 DIAGNOSIS — S02401A Maxillary fracture, unspecified, initial encounter for closed fracture: Secondary | ICD-10-CM | POA: Diagnosis not present

## 2018-02-15 DIAGNOSIS — K567 Ileus, unspecified: Secondary | ICD-10-CM | POA: Diagnosis not present

## 2018-02-15 DIAGNOSIS — R9431 Abnormal electrocardiogram [ECG] [EKG]: Secondary | ICD-10-CM | POA: Diagnosis not present

## 2018-02-15 DIAGNOSIS — Z01810 Encounter for preprocedural cardiovascular examination: Secondary | ICD-10-CM | POA: Diagnosis not present

## 2018-02-15 DIAGNOSIS — S06899A Other specified intracranial injury with loss of consciousness of unspecified duration, initial encounter: Secondary | ICD-10-CM | POA: Diagnosis not present

## 2018-02-15 DIAGNOSIS — S0689AA Other specified intracranial injury with loss of consciousness status unknown, initial encounter: Secondary | ICD-10-CM

## 2018-02-15 DIAGNOSIS — Z9119 Patient's noncompliance with other medical treatment and regimen: Secondary | ICD-10-CM

## 2018-02-15 DIAGNOSIS — F17210 Nicotine dependence, cigarettes, uncomplicated: Secondary | ICD-10-CM | POA: Diagnosis not present

## 2018-02-15 DIAGNOSIS — S3991XA Unspecified injury of abdomen, initial encounter: Secondary | ICD-10-CM | POA: Diagnosis not present

## 2018-02-15 DIAGNOSIS — E1165 Type 2 diabetes mellitus with hyperglycemia: Secondary | ICD-10-CM | POA: Diagnosis not present

## 2018-02-15 DIAGNOSIS — S065XAA Traumatic subdural hemorrhage with loss of consciousness status unknown, initial encounter: Secondary | ICD-10-CM

## 2018-02-15 DIAGNOSIS — Z79891 Long term (current) use of opiate analgesic: Secondary | ICD-10-CM

## 2018-02-15 DIAGNOSIS — R0789 Other chest pain: Secondary | ICD-10-CM | POA: Diagnosis not present

## 2018-02-15 DIAGNOSIS — S0292XA Unspecified fracture of facial bones, initial encounter for closed fracture: Secondary | ICD-10-CM | POA: Diagnosis not present

## 2018-02-15 DIAGNOSIS — S022XXB Fracture of nasal bones, initial encounter for open fracture: Secondary | ICD-10-CM | POA: Diagnosis not present

## 2018-02-15 HISTORY — DX: Peripheral vascular disease, unspecified: I73.9

## 2018-02-15 HISTORY — DX: Personal history of other venous thrombosis and embolism: Z86.718

## 2018-02-15 HISTORY — DX: Opioid dependence, uncomplicated: F11.20

## 2018-02-15 LAB — CBC
HEMATOCRIT: 34.2 % — AB (ref 36.0–46.0)
Hemoglobin: 10.9 g/dL — ABNORMAL LOW (ref 12.0–15.0)
MCH: 28.5 pg (ref 26.0–34.0)
MCHC: 31.9 g/dL (ref 30.0–36.0)
MCV: 89.5 fL (ref 80.0–100.0)
NRBC: 0 % (ref 0.0–0.2)
PLATELETS: 217 10*3/uL (ref 150–400)
RBC: 3.82 MIL/uL — ABNORMAL LOW (ref 3.87–5.11)
RDW: 15.2 % (ref 11.5–15.5)
WBC: 9.1 10*3/uL (ref 4.0–10.5)

## 2018-02-15 LAB — I-STAT CHEM 8, ED
BUN: 17 mg/dL (ref 6–20)
Calcium, Ion: 1.09 mmol/L — ABNORMAL LOW (ref 1.15–1.40)
Chloride: 97 mmol/L — ABNORMAL LOW (ref 98–111)
Creatinine, Ser: 0.9 mg/dL (ref 0.44–1.00)
GLUCOSE: 123 mg/dL — AB (ref 70–99)
HEMATOCRIT: 33 % — AB (ref 36.0–46.0)
HEMOGLOBIN: 11.2 g/dL — AB (ref 12.0–15.0)
POTASSIUM: 3.7 mmol/L (ref 3.5–5.1)
Sodium: 134 mmol/L — ABNORMAL LOW (ref 135–145)
TCO2: 28 mmol/L (ref 22–32)

## 2018-02-15 LAB — I-STAT CG4 LACTIC ACID, ED: Lactic Acid, Venous: 1.15 mmol/L (ref 0.5–1.9)

## 2018-02-15 LAB — URINALYSIS, ROUTINE W REFLEX MICROSCOPIC
BILIRUBIN URINE: NEGATIVE
GLUCOSE, UA: NEGATIVE mg/dL
Hgb urine dipstick: NEGATIVE
KETONES UR: NEGATIVE mg/dL
Leukocytes, UA: NEGATIVE
NITRITE: NEGATIVE
PH: 6 (ref 5.0–8.0)
Protein, ur: NEGATIVE mg/dL
Specific Gravity, Urine: 1.008 (ref 1.005–1.030)

## 2018-02-15 LAB — COMPREHENSIVE METABOLIC PANEL
ALBUMIN: 3.4 g/dL — AB (ref 3.5–5.0)
ALK PHOS: 78 U/L (ref 38–126)
ALT: 12 U/L (ref 0–44)
AST: 24 U/L (ref 15–41)
Anion gap: 9 (ref 5–15)
BUN: 16 mg/dL (ref 6–20)
CALCIUM: 8.4 mg/dL — AB (ref 8.9–10.3)
CHLORIDE: 97 mmol/L — AB (ref 98–111)
CO2: 27 mmol/L (ref 22–32)
CREATININE: 0.84 mg/dL (ref 0.44–1.00)
GFR calc Af Amer: >60 mL/min (ref >60)
GFR calc non Af Amer: >60 mL/min (ref >60)
GLUCOSE: 126 mg/dL — AB (ref 70–99)
Potassium: 3.8 mmol/L (ref 3.5–5.1)
SODIUM: 133 mmol/L — AB (ref 135–145)
Total Bilirubin: 0.5 mg/dL (ref 0.3–1.2)
Total Protein: 5.8 g/dL — ABNORMAL LOW (ref 6.5–8.1)

## 2018-02-15 LAB — PROTIME-INR
INR: 0.96
PROTHROMBIN TIME: 12.7 s (ref 11.4–15.2)

## 2018-02-15 LAB — ETHANOL: Alcohol, Ethyl (B): <10 mg/dL (ref <10)

## 2018-02-15 LAB — SAMPLE TO BLOOD BANK

## 2018-02-15 LAB — ABO/RH: ABO/RH(D): O POS

## 2018-02-15 MED ORDER — IOHEXOL 300 MG/ML  SOLN
100.0000 mL | Freq: Once | INTRAMUSCULAR | Status: AC | PRN
  Administered 2018-02-15: 100 mL via INTRAVENOUS

## 2018-02-15 MED ORDER — ONDANSETRON HCL 4 MG/2ML IJ SOLN
4.0000 mg | Freq: Once | INTRAMUSCULAR | Status: DC
  Filled 2018-02-15: qty 2

## 2018-02-15 MED ORDER — ONDANSETRON HCL 4 MG/2ML IJ SOLN
4.0000 mg | Freq: Once | INTRAMUSCULAR | Status: AC
  Administered 2018-02-15: 4 mg via INTRAVENOUS

## 2018-02-15 MED ORDER — METOPROLOL TARTRATE 5 MG/5ML IV SOLN
5.0000 mg | Freq: Four times a day (QID) | INTRAVENOUS | Status: DC | PRN
  Administered 2018-02-18: 5 mg via INTRAVENOUS
  Filled 2018-02-15: qty 5

## 2018-02-15 MED ORDER — ONDANSETRON HCL 4 MG/2ML IJ SOLN
4.0000 mg | Freq: Four times a day (QID) | INTRAMUSCULAR | Status: DC | PRN
  Administered 2018-02-16: 4 mg via INTRAVENOUS
  Filled 2018-02-15: qty 2

## 2018-02-15 MED ORDER — PANTOPRAZOLE SODIUM 40 MG PO TBEC
40.0000 mg | DELAYED_RELEASE_TABLET | Freq: Every day | ORAL | Status: DC
  Administered 2018-02-19 – 2018-02-21 (×2): 40 mg via ORAL
  Filled 2018-02-15 (×2): qty 1

## 2018-02-15 MED ORDER — ONDANSETRON 4 MG PO TBDP: 4.0000 mg | ORAL_TABLET | Freq: Four times a day (QID) | ORAL | Status: DC | PRN

## 2018-02-15 MED ORDER — PANTOPRAZOLE SODIUM 40 MG IV SOLR
40.0000 mg | Freq: Every day | INTRAVENOUS | Status: DC
  Administered 2018-02-17 – 2018-02-18 (×2): 40 mg via INTRAVENOUS
  Filled 2018-02-15 (×2): qty 40

## 2018-02-15 MED ORDER — SODIUM CHLORIDE 0.9 % IV BOLUS
1000.0000 mL | Freq: Once | INTRAVENOUS | Status: AC
  Administered 2018-02-15: 1000 mL via INTRAVENOUS

## 2018-02-15 MED ORDER — POTASSIUM CHLORIDE IN NACL 20-0.9 MEQ/L-% IV SOLN
INTRAVENOUS | Status: DC
  Administered 2018-02-16 – 2018-02-20 (×4): via INTRAVENOUS
  Filled 2018-02-15 (×4): qty 1000

## 2018-02-15 MED ORDER — TETANUS-DIPHTH-ACELL PERTUSSIS 5-2.5-18.5 LF-MCG/0.5 IM SUSP
0.5000 mL | Freq: Once | INTRAMUSCULAR | Status: AC
  Administered 2018-02-15: 0.5 mL via INTRAMUSCULAR
  Filled 2018-02-15: qty 0.5

## 2018-02-15 MED ORDER — MORPHINE SULFATE (PF) 2 MG/ML IV SOLN
2.0000 mg | INTRAVENOUS | Status: DC | PRN
  Administered 2018-02-16 – 2018-02-17 (×6): 4 mg via INTRAVENOUS
  Administered 2018-02-17: 2 mg via INTRAVENOUS
  Administered 2018-02-17 (×3): 4 mg via INTRAVENOUS
  Filled 2018-02-15 (×5): qty 2
  Filled 2018-02-15: qty 1
  Filled 2018-02-15 (×5): qty 2

## 2018-02-15 MED ORDER — SODIUM CHLORIDE 0.9 % IV SOLN
INTRAVENOUS | Status: DC
  Administered 2018-02-16 – 2018-02-19 (×5): via INTRAVENOUS
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
## 2018-02-15 NOTE — ED Triage Notes (Signed)
See trauma chart

## 2018-02-15 NOTE — Progress Notes (Addendum)
Responded to TR lvl 2 page.  Offered spiritual presence during pt assessment.  Collbaorated with staff to contact pt sister, Charlett Nose [phone], who I reached at 18:20 and [facility] to support pt. Please pg chaplain if further support can be provided when family arrives.
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
## 2018-02-15 NOTE — ED Notes (Signed)
Family at bedside, dr Gilford Raid spoke with family Neurosurgery at bedside
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
## 2018-02-16 ENCOUNTER — Inpatient Hospital Stay (HOSPITAL_COMMUNITY): Payer: PPO | Admitting: Anesthesiology

## 2018-02-16 ENCOUNTER — Encounter (HOSPITAL_COMMUNITY): Admission: EM | Disposition: A | Discharge status: Home Health | Payer: Self-pay | Source: Home / Self Care

## 2018-02-16 ENCOUNTER — Inpatient Hospital Stay (HOSPITAL_COMMUNITY): Payer: PPO

## 2018-02-16 ENCOUNTER — Encounter (HOSPITAL_COMMUNITY): Payer: Self-pay | Admitting: *Deleted

## 2018-02-16 HISTORY — PX: LAPAROTOMY: SHX154

## 2018-02-16 HISTORY — PX: SPLENECTOMY, TOTAL: SHX788

## 2018-02-16 LAB — CBC
HCT: 21.7 % — ABNORMAL LOW (ref 36.0–46.0)
HEMATOCRIT: 30 % — AB (ref 36.0–46.0)
HEMATOCRIT: 27.2 % — AB (ref 36.0–46.0)
HEMOGLOBIN: 10.2 g/dL — AB (ref 12.0–15.0)
Hemoglobin: 7.2 g/dL — ABNORMAL LOW (ref 12.0–15.0)
Hemoglobin: 9.4 g/dL — ABNORMAL LOW (ref 12.0–15.0)
MCH: 29.1 pg (ref 26.0–34.0)
MCH: 28.2 pg (ref 26.0–34.0)
MCH: 28.6 pg (ref 26.0–34.0)
MCHC: 33.2 g/dL (ref 30.0–36.0)
MCHC: 34 g/dL (ref 30.0–36.0)
MCHC: 34.6 g/dL (ref 30.0–36.0)
MCV: 87.9 fL (ref 80.0–100.0)
MCV: 82.9 fL (ref 80.0–100.0)
MCV: 82.7 fL (ref 80.0–100.0)
PLATELETS: 171 10*3/uL (ref 150–400)
Platelets: 118 10*3/uL — ABNORMAL LOW (ref 150–400)
Platelets: 107 10*3/uL — ABNORMAL LOW (ref 150–400)
RBC: 2.47 MIL/uL — ABNORMAL LOW (ref 3.87–5.11)
RBC: 3.62 MIL/uL — ABNORMAL LOW (ref 3.87–5.11)
RBC: 3.29 MIL/uL — AB (ref 3.87–5.11)
RDW: 15.1 % (ref 11.5–15.5)
RDW: 15.5 % (ref 11.5–15.5)
RDW: 15.9 % — AB (ref 11.5–15.5)
WBC: 8.2 10*3/uL (ref 4.0–10.5)
WBC: 14.1 10*3/uL — AB (ref 4.0–10.5)
WBC: 12.1 10*3/uL — ABNORMAL HIGH (ref 4.0–10.5)
nRBC: 0 % (ref 0.0–0.2)
nRBC: 0 % (ref 0.0–0.2)
nRBC: 0 % (ref 0.0–0.2)

## 2018-02-16 LAB — POCT I-STAT EG7
ACID-BASE DEFICIT: 3 mmol/L — AB (ref 0.0–2.0)
BICARBONATE: 23 mmol/L (ref 20.0–28.0)
CALCIUM ION: 1.18 mmol/L (ref 1.15–1.40)
HCT: 21 % — ABNORMAL LOW (ref 36.0–46.0)
Hemoglobin: 7.1 g/dL — ABNORMAL LOW (ref 12.0–15.0)
O2 Saturation: 92 %
PH VEN: 7.326 (ref 7.250–7.430)
PO2 VEN: 70 mmHg — AB (ref 32.0–45.0)
Patient temperature: 36.8
Potassium: 4 mmol/L (ref 3.5–5.1)
Sodium: 137 mmol/L (ref 135–145)
TCO2: 24 mmol/L (ref 22–32)
pCO2, Ven: 44 mmHg (ref 44.0–60.0)

## 2018-02-16 LAB — COMPREHENSIVE METABOLIC PANEL
ALT: 14 U/L (ref 0–44)
AST: 20 U/L (ref 15–41)
Albumin: 2.8 g/dL — ABNORMAL LOW (ref 3.5–5.0)
Alkaline Phosphatase: 55 U/L (ref 38–126)
Anion gap: 5 (ref 5–15)
BILIRUBIN TOTAL: 0.3 mg/dL (ref 0.3–1.2)
BUN: 15 mg/dL (ref 6–20)
CALCIUM: 8 mg/dL — AB (ref 8.9–10.3)
CO2: 24 mmol/L (ref 22–32)
CREATININE: 0.67 mg/dL (ref 0.44–1.00)
Chloride: 106 mmol/L (ref 98–111)
Glucose, Bld: 152 mg/dL — ABNORMAL HIGH (ref 70–99)
Potassium: 4.2 mmol/L (ref 3.5–5.1)
Sodium: 135 mmol/L (ref 135–145)
TOTAL PROTEIN: 4.8 g/dL — AB (ref 6.5–8.1)

## 2018-02-16 LAB — GLUCOSE, CAPILLARY: GLUCOSE-CAPILLARY: 216 mg/dL — AB (ref 70–99)

## 2018-02-16 LAB — PREPARE RBC (CROSSMATCH)

## 2018-02-16 LAB — CDS SEROLOGY

## 2018-02-16 LAB — HIV ANTIBODY (ROUTINE TESTING W REFLEX): HIV SCREEN 4TH GENERATION: NONREACTIVE

## 2018-02-16 SURGERY — LAPAROTOMY, EXPLORATORY
Anesthesia: General | Site: Abdomen

## 2018-02-16 MED ORDER — HEMOSTATIC AGENTS (NO CHARGE) OPTIME
TOPICAL | Status: DC | PRN
  Administered 2018-02-16: 1 via TOPICAL

## 2018-02-16 MED ORDER — FENTANYL CITRATE (PF) 100 MCG/2ML IJ SOLN
25.0000 ug | INTRAMUSCULAR | Status: DC | PRN
  Administered 2018-02-16: 50 ug via INTRAVENOUS

## 2018-02-16 MED ORDER — PROMETHAZINE HCL 25 MG/ML IJ SOLN
6.2500 mg | INTRAMUSCULAR | Status: DC | PRN
  Administered 2018-02-16: 6.25 mg via INTRAVENOUS

## 2018-02-16 MED ORDER — FENTANYL CITRATE (PF) 100 MCG/2ML IJ SOLN
INTRAMUSCULAR | Status: DC | PRN
  Administered 2018-02-16: 100 ug via INTRAVENOUS

## 2018-02-16 MED ORDER — PROPOFOL 10 MG/ML IV BOLUS
INTRAVENOUS | Status: AC
  Filled 2018-02-16: qty 20

## 2018-02-16 MED ORDER — PROPOFOL 10 MG/ML IV BOLUS
INTRAVENOUS | Status: DC | PRN
  Administered 2018-02-16: 100 mg via INTRAVENOUS

## 2018-02-16 MED ORDER — KETAMINE HCL 50 MG/5ML IJ SOSY
PREFILLED_SYRINGE | INTRAMUSCULAR | Status: AC
  Filled 2018-02-16: qty 5

## 2018-02-16 MED ORDER — ACETAMINOPHEN 10 MG/ML IV SOLN
INTRAVENOUS | Status: AC
  Filled 2018-02-16: qty 100

## 2018-02-16 MED ORDER — ROCURONIUM BROMIDE 50 MG/5ML IV SOSY
PREFILLED_SYRINGE | INTRAVENOUS | Status: DC | PRN
  Administered 2018-02-16: 50 mg via INTRAVENOUS
  Administered 2018-02-16: 25 mg via INTRAVENOUS

## 2018-02-16 MED ORDER — FENTANYL CITRATE (PF) 100 MCG/2ML IJ SOLN
INTRAMUSCULAR | Status: AC
  Filled 2018-02-16: qty 2

## 2018-02-16 MED ORDER — MIDAZOLAM HCL 5 MG/5ML IJ SOLN
INTRAMUSCULAR | Status: DC | PRN
  Administered 2018-02-16: 2 mg via INTRAVENOUS

## 2018-02-16 MED ORDER — ROCURONIUM BROMIDE 50 MG/5ML IV SOSY
PREFILLED_SYRINGE | INTRAVENOUS | Status: AC
  Filled 2018-02-16: qty 10

## 2018-02-16 MED ORDER — SODIUM CHLORIDE 0.9% IV SOLUTION: Freq: Once | INTRAVENOUS | Status: DC

## 2018-02-16 MED ORDER — OXYCODONE HCL 5 MG/5ML PO SOLN: 5.0000 mg | Freq: Once | ORAL | Status: DC | PRN

## 2018-02-16 MED ORDER — CEFAZOLIN SODIUM-DEXTROSE 2-4 GM/100ML-% IV SOLN
2.0000 g | Freq: Once | INTRAVENOUS | Status: AC
  Administered 2018-02-16: 2 g via INTRAVENOUS
  Filled 2018-02-16: qty 100

## 2018-02-16 MED ORDER — KETAMINE HCL 10 MG/ML IJ SOLN
INTRAMUSCULAR | Status: DC | PRN
  Administered 2018-02-16: 30 mg via INTRAVENOUS

## 2018-02-16 MED ORDER — LACTATED RINGERS IV SOLN: INTRAVENOUS | Status: DC

## 2018-02-16 MED ORDER — SODIUM CHLORIDE 0.9% IV SOLUTION
Freq: Once | INTRAVENOUS | Status: AC
  Administered 2018-02-17: 200 mL via INTRAVENOUS

## 2018-02-16 MED ORDER — LIDOCAINE 2% (20 MG/ML) 5 ML SYRINGE
INTRAMUSCULAR | Status: AC
  Filled 2018-02-16: qty 5

## 2018-02-16 MED ORDER — PROMETHAZINE HCL 25 MG/ML IJ SOLN
INTRAMUSCULAR | Status: AC
  Filled 2018-02-16: qty 1

## 2018-02-16 MED ORDER — LIDOCAINE 2% (20 MG/ML) 5 ML SYRINGE
INTRAMUSCULAR | Status: DC | PRN
  Administered 2018-02-16: 80 mg via INTRAVENOUS

## 2018-02-16 MED ORDER — ONDANSETRON HCL 4 MG/2ML IJ SOLN
INTRAMUSCULAR | Status: DC | PRN
  Administered 2018-02-16: 4 mg via INTRAVENOUS

## 2018-02-16 MED ORDER — HYDROMORPHONE HCL 1 MG/ML IJ SOLN
INTRAMUSCULAR | Status: DC | PRN
  Administered 2018-02-16: 0.5 mg via INTRAVENOUS

## 2018-02-16 MED ORDER — ACETAMINOPHEN 10 MG/ML IV SOLN
INTRAVENOUS | Status: DC | PRN
  Administered 2018-02-16: 1000 mg via INTRAVENOUS

## 2018-02-16 MED ORDER — PHENYLEPHRINE 40 MCG/ML (10ML) SYRINGE FOR IV PUSH (FOR BLOOD PRESSURE SUPPORT)
PREFILLED_SYRINGE | INTRAVENOUS | Status: AC
  Filled 2018-02-16: qty 10

## 2018-02-16 MED ORDER — SUGAMMADEX SODIUM 200 MG/2ML IV SOLN
INTRAVENOUS | Status: DC | PRN
  Administered 2018-02-16: 200 mg via INTRAVENOUS

## 2018-02-16 MED ORDER — LACTATED RINGERS IV SOLN
INTRAVENOUS | Status: DC | PRN
  Administered 2018-02-16 (×2): via INTRAVENOUS

## 2018-02-16 MED ORDER — PHENYLEPHRINE HCL 10 MG/ML IJ SOLN
INTRAMUSCULAR | Status: DC | PRN
  Administered 2018-02-16 (×2): 80 ug via INTRAVENOUS
  Administered 2018-02-16 (×2): 120 ug via INTRAVENOUS

## 2018-02-16 MED ORDER — OXYCODONE HCL 5 MG PO TABS: 5.0000 mg | ORAL_TABLET | Freq: Once | ORAL | Status: DC | PRN

## 2018-02-16 MED ORDER — FENTANYL CITRATE (PF) 250 MCG/5ML IJ SOLN
INTRAMUSCULAR | Status: AC
  Filled 2018-02-16: qty 5

## 2018-02-16 MED ORDER — HYDROMORPHONE HCL 1 MG/ML IJ SOLN
INTRAMUSCULAR | Status: AC
  Filled 2018-02-16: qty 0.5

## 2018-02-16 MED ORDER — MIDAZOLAM HCL 2 MG/2ML IJ SOLN
INTRAMUSCULAR | Status: AC
  Filled 2018-02-16: qty 2

## 2018-02-16 MED ORDER — 0.9 % SODIUM CHLORIDE (POUR BTL) OPTIME
TOPICAL | Status: DC | PRN
  Administered 2018-02-16 (×4): 1000 mL

## 2018-02-16 SURGICAL SUPPLY — 44 items
APPLIER CLIP ROT 10 11.4 M/L (STAPLE) ×3
APR CLP MED LRG 11.4X10 (STAPLE) ×1
BLADE CLIPPER SURG (BLADE) IMPLANT
CANISTER SUCT 3000ML PPV (MISCELLANEOUS) ×3 IMPLANT
CHLORAPREP W/TINT 26ML (MISCELLANEOUS) ×3 IMPLANT
CLIP APPLIE ROT 10 11.4 M/L (STAPLE) IMPLANT
COVER SURGICAL LIGHT HANDLE (MISCELLANEOUS) ×3 IMPLANT
COVER WAND RF STERILE (DRAPES) ×3 IMPLANT
DRAPE LAPAROSCOPIC ABDOMINAL (DRAPES) ×3 IMPLANT
DRAPE WARM FLUID 44X44 (DRAPE) ×3 IMPLANT
DRSG OPSITE POSTOP 4X10 (GAUZE/BANDAGES/DRESSINGS) ×2 IMPLANT
DRSG OPSITE POSTOP 4X8 (GAUZE/BANDAGES/DRESSINGS) ×2 IMPLANT
ELECT BLADE 6.5 EXT (BLADE) ×2 IMPLANT
ELECT CAUTERY BLADE 6.4 (BLADE) ×3 IMPLANT
ELECT REM PT RETURN 9FT ADLT (ELECTROSURGICAL) ×3
ELECTRODE REM PT RTRN 9FT ADLT (ELECTROSURGICAL) ×1 IMPLANT
GLOVE BIO SURGEON STRL SZ8 (GLOVE) ×3 IMPLANT
GLOVE BIOGEL PI IND STRL 8 (GLOVE) ×1 IMPLANT
GLOVE BIOGEL PI INDICATOR 8 (GLOVE) ×2
GOWN STRL REUS W/ TWL LRG LVL3 (GOWN DISPOSABLE) ×1 IMPLANT
GOWN STRL REUS W/ TWL XL LVL3 (GOWN DISPOSABLE) ×1 IMPLANT
GOWN STRL REUS W/TWL LRG LVL3 (GOWN DISPOSABLE) ×6
GOWN STRL REUS W/TWL XL LVL3 (GOWN DISPOSABLE) ×3
HEMOSTAT SNOW SURGICEL 2X4 (HEMOSTASIS) ×2 IMPLANT
KIT BASIN OR (CUSTOM PROCEDURE TRAY) ×3 IMPLANT
KIT TURNOVER KIT B (KITS) ×3 IMPLANT
LIGASURE IMPACT 36 18CM CVD LR (INSTRUMENTS) IMPLANT
NS IRRIG 1000ML POUR BTL (IV SOLUTION) ×6 IMPLANT
PACK GENERAL/GYN (CUSTOM PROCEDURE TRAY) ×3 IMPLANT
PAD ARMBOARD 7.5X6 YLW CONV (MISCELLANEOUS) ×3 IMPLANT
SPECIMEN JAR LARGE (MISCELLANEOUS) IMPLANT
SPONGE LAP 18X18 RF (DISPOSABLE) ×6 IMPLANT
SPONGE LAP 18X18 X RAY DECT (DISPOSABLE) IMPLANT
STAPLER VISISTAT 35W (STAPLE) ×3 IMPLANT
SUCTION POOLE TIP (SUCTIONS) ×3 IMPLANT
SUT PDS AB 1 TP1 96 (SUTURE) ×6 IMPLANT
SUT SILK 0 TIES 10X30 (SUTURE) ×2 IMPLANT
SUT SILK 2 0 SH CR/8 (SUTURE) ×5 IMPLANT
SUT SILK 2 0 TIES 10X30 (SUTURE) ×3 IMPLANT
SUT SILK 3 0 SH CR/8 (SUTURE) ×3 IMPLANT
SUT SILK 3 0 TIES 10X30 (SUTURE) ×3 IMPLANT
TOWEL OR 17X26 10 PK STRL BLUE (TOWEL DISPOSABLE) ×3 IMPLANT
TRAY FOLEY MTR SLVR 16FR STAT (SET/KITS/TRAYS/PACK) ×2 IMPLANT
YANKAUER SUCT BULB TIP NO VENT (SUCTIONS) IMPLANT
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
## 2018-02-16 NOTE — Progress Notes (Signed)
Patient ID: Toni Logan, female   DOB: November 17, 1970, 47 y.o.   MRN: [id number] Patient seems to be doing well complains of hurts all over  Awake and communicative moves all extremities well with 5 out of 5 strength.  CT stable slight increase in hemorrhagic contusion parafalcine without mass effect  Mobilize per trauma continue cervical collar for now.
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
## 2018-02-16 NOTE — Anesthesia Postprocedure Evaluation (Signed)
Anesthesia Post Note  Patient: Research scientist (life sciences)  Procedure(s) Performed: EXPLORATORY LAPAROTOMY  AND LIGATION OF SHORT GASTRIC VESSELS  (N/A Abdomen) SPLENECTOMY (N/A Abdomen)     Patient location during evaluation: PACU Anesthesia Type: General Level of consciousness: awake and alert Pain management: pain level controlled Vital Signs Assessment: post-procedure vital signs reviewed and stable Respiratory status: spontaneous breathing, nonlabored ventilation and respiratory function stable Cardiovascular status: blood pressure returned to baseline and stable Postop Assessment: no apparent nausea or vomiting Anesthetic complications: no    Last Vitals:  Vitals:   02/16/18 1400 02/16/18 1500  BP: (!) 162/60 (!) 169/69  Pulse: 80 78  Resp: 13 15  Temp:    SpO2: 100% 98%    Last Pain:  Vitals:   02/16/18 1336  TempSrc: Axillary  PainSc:                  Audry Pili

## 2018-02-16 NOTE — Anesthesia Procedure Notes (Signed)
Procedure Name: Intubation Date/Time: 02/16/2018 9:17 AM Performed by: Inda Coke, CRNA Pre-anesthesia Checklist: Patient identified, Emergency Drugs available, Suction available and Patient being monitored Patient Re-evaluated:Patient Re-evaluated prior to induction Oxygen Delivery Method: Circle System Utilized Preoxygenation: Pre-oxygenation with 100% oxygen Induction Type: IV induction Ventilation: Mask ventilation without difficulty and Oral airway inserted - appropriate to patient size Laryngoscope Size: Mac and 3 Grade View: Grade I Tube type: Oral Tube size: 7.0 mm Number of attempts: 1 Airway Equipment and Method: Stylet and Oral airway Placement Confirmation: ETT inserted through vocal cords under direct vision,  positive ETCO2 and breath sounds checked- equal and bilateral Secured at: 22 cm Tube secured with: Tape Dental Injury: Teeth and Oropharynx as per pre-operative assessment
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
## 2018-02-16 NOTE — Transfer of Care (Signed)
Immediate Anesthesia Transfer of Care Note  Patient: Toni Logan  Procedure(s) Performed: EXPLORATORY LAPAROTOMY  AND LIGATION OF SHORT GASTRIC VESSELS  (N/A Abdomen) SPLENECTOMY (N/A Abdomen)  Patient Location: PACU  Anesthesia Type:General  Level of Consciousness: awake and drowsy  Airway & Oxygen Therapy: Patient Spontanous Breathing and Patient connected to nasal cannula oxygen  Post-op Assessment: Report given to RN and Post -op Vital signs reviewed and stable  Post vital signs: Reviewed and stable  Last Vitals:  Vitals Value Taken Time  BP 166/67 02/16/2018 10:59 AM  Temp    Pulse 81 02/16/2018 11:04 AM  Resp 20 02/16/2018 11:04 AM  SpO2 100 % 02/16/2018 11:04 AM  Vitals shown include unvalidated device data.  Last Pain:  Vitals:   02/16/18 0901  TempSrc: Oral  PainSc:          Complications: No apparent anesthesia complications

## 2018-02-17 ENCOUNTER — Inpatient Hospital Stay (HOSPITAL_COMMUNITY): Payer: PPO

## 2018-02-17 ENCOUNTER — Encounter (HOSPITAL_COMMUNITY): Payer: Self-pay | Admitting: Vascular Surgery

## 2018-02-17 ENCOUNTER — Encounter (HOSPITAL_COMMUNITY): Payer: Self-pay | Admitting: General Surgery

## 2018-02-17 LAB — TYPE AND SCREEN
ABO/RH(D): O POS
ANTIBODY SCREEN: NEGATIVE
UNIT DIVISION: 0
UNIT DIVISION: 0
Unit division: 0
Unit division: 0

## 2018-02-17 LAB — CBC
HCT: 25.3 % — ABNORMAL LOW (ref 36.0–46.0)
HEMATOCRIT: 25.1 % — AB (ref 36.0–46.0)
HEMATOCRIT: 26.1 % — AB (ref 36.0–46.0)
HEMOGLOBIN: 8.5 g/dL — AB (ref 12.0–15.0)
HEMOGLOBIN: 8.7 g/dL — AB (ref 12.0–15.0)
Hemoglobin: 8.5 g/dL — ABNORMAL LOW (ref 12.0–15.0)
MCH: 28.3 pg (ref 26.0–34.0)
MCH: 27.2 pg (ref 26.0–34.0)
MCH: 28.3 pg (ref 26.0–34.0)
MCHC: 32.6 g/dL (ref 30.0–36.0)
MCHC: 33.9 g/dL (ref 30.0–36.0)
MCHC: 34.4 g/dL (ref 30.0–36.0)
MCV: 82.4 fL (ref 80.0–100.0)
MCV: 83.4 fL (ref 80.0–100.0)
MCV: 83.7 fL (ref 80.0–100.0)
NRBC: 0 % (ref 0.0–0.2)
Platelets: 104 10*3/uL — ABNORMAL LOW (ref 150–400)
Platelets: 110 10*3/uL — ABNORMAL LOW (ref 150–400)
Platelets: 123 10*3/uL — ABNORMAL LOW (ref 150–400)
RBC: 3.13 MIL/uL — AB (ref 3.87–5.11)
RBC: 3.07 MIL/uL — AB (ref 3.87–5.11)
RBC: 3 MIL/uL — AB (ref 3.87–5.11)
RDW: 16.3 % — ABNORMAL HIGH (ref 11.5–15.5)
RDW: 16.6 % — ABNORMAL HIGH (ref 11.5–15.5)
RDW: 16.6 % — ABNORMAL HIGH (ref 11.5–15.5)
WBC: 10.6 10*3/uL — ABNORMAL HIGH (ref 4.0–10.5)
WBC: 11.3 10*3/uL — ABNORMAL HIGH (ref 4.0–10.5)
WBC: 12.2 10*3/uL — ABNORMAL HIGH (ref 4.0–10.5)
nRBC: 0 % (ref 0.0–0.2)
nRBC: 0 % (ref 0.0–0.2)

## 2018-02-17 LAB — PREPARE FRESH FROZEN PLASMA

## 2018-02-17 LAB — BPAM FFP
Blood Product Expiration Date: 201911122359
Blood Product Expiration Date: 201911122359
ISSUE DATE / TIME: 201911071443
ISSUE DATE / TIME: 201911071607
Unit Type and Rh: 9500
Unit Type and Rh: 9500

## 2018-02-17 LAB — BASIC METABOLIC PANEL
ANION GAP: 4 — AB (ref 5–15)
BUN: 6 mg/dL (ref 6–20)
CHLORIDE: 106 mmol/L (ref 98–111)
CO2: 29 mmol/L (ref 22–32)
Calcium: 8 mg/dL — ABNORMAL LOW (ref 8.9–10.3)
Creatinine, Ser: 0.57 mg/dL (ref 0.44–1.00)
GFR calc non Af Amer: >60 mL/min (ref >60)
GLUCOSE: 153 mg/dL — AB (ref 70–99)
Potassium: 3.6 mmol/L (ref 3.5–5.1)
Sodium: 139 mmol/L (ref 135–145)

## 2018-02-17 LAB — BPAM RBC
BLOOD PRODUCT EXPIRATION DATE: 201912012359
Blood Product Expiration Date: 201912012359
Blood Product Expiration Date: 201912022359
Blood Product Expiration Date: 201912032359
ISSUE DATE / TIME: 201911070712
ISSUE DATE / TIME: 201911070900
ISSUE DATE / TIME: 201911071012
ISSUE DATE / TIME: 201911071012
UNIT TYPE AND RH: 5100
UNIT TYPE AND RH: 5100
UNIT TYPE AND RH: 5100
Unit Type and Rh: 5100

## 2018-02-17 LAB — GLUCOSE, CAPILLARY
GLUCOSE-CAPILLARY: 141 mg/dL — AB (ref 70–99)
Glucose-Capillary: 92 mg/dL (ref 70–99)
Glucose-Capillary: 99 mg/dL (ref 70–99)
Glucose-Capillary: 92 mg/dL (ref 70–99)

## 2018-02-17 MED ORDER — SODIUM CHLORIDE 0.9% FLUSH: 9.0000 mL | INTRAVENOUS | Status: DC | PRN

## 2018-02-17 MED ORDER — INSULIN ASPART 100 UNIT/ML ~~LOC~~ SOLN
0.0000 [IU] | SUBCUTANEOUS | Status: DC
  Administered 2018-02-17: 5 [IU] via SUBCUTANEOUS
  Administered 2018-02-17 – 2018-02-19 (×2): 2 [IU] via SUBCUTANEOUS

## 2018-02-17 MED ORDER — ONDANSETRON HCL 4 MG/2ML IJ SOLN: 4.0000 mg | Freq: Four times a day (QID) | INTRAMUSCULAR | Status: DC | PRN

## 2018-02-17 MED ORDER — NALOXONE HCL 0.4 MG/ML IJ SOLN: 0.4000 mg | INTRAMUSCULAR | Status: DC | PRN

## 2018-02-17 MED ORDER — HYDROMORPHONE 1 MG/ML IV SOLN
INTRAVENOUS | Status: DC
  Administered 2018-02-17: 25 mg via INTRAVENOUS
  Administered 2018-02-17: 2.6 mg via INTRAVENOUS
  Administered 2018-02-18: 3 mg via INTRAVENOUS
  Administered 2018-02-18 (×2): 2.4 mg via INTRAVENOUS
  Administered 2018-02-19: 5.1 mg via INTRAVENOUS
  Administered 2018-02-19: 2.4 mg via INTRAVENOUS
  Administered 2018-02-19: 1.3 mg via INTRAVENOUS
  Administered 2018-02-19: 2.1 mg via INTRAVENOUS
  Administered 2018-02-19: via INTRAVENOUS
  Administered 2018-02-19: 2.7 mg via INTRAVENOUS
  Administered 2018-02-20: 2.4 mg via INTRAVENOUS
  Administered 2018-02-20: 3 mg via INTRAVENOUS
  Administered 2018-02-20: 2.4 mg via INTRAVENOUS
  Administered 2018-02-20: 2.7 mg via INTRAVENOUS
  Filled 2018-02-17 (×2): qty 25

## 2018-02-17 MED ORDER — DIPHENHYDRAMINE HCL 12.5 MG/5ML PO ELIX: 12.5000 mg | ORAL_SOLUTION | Freq: Four times a day (QID) | ORAL | Status: DC | PRN

## 2018-02-17 MED ORDER — DIPHENHYDRAMINE HCL 50 MG/ML IJ SOLN: 12.5000 mg | Freq: Four times a day (QID) | INTRAMUSCULAR | Status: DC | PRN
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
## 2018-02-17 NOTE — Progress Notes (Signed)
NEUROSURGERY PROGRESS NOTE  Doing well. Denies and headache. MAE well  Temp:  [97.7 F (36.5 C)-101 F (38.3 C)] 98.3 F (36.8 C) (11/08 0800) Pulse Rate:  [70-95] 72 (11/08 0800) Resp:  [11-31] 18 (11/08 0800) BP: (121-173)/(45-118) 147/85 (11/08 0800) SpO2:  [93 %-100 %] 96 % (11/08 0800)  Plan: No new nsgy recom  Eleonore Chiquito, NP 02/17/2018 10:46 AM
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
## 2018-02-17 NOTE — Progress Notes (Signed)
Anesthesia APP Notation:  Toni Logan DOB 1971-01-16 is currently admitted under MRN [id number]. This MRN is flagged to be merged with MRN [id number], but this cannot occur until patient is discharged. I spoke with ENT Lind Guest, MD this afternoon as she is currently planning to take patient to the OR on 02/20/18 at 0900 for closed reduction nasal fracture and septo reduction. The procedure is currently linked to patient's MRN [id number] and will need to be rescheduled under the current admission MRN. Patient has a history of vascular and cardiac disease (diffuse 3V CAD not felt amendable to PCI/bypass 08/02/07; mild AS 08/27/13 echo) that is outlined in my PAT Note under her MRN [id number]. Anesthesiologist Belinda Block, MD has also updated Georganna Skeans, MD. My full note can be found under MRN [id number].   [facility] Short Stay Center/Anesthesiology Phone [phone] 02/17/2018 5:00 PM

## 2018-02-17 NOTE — Progress Notes (Signed)
Otolaryngology Progress Notes  Underwent ex lap.   No vision changes NG tube in place.  Facial swelling improving.   PE:  Vitals:   02/17/18 1500 02/17/18 1609  BP: (!) 135/57   Pulse: 80   Resp: 17 18  Temp:  99.6 F (37.6 C)  SpO2: 98% 97%   EOMI, PERRL, minimal R inferior orbital rim stepoff.  Nasal bones grossly displaced. Limited intranasal exam 2/2 NGT.  Zygomas in good position. Palate immobile. Chin sensation intact.   A/P: Complex nasal bone and septal fractures. Right non displaced inferior orbital rim fx.   Will plan for closed nasal and septal reduction under general anesthesia on Monday, 02/20/18.   Please make NPO the night before.  Hold heparin (if applicable) after midnight.  No nose blowing.  Nasal saline spray.  Ice packs to face TID to minimize edema.  Soft diet x 4 weeks.    Helayne Seminole, MD

## 2018-02-18 ENCOUNTER — Inpatient Hospital Stay (HOSPITAL_COMMUNITY): Payer: PPO

## 2018-02-18 ENCOUNTER — Encounter (HOSPITAL_COMMUNITY): Payer: Self-pay

## 2018-02-18 LAB — BASIC METABOLIC PANEL
ANION GAP: 7 (ref 5–15)
BUN: 8 mg/dL (ref 6–20)
CALCIUM: 8.4 mg/dL — AB (ref 8.9–10.3)
CHLORIDE: 105 mmol/L (ref 98–111)
CO2: 28 mmol/L (ref 22–32)
Creatinine, Ser: 0.57 mg/dL (ref 0.44–1.00)
GFR calc non Af Amer: >60 mL/min (ref >60)
Glucose, Bld: 105 mg/dL — ABNORMAL HIGH (ref 70–99)
Potassium: 2.7 mmol/L — CL (ref 3.5–5.1)
SODIUM: 140 mmol/L (ref 135–145)

## 2018-02-18 LAB — CBC
HCT: 26.3 % — ABNORMAL LOW (ref 36.0–46.0)
HEMOGLOBIN: 8.7 g/dL — AB (ref 12.0–15.0)
MCH: 28.4 pg (ref 26.0–34.0)
MCHC: 33.1 g/dL (ref 30.0–36.0)
MCV: 85.9 fL (ref 80.0–100.0)
NRBC: 0 % (ref 0.0–0.2)
Platelets: 124 10*3/uL — ABNORMAL LOW (ref 150–400)
RBC: 3.06 MIL/uL — ABNORMAL LOW (ref 3.87–5.11)
RDW: 16.5 % — ABNORMAL HIGH (ref 11.5–15.5)
WBC: 12.4 10*3/uL — AB (ref 4.0–10.5)

## 2018-02-18 LAB — GLUCOSE, CAPILLARY
GLUCOSE-CAPILLARY: 97 mg/dL (ref 70–99)
GLUCOSE-CAPILLARY: 93 mg/dL (ref 70–99)
GLUCOSE-CAPILLARY: 92 mg/dL (ref 70–99)
Glucose-Capillary: 118 mg/dL — ABNORMAL HIGH (ref 70–99)
Glucose-Capillary: 94 mg/dL (ref 70–99)

## 2018-02-18 LAB — HEMOGLOBIN A1C
Hgb A1c MFr Bld: 5.5 % (ref 4.8–5.6)
MEAN PLASMA GLUCOSE: 111.15 mg/dL

## 2018-02-18 LAB — TROPONIN I
TROPONIN I: 0.51 ng/mL — AB (ref <0.03)
Troponin I: 0.37 ng/mL (ref <0.03)

## 2018-02-18 MED ORDER — ASPIRIN 81 MG PO CHEW
81.0000 mg | CHEWABLE_TABLET | Freq: Every day | ORAL | Status: DC
  Administered 2018-02-18 – 2018-02-21 (×3): 81 mg via ORAL
  Filled 2018-02-18 (×3): qty 1

## 2018-02-18 MED ORDER — POTASSIUM CHLORIDE 10 MEQ/100ML IV SOLN
INTRAVENOUS | Status: AC
  Administered 2018-02-18: 10 meq
  Filled 2018-02-18: qty 100

## 2018-02-18 MED ORDER — METOPROLOL SUCCINATE ER 25 MG PO TB24
25.0000 mg | ORAL_TABLET | Freq: Every day | ORAL | Status: DC
  Administered 2018-02-19: 25 mg via ORAL
  Filled 2018-02-18: qty 1

## 2018-02-18 MED ORDER — POTASSIUM CHLORIDE 10 MEQ/100ML IV SOLN
10.0000 meq | INTRAVENOUS | Status: AC
  Administered 2018-02-18 (×4): 10 meq via INTRAVENOUS
  Filled 2018-02-18 (×2): qty 100

## 2018-02-18 NOTE — Progress Notes (Signed)
Cardiologist notified of above troponin, no new orders given

## 2018-02-18 NOTE — Progress Notes (Signed)
CRITICAL VALUE ALERT  Critical Value:  Troponin 0.51  Date & Time Notied: 02/18/18 14:18hrs  Provider Notified: 14:18hrs  Orders Received/Actions taken: PA paged,waiting for return call
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
## 2018-02-18 NOTE — Progress Notes (Signed)
  Echocardiogram 2D Echocardiogram has been performed.  Merrie Roof F 02/18/2018, 2:39 PM

## 2018-02-18 NOTE — Progress Notes (Signed)
NEUROSURGERY PROGRESS NOTE  Doing well. No complaints or issues overnight. Able to [facility] and communicate appropriately.  Temp:  [99 F (37.2 C)-99.6 F (37.6 C)] 99.2 F (37.3 C) (11/09 0000) Pulse Rate:  [63-87] 73 (11/09 0700) Resp:  [6-22] 16 (11/09 0700) BP: (126-154)/(51-67) 138/60 (11/09 0700) SpO2:  [94 %-100 %] 97 % (11/09 0700)   Eleonore Chiquito, NP 02/18/2018 8:03 AM
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
## 2018-02-19 LAB — BASIC METABOLIC PANEL
ANION GAP: 7 (ref 5–15)
BUN: 9 mg/dL (ref 6–20)
CALCIUM: 8.2 mg/dL — AB (ref 8.9–10.3)
CO2: 25 mmol/L (ref 22–32)
CREATININE: 0.5 mg/dL (ref 0.44–1.00)
Chloride: 105 mmol/L (ref 98–111)
GFR calc Af Amer: >60 mL/min (ref >60)
Glucose, Bld: 92 mg/dL (ref 70–99)
Potassium: 3.1 mmol/L — ABNORMAL LOW (ref 3.5–5.1)
Sodium: 137 mmol/L (ref 135–145)

## 2018-02-19 LAB — CBC
HCT: 25.6 % — ABNORMAL LOW (ref 36.0–46.0)
Hemoglobin: 8.2 g/dL — ABNORMAL LOW (ref 12.0–15.0)
MCH: 27.6 pg (ref 26.0–34.0)
MCHC: 32 g/dL (ref 30.0–36.0)
MCV: 86.2 fL (ref 80.0–100.0)
NRBC: 0.3 % — AB (ref 0.0–0.2)
PLATELETS: 190 10*3/uL (ref 150–400)
RBC: 2.97 MIL/uL — ABNORMAL LOW (ref 3.87–5.11)
RDW: 15.9 % — AB (ref 11.5–15.5)
WBC: 10 10*3/uL (ref 4.0–10.5)

## 2018-02-19 LAB — GLUCOSE, CAPILLARY
GLUCOSE-CAPILLARY: 89 mg/dL (ref 70–99)
GLUCOSE-CAPILLARY: 91 mg/dL (ref 70–99)
Glucose-Capillary: 126 mg/dL — ABNORMAL HIGH (ref 70–99)
Glucose-Capillary: 93 mg/dL (ref 70–99)
Glucose-Capillary: 95 mg/dL (ref 70–99)
Glucose-Capillary: 98 mg/dL (ref 70–99)

## 2018-02-19 LAB — TROPONIN I: Troponin I: 0.32 ng/mL (ref <0.03)

## 2018-02-19 LAB — ECHOCARDIOGRAM COMPLETE
Height: 68 in
WEIGHTICAEL: 2444.46 [oz_av]

## 2018-02-19 MED ORDER — DULOXETINE HCL 60 MG PO CPEP
60.0000 mg | ORAL_CAPSULE | Freq: Every day | ORAL | Status: DC
  Administered 2018-02-19 – 2018-02-21 (×2): 60 mg via ORAL
  Filled 2018-02-19 (×2): qty 1

## 2018-02-19 MED ORDER — ISOSORBIDE MONONITRATE ER 60 MG PO TB24
60.0000 mg | ORAL_TABLET | Freq: Every day | ORAL | Status: DC
  Administered 2018-02-19 – 2018-02-21 (×2): 60 mg via ORAL
  Filled 2018-02-19 (×2): qty 1

## 2018-02-19 MED ORDER — DOCUSATE SODIUM 100 MG PO CAPS
100.0000 mg | ORAL_CAPSULE | Freq: Every day | ORAL | Status: DC
  Administered 2018-02-19: 100 mg via ORAL
  Filled 2018-02-19 (×2): qty 1

## 2018-02-19 MED ORDER — METOPROLOL SUCCINATE ER 50 MG PO TB24
50.0000 mg | ORAL_TABLET | Freq: Every day | ORAL | Status: DC
  Administered 2018-02-21: 50 mg via ORAL
  Filled 2018-02-19: qty 1
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
## 2018-02-19 NOTE — Plan of Care (Signed)
  Problem: Education: Goal: Knowledge of General Education information will improve Description: Including pain rating scale, medication(s)/side effects and non-pharmacologic comfort measures Outcome: Progressing   Problem: Clinical Measurements: Goal: Ability to maintain clinical measurements within normal limits will improve Outcome: Progressing Goal: Respiratory complications will improve Outcome: Progressing Goal: Cardiovascular complication will be avoided Outcome: Progressing   Problem: Coping: Goal: Level of anxiety will decrease Outcome: Progressing   Problem: Safety: Goal: Ability to remain free from injury will improve Outcome: Progressing   Problem: Skin Integrity: Goal: Risk for impaired skin integrity will decrease Outcome: Progressing   
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
## 2018-02-20 ENCOUNTER — Other Ambulatory Visit: Payer: Self-pay

## 2018-02-20 ENCOUNTER — Inpatient Hospital Stay (HOSPITAL_COMMUNITY): Payer: PPO | Admitting: Certified Registered Nurse Anesthetist

## 2018-02-20 ENCOUNTER — Encounter (HOSPITAL_COMMUNITY): Admission: EM | Disposition: A | Discharge status: Home Health | Payer: Self-pay | Source: Home / Self Care

## 2018-02-20 ENCOUNTER — Inpatient Hospital Stay (HOSPITAL_COMMUNITY): Admission: RE | Admit: 2018-02-20 | Payer: PPO | Source: Ambulatory Visit | Admitting: Otolaryngology

## 2018-02-20 ENCOUNTER — Encounter (HOSPITAL_COMMUNITY): Payer: Self-pay

## 2018-02-20 HISTORY — PX: CLOSED REDUCTION NASAL FRACTURE: SHX5365

## 2018-02-20 HISTORY — DX: Nonrheumatic aortic (valve) stenosis: I35.0

## 2018-02-20 LAB — SURGICAL PCR SCREEN
MRSA, PCR: NEGATIVE
Staphylococcus aureus: NEGATIVE

## 2018-02-20 LAB — GLUCOSE, CAPILLARY
GLUCOSE-CAPILLARY: 298 mg/dL — AB (ref 70–99)
GLUCOSE-CAPILLARY: 269 mg/dL — AB (ref 70–99)
Glucose-Capillary: 123 mg/dL — ABNORMAL HIGH (ref 70–99)
Glucose-Capillary: 93 mg/dL (ref 70–99)
Glucose-Capillary: 97 mg/dL (ref 70–99)
Glucose-Capillary: 98 mg/dL (ref 70–99)

## 2018-02-20 SURGERY — CLOSED REDUCTION, FRACTURE, NASAL BONE
Anesthesia: General

## 2018-02-20 SURGERY — CLOSED REDUCTION, FRACTURE, NASAL BONE
Anesthesia: General | Site: Nose

## 2018-02-20 MED ORDER — MUPIROCIN 2 % EX OINT
1.0000 "application " | TOPICAL_OINTMENT | Freq: Two times a day (BID) | CUTANEOUS | Status: DC
  Filled 2018-02-20: qty 22

## 2018-02-20 MED ORDER — FENTANYL CITRATE (PF) 100 MCG/2ML IJ SOLN: 25.0000 ug | INTRAMUSCULAR | Status: DC | PRN

## 2018-02-20 MED ORDER — MUPIROCIN 2 % EX OINT
TOPICAL_OINTMENT | CUTANEOUS | Status: DC | PRN
  Administered 2018-02-20: 1 via NASAL

## 2018-02-20 MED ORDER — OXYCODONE HCL 5 MG PO TABS
5.0000 mg | ORAL_TABLET | ORAL | Status: DC | PRN
  Administered 2018-02-20 – 2018-02-21 (×5): 10 mg via ORAL
  Filled 2018-02-20: qty 1
  Filled 2018-02-20 (×5): qty 2

## 2018-02-20 MED ORDER — LIDOCAINE 2% (20 MG/ML) 5 ML SYRINGE
INTRAMUSCULAR | Status: DC | PRN
  Administered 2018-02-20: 80 mg via INTRAVENOUS

## 2018-02-20 MED ORDER — OXYMETAZOLINE HCL 0.05 % NA SOLN
NASAL | Status: DC | PRN
  Administered 2018-02-20: 1

## 2018-02-20 MED ORDER — CEFAZOLIN SODIUM-DEXTROSE 1-4 GM/50ML-% IV SOLN
INTRAVENOUS | Status: AC
  Filled 2018-02-20: qty 50

## 2018-02-20 MED ORDER — ACETAMINOPHEN 500 MG PO TABS
1000.0000 mg | ORAL_TABLET | Freq: Four times a day (QID) | ORAL | Status: DC
  Administered 2018-02-20 – 2018-02-21 (×4): 1000 mg via ORAL
  Filled 2018-02-20 (×4): qty 2

## 2018-02-20 MED ORDER — MIDAZOLAM HCL 2 MG/2ML IJ SOLN
INTRAMUSCULAR | Status: DC | PRN
  Administered 2018-02-20: 2 mg via INTRAVENOUS

## 2018-02-20 MED ORDER — ROCURONIUM BROMIDE 10 MG/ML (PF) SYRINGE
PREFILLED_SYRINGE | INTRAVENOUS | Status: DC | PRN
  Administered 2018-02-20: 50 mg via INTRAVENOUS

## 2018-02-20 MED ORDER — LIDOCAINE-EPINEPHRINE 1 %-1:100000 IJ SOLN
INTRAMUSCULAR | Status: AC
  Filled 2018-02-20: qty 1

## 2018-02-20 MED ORDER — CEFAZOLIN SODIUM-DEXTROSE 1-4 GM/50ML-% IV SOLN: 1.0000 g | Freq: Once | INTRAVENOUS | Status: DC

## 2018-02-20 MED ORDER — OXYMETAZOLINE HCL 0.05 % NA SOLN
NASAL | Status: AC
  Filled 2018-02-20: qty 15

## 2018-02-20 MED ORDER — DEXAMETHASONE SODIUM PHOSPHATE 10 MG/ML IJ SOLN
INTRAMUSCULAR | Status: AC
  Filled 2018-02-20: qty 1

## 2018-02-20 MED ORDER — OXYMETAZOLINE HCL 0.05 % NA SOLN
NASAL | Status: AC
  Administered 2018-02-20: 1 via NASAL
  Filled 2018-02-20: qty 15

## 2018-02-20 MED ORDER — FENTANYL CITRATE (PF) 250 MCG/5ML IJ SOLN
INTRAMUSCULAR | Status: AC
  Filled 2018-02-20: qty 5

## 2018-02-20 MED ORDER — FENTANYL CITRATE (PF) 250 MCG/5ML IJ SOLN
INTRAMUSCULAR | Status: DC | PRN
  Administered 2018-02-20: 50 ug via INTRAVENOUS
  Administered 2018-02-20: 100 ug via INTRAVENOUS

## 2018-02-20 MED ORDER — NICOTINE 21 MG/24HR TD PT24
21.0000 mg | MEDICATED_PATCH | Freq: Every day | TRANSDERMAL | Status: DC
  Administered 2018-02-20 – 2018-02-21 (×2): 21 mg via TRANSDERMAL
  Filled 2018-02-20 (×2): qty 1

## 2018-02-20 MED ORDER — HYDROMORPHONE HCL 1 MG/ML IJ SOLN
0.5000 mg | INTRAMUSCULAR | Status: DC | PRN
  Administered 2018-02-20 – 2018-02-21 (×3): 1 mg via INTRAVENOUS
  Administered 2018-02-21: 0.5 mg via INTRAVENOUS
  Filled 2018-02-20 (×4): qty 1

## 2018-02-20 MED ORDER — METHADONE HCL 10 MG PO TABS
50.0000 mg | ORAL_TABLET | Freq: Every day | ORAL | Status: DC
  Administered 2018-02-20 – 2018-02-21 (×2): 50 mg via ORAL
  Filled 2018-02-20 (×2): qty 5

## 2018-02-20 MED ORDER — SODIUM CHLORIDE 0.9 % IR SOLN
Status: DC | PRN
  Administered 2018-02-20: 1000 mL

## 2018-02-20 MED ORDER — MIDAZOLAM HCL 2 MG/2ML IJ SOLN
INTRAMUSCULAR | Status: AC
  Filled 2018-02-20: qty 2

## 2018-02-20 MED ORDER — PROPOFOL 10 MG/ML IV BOLUS
INTRAVENOUS | Status: AC
  Filled 2018-02-20: qty 20

## 2018-02-20 MED ORDER — MUPIROCIN 2 % EX OINT
TOPICAL_OINTMENT | CUTANEOUS | Status: AC
  Filled 2018-02-20: qty 22

## 2018-02-20 MED ORDER — DEXAMETHASONE SODIUM PHOSPHATE 10 MG/ML IJ SOLN
INTRAMUSCULAR | Status: DC | PRN
  Administered 2018-02-20: 10 mg via INTRAVENOUS

## 2018-02-20 MED ORDER — LIDOCAINE-EPINEPHRINE 1 %-1:100000 IJ SOLN
INTRAMUSCULAR | Status: DC | PRN
  Administered 2018-02-20: 7 mL

## 2018-02-20 MED ORDER — ROCURONIUM BROMIDE 50 MG/5ML IV SOSY
PREFILLED_SYRINGE | INTRAVENOUS | Status: AC
  Filled 2018-02-20: qty 5

## 2018-02-20 MED ORDER — METHOCARBAMOL 750 MG PO TABS
750.0000 mg | ORAL_TABLET | Freq: Three times a day (TID) | ORAL | Status: DC
  Filled 2018-02-20 (×2): qty 1

## 2018-02-20 MED ORDER — SUGAMMADEX SODIUM 200 MG/2ML IV SOLN
INTRAVENOUS | Status: DC | PRN
  Administered 2018-02-20: 200 mg via INTRAVENOUS

## 2018-02-20 MED ORDER — LIDOCAINE 2% (20 MG/ML) 5 ML SYRINGE
INTRAMUSCULAR | Status: AC
  Filled 2018-02-20: qty 5

## 2018-02-20 MED ORDER — ONDANSETRON HCL 4 MG/2ML IJ SOLN
INTRAMUSCULAR | Status: DC | PRN
  Administered 2018-02-20: 4 mg via INTRAVENOUS

## 2018-02-20 MED ORDER — ONDANSETRON HCL 4 MG/2ML IJ SOLN
INTRAMUSCULAR | Status: AC
  Filled 2018-02-20: qty 2

## 2018-02-20 MED ORDER — 0.9 % SODIUM CHLORIDE (POUR BTL) OPTIME
TOPICAL | Status: DC | PRN
  Administered 2018-02-20: 1000 mL

## 2018-02-20 MED ORDER — LACTATED RINGERS IV SOLN
INTRAVENOUS | Status: DC
  Administered 2018-02-20: 09:00:00 via INTRAVENOUS

## 2018-02-20 MED ORDER — OXYMETAZOLINE HCL 0.05 % NA SOLN
1.0000 | Freq: Two times a day (BID) | NASAL | Status: DC
  Administered 2018-02-20: 1 via NASAL

## 2018-02-20 MED ORDER — PROPOFOL 10 MG/ML IV BOLUS
INTRAVENOUS | Status: DC | PRN
  Administered 2018-02-20: 120 mg via INTRAVENOUS

## 2018-02-20 SURGICAL SUPPLY — 26 items
BLADE INF TURB ROT M4 2 5PK (BLADE) IMPLANT
CANISTER SUCT 3000ML PPV (MISCELLANEOUS) ×2 IMPLANT
CLSR STERI-STRIP ANTIMIC 1/2X4 (GAUZE/BANDAGES/DRESSINGS) ×1 IMPLANT
COVER WAND RF STERILE (DRAPES) ×2 IMPLANT
CRADLE DONUT ADULT HEAD (MISCELLANEOUS) IMPLANT
DRAPE HALF SHEET 40X57 (DRAPES) ×2 IMPLANT
DRSG TELFA 3X8 NADH (GAUZE/BANDAGES/DRESSINGS) ×2 IMPLANT
GLOVE BIO SURGEON STRL SZ 6.5 (GLOVE) ×2 IMPLANT
GOWN STRL REUS W/ TWL LRG LVL3 (GOWN DISPOSABLE) ×2 IMPLANT
GOWN STRL REUS W/TWL LRG LVL3 (GOWN DISPOSABLE) ×4
KIT BASIN OR (CUSTOM PROCEDURE TRAY) ×2 IMPLANT
KIT TURNOVER KIT B (KITS) ×2 IMPLANT
NDL HYPO 25GX1X1/2 BEV (NEEDLE) IMPLANT
NDL HYPO 25X1 1.5 SAFETY (NEEDLE) ×1 IMPLANT
NEEDLE HYPO 25GX1X1/2 BEV (NEEDLE) IMPLANT
NEEDLE HYPO 25X1 1.5 SAFETY (NEEDLE) ×2 IMPLANT
NS IRRIG 1000ML POUR BTL (IV SOLUTION) ×2 IMPLANT
PAD ARMBOARD 7.5X6 YLW CONV (MISCELLANEOUS) ×2 IMPLANT
PAD DRESSING TELFA 3X8 NADH (GAUZE/BANDAGES/DRESSINGS) ×1 IMPLANT
PATTIES SURGICAL .5 X3 (DISPOSABLE) IMPLANT
SPLINT NASAL DOYLE BI-VL (GAUZE/BANDAGES/DRESSINGS) ×1 IMPLANT
SPLINT NASAL THERMO PLAST (MISCELLANEOUS) ×1 IMPLANT
SUT CHROMIC 4 0 PS 2 18 (SUTURE) IMPLANT
SUT CHROMIC GUT 2 0 PS 2 27 (SUTURE) ×2 IMPLANT
SUT PROLENE 3 0 PS 2 (SUTURE) ×1 IMPLANT
TRAY ENT MC OR (CUSTOM PROCEDURE TRAY) ×2 IMPLANT

## 2018-02-20 NOTE — Progress Notes (Signed)
Made aware on shift change patient refused insulin at 1600, CBG 298.  Patient has side effects of stomach cramping and "is scared of insulin."  Education given, provider paged.
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
## 2018-02-20 NOTE — Consult Note (Signed)
The surgical history remains accurate and without interval change. The condition still exists which makes the procedure necessary. The patient and/or family is aware of their condition and has been informed of the risks and benefits of surgery, as well as alternatives. All parties have elected to proceed with surgery.   Surgical plan: Closed nasal reduction, closed septal reduction   Helayne Seminole, MD
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
## 2018-02-20 NOTE — Progress Notes (Signed)
PT Cancellation Note  Patient Details Name: Savita Runner MRN: [id number] DOB: Nov 30, 1970   Cancelled Treatment:    Reason Eval/Treat Not Completed: Patient at procedure or test/unavailable   Was in surgery earlier when I attempted session;   Will follow,   Roney Marion, PT  Acute Rehabilitation Services Pager [phone] Office [phone]    Colletta [place] 02/20/2018, 11:29 AM

## 2018-02-20 NOTE — Transfer of Care (Signed)
Immediate Anesthesia Transfer of Care Note  Patient: Toni Logan  Procedure(s) Performed: CLOSED REDUCTION NASAL FRACTURE Biopsy of Epiglottis Mass (N/A Nose)  Patient Location: PACU  Anesthesia Type:General  Level of Consciousness: awake, drowsy and patient cooperative  Airway & Oxygen Therapy: Patient Spontanous Breathing and Patient connected to face mask oxygen  Post-op Assessment: Report given to RN and Post -op Vital signs reviewed and stable  Post vital signs: Reviewed and stable  Last Vitals:  Vitals Value Taken Time  BP 137/58 02/20/2018 10:39 AM  Temp    Pulse 96 02/20/2018 10:41 AM  Resp 14 02/20/2018 10:41 AM  SpO2 100 % 02/20/2018 10:41 AM  Vitals shown include unvalidated device data.  Last Pain:  Vitals:   02/20/18 0800  TempSrc:   PainSc: 5       Patients Stated Pain Goal: 4 ([birth year] 7948)  Complications: No apparent anesthesia complications

## 2018-02-20 NOTE — Progress Notes (Signed)
Scant serosanguinous drainage from r nare / drip pad placed / pt reassured.
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
## 2018-02-20 NOTE — Anesthesia Procedure Notes (Signed)
Procedure Name: Intubation Date/Time: 02/20/2018 9:53 AM Performed by: Bryson Corona, CRNA Pre-anesthesia Checklist: Patient identified, Emergency Drugs available, Suction available and Patient being monitored Patient Re-evaluated:Patient Re-evaluated prior to induction Oxygen Delivery Method: Circle System Utilized Preoxygenation: Pre-oxygenation with 100% oxygen Induction Type: IV induction Ventilation: Mask ventilation without difficulty Laryngoscope Size: Glidescope and 3 Grade View: Grade I Tube type: Oral Tube size: 7.0 mm Number of attempts: 1 Airway Equipment and Method: Stylet and Oral airway Placement Confirmation: ETT inserted through vocal cords under direct vision,  positive ETCO2 and breath sounds checked- equal and bilateral Secured at: 22 cm Tube secured with: Tape Dental Injury: Teeth and Oropharynx as per pre-operative assessment  Comments: DL by SRNA Summer. 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 DL x 2 with Glidescope 3. Able to maneuver around epiglottic mass with grade 1 view. Atraumatic intubation. Surgeon notified of mass and plan to do biopsy.

## 2018-02-20 NOTE — Op Note (Addendum)
DATE OF PROCEDURE: 02/20/2018    PRE-OPERATIVE DIAGNOSIS: nasal fracture, open Septal fracture   POST-OPERATIVE DIAGNOSIS: Fracture, open Septal fracture Septal hematoma Follicular cyst   PROCEDURE(S): Closed nasal reduction Closed septal reduction Direct laryngoscopy with biopsy   SURGEON: Gavin Pound, MD   ASSISTANT(S): none   ANESTHESIA: General endotracheal anesthesia    ESTIMATED BLOOD LOSS: 10 mL  SPECIMENS: none   COMPLICATIONS: None   OPERATIVE FINDINGS: There was significant nasal bone deviation to the right. Very narrow right naris.  There was septal deviation to the left, with a small septal hematoma along the floor.   During intubation, the anesthesiologist noted a mass on the epiglottis.  All parties in the room agreed that a direct laryngoscopy with biopsy of this mass while the patient was under general anesthesia was both appropriate and necessary.  The cyst was adequately marsupialized by opening it.  Cyst contents were sent to pathology.    OPERATIVE DETAILS: The patient was brought to the operating room and general anesthesia was induced. A timeout was performed. Afrin soaked pledgets were placed in the nose bilaterally and allowed to sit for 5 minutes. 1% lidocaine with 1:100,000 epinephrine was injected around the nasal bones and at the rhinion. The pledgets were removed and a butter knife was inserted into the left nasal cavity, taking care not insert the knife any further than the nasal bones as measured from the nasal tip to the medial canthus. Firm pressure was used to pull the left and right nasal bones anteriorly and laterally to reposition the nasal bone. The bone was felt to move into place. The septum was then replaced into its native position in a similar manner.  The nose was inspected and satisfactory reduction was noted. Afrin soaked pledgets were again placed in the nose.  Benzoin was placed on the nasal bridge and 1/4" steri  strips were placed on top. Pre-shaped Aquaplast was  molded over the nasal dorsum. The pledgets were removed. A Fraser tip suction was passed along the floor of the nose bilaterally.  The stomach contents were then suctioned with an orogastric tube. Next, direct laryngoscopy with biopsy was performed with the glide scope.  The multilobulated cyst was noted to be coming from the base of the lingual surface of epiglottis on the right side.  This was grasped with up-biting forceps in the cyst wall was marsupialized.  Proteinaceous and keratinaceous debris readily extruded from the cyst.  The cyst wall and contents were sent off to pathology.  Hemostasis was achieved with Afrin patties.  4% lidocaine was then sprayed on the cords and supraglottis.  This was then suctioned away.  The patient was then transferred to the care of the anesthesia staff, awakened, transported to PACU in good condition.  The patient was then awakened and transported to PACU in a good position.

## 2018-02-21 ENCOUNTER — Encounter (HOSPITAL_COMMUNITY): Payer: Self-pay | Admitting: Otolaryngology

## 2018-02-21 LAB — BASIC METABOLIC PANEL
ANION GAP: 7 (ref 5–15)
BUN: 8 mg/dL (ref 6–20)
CALCIUM: 8.6 mg/dL — AB (ref 8.9–10.3)
CO2: 29 mmol/L (ref 22–32)
Chloride: 101 mmol/L (ref 98–111)
Creatinine, Ser: 0.62 mg/dL (ref 0.44–1.00)
GFR calc non Af Amer: >60 mL/min (ref >60)
Glucose, Bld: 132 mg/dL — ABNORMAL HIGH (ref 70–99)
Potassium: 3.4 mmol/L — ABNORMAL LOW (ref 3.5–5.1)
SODIUM: 137 mmol/L (ref 135–145)

## 2018-02-21 LAB — GLUCOSE, CAPILLARY
GLUCOSE-CAPILLARY: 86 mg/dL (ref 70–99)
GLUCOSE-CAPILLARY: 113 mg/dL — AB (ref 70–99)
Glucose-Capillary: 134 mg/dL — ABNORMAL HIGH (ref 70–99)

## 2018-02-21 LAB — CBC
HEMATOCRIT: 26.3 % — AB (ref 36.0–46.0)
Hemoglobin: 8.5 g/dL — ABNORMAL LOW (ref 12.0–15.0)
MCH: 27.8 pg (ref 26.0–34.0)
MCHC: 32.3 g/dL (ref 30.0–36.0)
MCV: 85.9 fL (ref 80.0–100.0)
NRBC: 0.2 % (ref 0.0–0.2)
PLATELETS: 299 10*3/uL (ref 150–400)
RBC: 3.06 MIL/uL — ABNORMAL LOW (ref 3.87–5.11)
RDW: 15.1 % (ref 11.5–15.5)
WBC: 9.5 10*3/uL (ref 4.0–10.5)

## 2018-02-21 MED ORDER — HAEMOPHILUS B POLYSAC CONJ VAC IM SOLR
0.5000 mL | Freq: Once | INTRAMUSCULAR | Status: AC
  Administered 2018-02-21: 0.5 mL via INTRAMUSCULAR
  Filled 2018-02-21: qty 0.5

## 2018-02-21 MED ORDER — HAEMOPHILUS B POLYSAC CONJ VAC 10 MCG IJ SOLR
0.5000 mL | Freq: Once | INTRAMUSCULAR | Status: DC
  Filled 2018-02-21 (×2): qty 0.5

## 2018-02-21 MED ORDER — MENINGOCOCCAL A C Y&W-135 OLIG IM SOLR
0.5000 mL | Freq: Once | INTRAMUSCULAR | Status: AC
  Administered 2018-02-21: 0.5 mL via INTRAMUSCULAR
  Filled 2018-02-21: qty 0.5

## 2018-02-21 MED ORDER — PNEUMOCOCCAL 13-VAL CONJ VACC IM SUSP
0.5000 mL | Freq: Once | INTRAMUSCULAR | Status: AC
  Administered 2018-02-21: 0.5 mL via INTRAMUSCULAR
  Filled 2018-02-21: qty 0.5

## 2018-02-21 MED ORDER — METHOCARBAMOL 750 MG PO TABS: 750.0000 mg | ORAL_TABLET | Freq: Three times a day (TID) | ORAL | 0 refills | Status: DC

## 2018-02-21 MED ORDER — OXYCODONE HCL 10 MG PO TABS: 5.0000 mg | ORAL_TABLET | ORAL | 0 refills | Status: DC | PRN

## 2018-02-21 MED ORDER — ACETAMINOPHEN 500 MG PO TABS: 1000.0000 mg | ORAL_TABLET | Freq: Four times a day (QID) | ORAL | 0 refills | Status: DC

## 2018-02-21 NOTE — Progress Notes (Signed)
Discharge instructions reviewed with pt and prescriptions given.  Pt verbalized understanding and had no questions.  Pt discharged in stable condition via wheelchair with family.  Melven Stockard Lindsay   

## 2018-02-21 NOTE — Anesthesia Postprocedure Evaluation (Signed)
Anesthesia Post Note  Patient: Research scientist (life sciences)  Procedure(s) Performed: CLOSED REDUCTION NASAL FRACTURE Biopsy of Epiglottis Mass (N/A Nose)     Patient location during evaluation: PACU Anesthesia Type: General Level of consciousness: awake and alert Pain management: pain level controlled Vital Signs Assessment: post-procedure vital signs reviewed and stable Respiratory status: spontaneous breathing, nonlabored ventilation, respiratory function stable and patient connected to nasal cannula oxygen Cardiovascular status: blood pressure returned to baseline and stable Postop Assessment: no apparent nausea or vomiting Anesthetic complications: no    Last Vitals:  Vitals:   02/21/18 0442 02/21/18 1019  BP: 138/66 (!) 141/73  Pulse: 61 73  Resp: 18 17  Temp: 36.8 C 36.8 C  SpO2: 100% 100%    Last Pain:  Vitals:   02/21/18 1019  TempSrc: Oral  PainSc:                  Chelsey L Woodrum

## 2018-02-21 NOTE — Plan of Care (Signed)
  Problem: Activity: Goal: Risk for activity intolerance will decrease Outcome: Progressing   Problem: Nutrition: Goal: Adequate nutrition will be maintained Outcome: Progressing   Problem: Coping: Goal: Level of anxiety will decrease Outcome: Progressing   
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
## 2018-02-21 NOTE — Discharge Instructions (Signed)
You may resume your plavix on Friday 02-24-18 [facility], [place] [phone]  OPEN ABDOMINAL SURGERY: POST OP INSTRUCTIONS  Always review your discharge instruction sheet given to you by the facility where your surgery was performed.  IF YOU HAVE DISABILITY OR FAMILY LEAVE FORMS, YOU MUST BRING THEM TO THE OFFICE FOR PROCESSING.  PLEASE DO NOT GIVE THEM TO YOUR DOCTOR.  1. A prescription for pain medication may be given to you upon discharge.  Take your pain medication as prescribed, if needed.  If narcotic pain medicine is not needed, then you may take acetaminophen (Tylenol) or ibuprofen (Advil) as needed. 2. Take your usually prescribed medications unless otherwise directed. 3. If you need a refill on your pain medication, please contact your pharmacy. They will contact our office to request authorization.  Prescriptions will not be filled after 5pm or on week-ends. 4. You should follow a light diet the first few days after arrival home, such as soup and crackers, pudding, etc.unless your doctor has advised otherwise. A high-fiber, low fat diet can be resumed as tolerated.   Be sure to include lots of fluids daily. Most patients will experience some swelling and bruising on the chest and neck area.  Ice packs will help.  Swelling and bruising can take several days to resolve 5. Most patients will experience some swelling and bruising in the area of the incision. Ice pack will help. Swelling and bruising can take several days to resolve..  6. It is common to experience some constipation if taking pain medication after surgery.  Increasing fluid intake and taking a stool softener will usually help or prevent this problem from occurring.  A mild laxative (Milk of Magnesia or Miralax) should be taken according to package directions if there are no bowel movements after 48 hours. 7.  You may have steri-strips (small skin tapes) in place directly over the incision.  These strips  should be left on the skin for 7-10 days.  If your surgeon used skin glue on the incision, you may shower in 24 hours.  The glue will flake off over the next 2-3 weeks.  Any sutures or staples will be removed at the office during your follow-up visit. You may find that a light gauze bandage over your incision may keep your staples from being rubbed or pulled. You may shower and replace the bandage daily. 8. ACTIVITIES:  You may resume regular (light) daily activities beginning the next day--such as daily self-care, walking, climbing stairs--gradually increasing activities as tolerated.  You may have sexual intercourse when it is comfortable.  Refrain from any heavy lifting or straining until approved by your doctor. a. You may drive when you no longer are taking prescription pain medication, you can comfortably wear a seatbelt, and you can safely maneuver your car and apply brakes b. Return to Work: ___________________________________ 35. You should see your doctor in the office for a follow-up appointment approximately two weeks after your surgery.  Make sure that you call for this appointment within a day or two after you arrive home to insure a convenient appointment time. OTHER INSTRUCTIONS:  _____________________________________________________________ _____________________________________________________________  WHEN TO CALL YOUR DOCTOR: 1. Fever over 101.0 2. Inability to urinate 3. Nausea and/or vomiting 4. Extreme swelling or bruising 5. Continued bleeding from incision. 6. Increased pain, redness, or drainage from the incision. 7. Difficulty swallowing or breathing 8. Muscle cramping or spasms. 9. Numbness or tingling in hands or feet or around  lips.  The clinic staff is available to answer your questions during regular business hours.  Please dont hesitate to call and ask to speak to one of the nurses if you have concerns.  For further questions, please visit  [email or web address]   Rib Fracture A rib fracture is a break or crack in one of the bones of the ribs. The ribs are like a cage that goes around your upper chest. A broken or cracked rib is often painful, but most do not cause other problems. Most rib fractures heal on their own in 1-3 months. Follow these instructions at home:  Avoid activities that cause pain to the injured area. Protect your injured area.  Slowly increase activity as told by your doctor.  Take medicine as told by your doctor.  Put ice on the injured area for the first 1-2 days after you have been treated or as told by your doctor. ? Put ice in a plastic bag. ? Place a towel between your skin and the bag. ? Leave the ice on for 15-20 minutes at a time, every 2 hours while you are awake.  Do deep breathing as told by your doctor. You may be told to: ? Take deep breaths many times a day. ? Cough many times a day while hugging a pillow. ? Use a device (incentive spirometer) to perform deep breathing many times a day.  Drink enough fluids to keep your pee (urine) clear or pale yellow.  Do not wear a rib belt or binder. These do not allow you to breathe deeply. Get help right away if:  You have a fever.  You have trouble breathing.  You cannot stop coughing.  You cough up thick or bloody spit (mucus).  You feel sick to your stomach (nauseous), throw up (vomit), or have belly (abdominal) pain.  Your pain gets worse and medicine does not help. This information is not intended to replace advice given to you by your health care provider. Make sure you discuss any questions you have with your health care provider. Document Released: 01/06/2008 Document Revised: 09/04/2015 Document Reviewed: 05/31/2012 Elsevier Interactive Patient Education  2018 [place] because of your facial fractures A soft-food meal plan includes foods that are safe and easy to swallow. This meal plan  typically is used:  If you are having trouble chewing or swallowing foods.  As a transition meal plan after only having had liquid meals for a long period.  What do I need to know about the soft-food meal plan? A soft-food meal plan includes tender foods that are soft and easy to chew and swallow. In most cases, bite-sized pieces of food are easier to swallow. A bite-sized piece is about  inch or smaller. Foods in this plan do not need to be ground or pureed. Foods that are very hard, crunchy, or sticky should be avoided. Also, breads, cereals, yogurts, and desserts with nuts, seeds, or fruits should be avoided. What foods can I eat? Grains Rice and wild rice. Moist bread, dressing, pasta, and noodles. Well-moistened dry or cooked cereals, such as farina (cooked wheat cereal), oatmeal, or grits. Biscuits, breads, muffins, pancakes, and waffles that have been well moistened. Vegetables Shredded lettuce. Cooked, tender vegetables, including potatoes without skins. Vegetable juices. Broths or creamed soups made with vegetables that are not stringy or chewy. Strained tomatoes (without seeds). Fruits Canned or well-cooked fruits. Soft (ripe), peeled fresh fruits, such as peaches, nectarines, kiwi, cantaloupe, honeydew melon, and watermelon (  without seeds). Soft berries with small seeds, such as strawberries. Fruit juices (without pulp). Meats and Other Protein Sources Moist, tender, lean beef. Mutton. Lamb. Veal. Chicken. [place]. Liver. Ham. Fish without bones. Eggs. Dairy Milk, milk drinks, and cream. Plain cream cheese and cottage cheese. Plain yogurt. Sweets/Desserts Flavored gelatin desserts. Custard. Plain ice cream, frozen yogurt, sherbet, milk shakes, and malts. Plain cakes and cookies. Plain hard candy. Other Butter, margarine (without trans fat), and cooking oils. Mayonnaise. Cream sauces. Mild spices, salt, and sugar. Syrup, molasses, honey, and jelly. The items listed above may not be  a complete list of recommended foods or beverages. Contact your dietitian for more options. What foods are not recommended? Grains Dry bread, toast, crackers that have not been moistened. Coarse or dry cereals, such as bran, granola, and shredded wheat. Tough or chewy crusty breads, such as [place] bread or baguettes. Vegetables Corn. Raw vegetables except shredded lettuce. Cooked vegetables that are tough or stringy. Tough, crisp, fried potatoes and potato skins. Fruits Fresh fruits with skins or seeds or both, such as apples, pears, or grapes. Stringy, high-pulp fruits, such as papaya, pineapple, coconut, or mango. Fruit leather, fruit roll-ups, and all dried fruits. Meats and Other Protein Sources Sausages and hot dogs. Meats with gristle. Fish with bones. Nuts, seeds, and chunky peanut or other nut butters. Sweets/Desserts Cakes or cookies that are very dry or chewy. The items listed above may not be a complete list of foods and beverages to avoid. Contact your dietitian for more information. This information is not intended to replace advice given to you by your health care provider. Make sure you discuss any questions you have with your health care provider. Document Released: 07/06/2007 Document Revised: 09/04/2015 Document Reviewed: 02/23/2013 Elsevier Interactive Patient Education  2017 [place].  For Facial Fractures: No nose blowing.  Nasal saline spray.  Ice packs to face TID to minimize edema.  Soft diet x 4 weeks.

## 2018-02-22 ENCOUNTER — Encounter (HOSPITAL_COMMUNITY): Payer: Self-pay | Admitting: Vascular Surgery

## 2018-02-24 DIAGNOSIS — S022XXA Fracture of nasal bones, initial encounter for closed fracture: Secondary | ICD-10-CM

## 2018-02-24 DIAGNOSIS — J387 Other diseases of larynx: Secondary | ICD-10-CM

## 2018-02-24 DIAGNOSIS — S022XXD Fracture of nasal bones, subsequent encounter for fracture with routine healing: Secondary | ICD-10-CM | POA: Insufficient documentation

## 2018-02-24 HISTORY — DX: Other diseases of larynx: J38.7

## 2018-02-24 HISTORY — DX: Fracture of nasal bones, initial encounter for closed fracture: S02.2XXA

## 2018-02-24 HISTORY — DX: Fracture of nasal bones, subsequent encounter for fracture with routine healing: S02.2XXD

## 2018-02-25 DIAGNOSIS — Z9114 Patient's other noncompliance with medication regimen: Secondary | ICD-10-CM | POA: Diagnosis not present

## 2018-02-25 DIAGNOSIS — D62 Acute posthemorrhagic anemia: Secondary | ICD-10-CM | POA: Diagnosis not present

## 2018-02-25 DIAGNOSIS — S0003XD Contusion of scalp, subsequent encounter: Secondary | ICD-10-CM | POA: Diagnosis not present

## 2018-02-25 DIAGNOSIS — Z7902 Long term (current) use of antithrombotics/antiplatelets: Secondary | ICD-10-CM | POA: Diagnosis not present

## 2018-02-25 DIAGNOSIS — S27329D Contusion of lung, unspecified, subsequent encounter: Secondary | ICD-10-CM | POA: Diagnosis not present

## 2018-02-25 DIAGNOSIS — S2222XD Fracture of body of sternum, subsequent encounter for fracture with routine healing: Secondary | ICD-10-CM | POA: Diagnosis not present

## 2018-02-25 DIAGNOSIS — I2510 Atherosclerotic heart disease of native coronary artery without angina pectoris: Secondary | ICD-10-CM | POA: Diagnosis not present

## 2018-02-25 DIAGNOSIS — S0231XD Fracture of orbital floor, right side, subsequent encounter for fracture with routine healing: Secondary | ICD-10-CM | POA: Diagnosis not present

## 2018-02-25 DIAGNOSIS — S022XXD Fracture of nasal bones, subsequent encounter for fracture with routine healing: Secondary | ICD-10-CM | POA: Diagnosis not present

## 2018-02-25 DIAGNOSIS — S06330D Contusion and laceration of cerebrum, unspecified, without loss of consciousness, subsequent encounter: Secondary | ICD-10-CM | POA: Diagnosis not present

## 2018-02-25 DIAGNOSIS — I700 Atherosclerosis of aorta: Secondary | ICD-10-CM | POA: Diagnosis not present

## 2018-02-25 DIAGNOSIS — S065X0D Traumatic subdural hemorrhage without loss of consciousness, subsequent encounter: Secondary | ICD-10-CM | POA: Diagnosis not present

## 2018-02-25 DIAGNOSIS — S0240CD Maxillary fracture, right side, subsequent encounter for fracture with routine healing: Secondary | ICD-10-CM | POA: Diagnosis not present

## 2018-02-25 DIAGNOSIS — E1165 Type 2 diabetes mellitus with hyperglycemia: Secondary | ICD-10-CM | POA: Diagnosis not present

## 2018-02-25 DIAGNOSIS — S2232XD Fracture of one rib, left side, subsequent encounter for fracture with routine healing: Secondary | ICD-10-CM | POA: Diagnosis not present

## 2018-02-25 DIAGNOSIS — I70201 Unspecified atherosclerosis of native arteries of extremities, right leg: Secondary | ICD-10-CM | POA: Diagnosis not present

## 2018-02-25 DIAGNOSIS — S066X0D Traumatic subarachnoid hemorrhage without loss of consciousness, subsequent encounter: Secondary | ICD-10-CM | POA: Diagnosis not present

## 2018-02-25 DIAGNOSIS — Z86718 Personal history of other venous thrombosis and embolism: Secondary | ICD-10-CM | POA: Diagnosis not present

## 2018-02-25 DIAGNOSIS — F1120 Opioid dependence, uncomplicated: Secondary | ICD-10-CM | POA: Diagnosis not present

## 2018-02-25 DIAGNOSIS — Z95820 Peripheral vascular angioplasty status with implants and grafts: Secondary | ICD-10-CM | POA: Diagnosis not present

## 2018-02-25 DIAGNOSIS — Z9081 Acquired absence of spleen: Secondary | ICD-10-CM | POA: Diagnosis not present

## 2018-03-22 ENCOUNTER — Ambulatory Visit: Payer: PPO | Admitting: Cardiology

## 2018-03-24 ENCOUNTER — Ambulatory Visit (INDEPENDENT_AMBULATORY_CARE_PROVIDER_SITE_OTHER): Payer: PPO | Admitting: Cardiology

## 2018-03-24 ENCOUNTER — Encounter: Payer: Self-pay | Admitting: Cardiology

## 2018-03-24 VITALS — BP 128/62 | Ht 65.0 in | Wt 145.0 lb

## 2018-03-24 DIAGNOSIS — I743 Embolism and thrombosis of arteries of the lower extremities: Secondary | ICD-10-CM

## 2018-03-24 DIAGNOSIS — I10 Essential (primary) hypertension: Secondary | ICD-10-CM

## 2018-03-24 DIAGNOSIS — E782 Mixed hyperlipidemia: Secondary | ICD-10-CM

## 2018-03-24 DIAGNOSIS — R0789 Other chest pain: Secondary | ICD-10-CM

## 2018-03-24 DIAGNOSIS — E1151 Type 2 diabetes mellitus with diabetic peripheral angiopathy without gangrene: Secondary | ICD-10-CM

## 2018-03-24 DIAGNOSIS — Z720 Tobacco use: Secondary | ICD-10-CM

## 2018-03-24 HISTORY — DX: Other chest pain: R07.89

## 2018-03-24 NOTE — Patient Instructions (Signed)
Medication Instructions:  Your physician recommends that you continue on your current medications as directed. Please refer to the Current Medication list given to you today.  If you need a refill on your cardiac medications before your next appointment, please call your pharmacy.   Lab work: None  If you have labs (blood work) drawn today and your tests are completely normal, you will receive your results only by: Marland Kitchen MyChart Message (if you have MyChart) OR . A paper copy in the mail If you have any lab test that is abnormal or we need to change your treatment, we will call you to review the results.  Testing/Procedures: Your physician has requested that you have a lexiscan myoview. For further information please visit [email or web address]. Please follow instruction sheet, as given.  [facility] [address], [place], [place] [address] [phone]  Lexiscan testing instructions:  Please present to [facility] 15 minutes earlier than your appointment time to allow for registration.  You will be called with an appointment date and time by our office once it has been scheduled; please allow at least 48 hours for [place] to contact you.  No food or drink after midnight prior to your test (except for small sips of water with your medications).  Bring a medication list or all your medications with you the morning of the testing.  No caffeine, decaffeinated or chocolate products 12 hours prior to the testing.  Please be aware that the test can take up to 3-4 hours.  Should you have any problem with the appointment date or time, please call [phone].   Please call the office with any further questions or concerns.  Follow-Up: At [facility], you and your health needs are our priority.  As part of our continuing mission to provide you with exceptional heart care, we have created designated Provider Care Teams.  These Care Teams include your primary  Cardiologist (physician) and Advanced Practice Providers (APPs -  Physician Assistants and Nurse Practitioners) who all work together to provide you with the care you need, when you need it.  You will need a follow up appointment in 6 months.  Please call our office 2 months in advance to schedule this appointment.  You may see another member of our [employer] Provider Team in [place]: Jenne Campus, MD . Shirlee More, MD  Any Other Special Instructions Will Be Listed Below (If Applicable).

## 2018-03-24 NOTE — Addendum Note (Signed)
Addended by: Mattie Marlin on: 03/24/2018 09:40 AM   Modules accepted: Orders
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
## 2018-04-07 DIAGNOSIS — I350 Nonrheumatic aortic (valve) stenosis: Secondary | ICD-10-CM | POA: Diagnosis not present

## 2018-04-07 DIAGNOSIS — Z6822 Body mass index (BMI) 22.0-22.9, adult: Secondary | ICD-10-CM | POA: Diagnosis not present

## 2018-04-07 DIAGNOSIS — I2510 Atherosclerotic heart disease of native coronary artery without angina pectoris: Secondary | ICD-10-CM | POA: Diagnosis not present

## 2018-04-07 DIAGNOSIS — I10 Essential (primary) hypertension: Secondary | ICD-10-CM | POA: Diagnosis not present

## 2018-04-07 DIAGNOSIS — F1111 Opioid abuse, in remission: Secondary | ICD-10-CM | POA: Diagnosis not present

## 2018-04-07 DIAGNOSIS — I743 Embolism and thrombosis of arteries of the lower extremities: Secondary | ICD-10-CM | POA: Diagnosis not present

## 2018-04-07 DIAGNOSIS — G894 Chronic pain syndrome: Secondary | ICD-10-CM | POA: Diagnosis not present

## 2018-04-07 DIAGNOSIS — I739 Peripheral vascular disease, unspecified: Secondary | ICD-10-CM | POA: Diagnosis not present

## 2018-04-07 DIAGNOSIS — Z760 Encounter for issue of repeat prescription: Secondary | ICD-10-CM | POA: Diagnosis not present

## 2018-04-07 DIAGNOSIS — Z720 Tobacco use: Secondary | ICD-10-CM | POA: Diagnosis not present

## 2018-04-07 DIAGNOSIS — E1142 Type 2 diabetes mellitus with diabetic polyneuropathy: Secondary | ICD-10-CM | POA: Diagnosis not present

## 2018-05-02 ENCOUNTER — Telehealth: Payer: Self-pay | Admitting: Cardiology

## 2018-05-02 NOTE — Telephone Encounter (Signed)
Left message for patient to call back so we can discuss

## 2018-05-02 NOTE — Telephone Encounter (Signed)
Has anxiety about having nuc at [place]

## 2018-05-03 DIAGNOSIS — F1120 Opioid dependence, uncomplicated: Secondary | ICD-10-CM | POA: Diagnosis not present

## 2018-05-22 DIAGNOSIS — F1120 Opioid dependence, uncomplicated: Secondary | ICD-10-CM | POA: Diagnosis not present

## 2018-06-08 DIAGNOSIS — F1120 Opioid dependence, uncomplicated: Secondary | ICD-10-CM | POA: Diagnosis not present

## 2018-06-17 DIAGNOSIS — E119 Type 2 diabetes mellitus without complications: Secondary | ICD-10-CM | POA: Diagnosis not present

## 2018-06-17 DIAGNOSIS — H5201 Hypermetropia, right eye: Secondary | ICD-10-CM | POA: Diagnosis not present

## 2018-06-17 DIAGNOSIS — Z7984 Long term (current) use of oral hypoglycemic drugs: Secondary | ICD-10-CM | POA: Diagnosis not present

## 2018-06-17 DIAGNOSIS — H5212 Myopia, left eye: Secondary | ICD-10-CM | POA: Diagnosis not present

## 2018-06-17 DIAGNOSIS — H52223 Regular astigmatism, bilateral: Secondary | ICD-10-CM | POA: Diagnosis not present

## 2018-06-23 DIAGNOSIS — F1120 Opioid dependence, uncomplicated: Secondary | ICD-10-CM | POA: Diagnosis not present

## 2018-09-29 ENCOUNTER — Other Ambulatory Visit (HOSPITAL_COMMUNITY): Payer: Self-pay | Admitting: Family Medicine

## 2018-09-29 DIAGNOSIS — R609 Edema, unspecified: Secondary | ICD-10-CM

## 2018-10-03 ENCOUNTER — Ambulatory Visit (HOSPITAL_COMMUNITY): Admission: RE | Admit: 2018-10-03 | Payer: Medicare Other | Source: Ambulatory Visit

## 2018-10-10 DIAGNOSIS — W19XXXA Unspecified fall, initial encounter: Secondary | ICD-10-CM

## 2018-10-10 DIAGNOSIS — E871 Hypo-osmolality and hyponatremia: Secondary | ICD-10-CM | POA: Insufficient documentation

## 2018-10-10 DIAGNOSIS — R55 Syncope and collapse: Secondary | ICD-10-CM

## 2018-10-10 HISTORY — DX: Syncope and collapse: R55

## 2018-10-10 HISTORY — DX: Unspecified fall, initial encounter: W19.XXXA

## 2018-10-10 HISTORY — DX: Hypo-osmolality and hyponatremia: E87.1

## 2018-10-11 ENCOUNTER — Ambulatory Visit: Payer: Self-pay | Admitting: Surgery

## 2018-10-25 ENCOUNTER — Encounter: Payer: Self-pay | Admitting: Surgery

## 2018-10-25 ENCOUNTER — Other Ambulatory Visit
Admission: RE | Admit: 2018-10-25 | Discharge: 2018-10-25 | Disposition: A | Discharge status: Home / Self Care | Payer: Medicare Other | Attending: Surgery | Admitting: Surgery

## 2018-10-25 ENCOUNTER — Other Ambulatory Visit: Payer: Self-pay

## 2018-10-25 ENCOUNTER — Ambulatory Visit (INDEPENDENT_AMBULATORY_CARE_PROVIDER_SITE_OTHER): Payer: Medicare Other | Admitting: Surgery

## 2018-10-25 VITALS — BP 148/73 | HR 96 | Temp 98.1°F | Resp 16 | Ht 66.0 in | Wt 166.0 lb

## 2018-10-25 DIAGNOSIS — K439 Ventral hernia without obstruction or gangrene: Secondary | ICD-10-CM

## 2018-10-25 DIAGNOSIS — I70213 Atherosclerosis of native arteries of extremities with intermittent claudication, bilateral legs: Secondary | ICD-10-CM | POA: Diagnosis not present

## 2018-10-25 LAB — CBC WITH DIFFERENTIAL/PLATELET
Abs Immature Granulocytes: 0.01 10*3/uL (ref 0.00–0.07)
Basophils Absolute: 0.1 10*3/uL (ref 0.0–0.1)
Basophils Relative: 1 %
Eosinophils Absolute: 0.1 10*3/uL (ref 0.0–0.5)
Eosinophils Relative: 1 %
HCT: 32.6 % — ABNORMAL LOW (ref 36.0–46.0)
Hemoglobin: 10.9 g/dL — ABNORMAL LOW (ref 12.0–15.0)
Immature Granulocytes: 0 %
Lymphocytes Relative: 35 %
Lymphs Abs: 1.8 10*3/uL (ref 0.7–4.0)
MCH: 30.2 pg (ref 26.0–34.0)
MCHC: 33.4 g/dL (ref 30.0–36.0)
MCV: 90.3 fL (ref 80.0–100.0)
Monocytes Absolute: 0.6 10*3/uL (ref 0.1–1.0)
Monocytes Relative: 12 %
Neutro Abs: 2.7 10*3/uL (ref 1.7–7.7)
Neutrophils Relative %: 51 %
Platelets: 399 10*3/uL (ref 150–400)
RBC: 3.61 MIL/uL — ABNORMAL LOW (ref 3.87–5.11)
RDW: 14.4 % (ref 11.5–15.5)
WBC: 5.2 10*3/uL (ref 4.0–10.5)
nRBC: 0 % (ref 0.0–0.2)

## 2018-10-25 LAB — COMPREHENSIVE METABOLIC PANEL WITH GFR
ALT: 12 U/L (ref 0–44)
AST: 18 U/L (ref 15–41)
Albumin: 4 g/dL (ref 3.5–5.0)
Alkaline Phosphatase: 83 U/L (ref 38–126)
Anion gap: 9 (ref 5–15)
BUN: 8 mg/dL (ref 6–20)
CO2: 26 mmol/L (ref 22–32)
Calcium: 8.9 mg/dL (ref 8.9–10.3)
Chloride: 100 mmol/L (ref 98–111)
Creatinine, Ser: 0.63 mg/dL (ref 0.44–1.00)
GFR calc Af Amer: >60 mL/min
GFR calc non Af Amer: >60 mL/min
Glucose, Bld: 230 mg/dL — ABNORMAL HIGH (ref 70–99)
Potassium: 3.4 mmol/L — ABNORMAL LOW (ref 3.5–5.1)
Sodium: 135 mmol/L (ref 135–145)
Total Bilirubin: 0.5 mg/dL (ref 0.3–1.2)
Total Protein: 6.9 g/dL (ref 6.5–8.1)

## 2018-10-25 LAB — PROTIME-INR
INR: 0.9 (ref 0.8–1.2)
Prothrombin Time: 12.4 s (ref 11.4–15.2)

## 2018-10-25 NOTE — Patient Instructions (Addendum)
Please go to the Lab at [facility] today for blood work. We will send the referral to [place].  CT scheduled for 11/01/2018 @ 11"30 at [place] [address].  Please pick up prep today after you have your blood drawn. Please see your follow up appointment listed below.      Ventral Hernia  A ventral hernia is a bulge of tissue from inside the abdomen that pushes through a weak area of the muscles that form the front wall of the abdomen. The tissues inside the abdomen are inside a sac (peritoneum). These tissues include the small intestine, large intestine, and the fatty tissue that covers the intestines (omentum). Sometimes, the bulge that forms a hernia contains intestines. Other hernias contain only fat. Ventral hernias do not go away without surgical treatment. There are several types of ventral hernias. You may have:  A hernia at an incision site from previous abdominal surgery (incisional hernia).  A hernia just above the belly button (epigastric hernia), or at the belly button (umbilical hernia). These types of hernias can develop from heavy lifting or straining.  A hernia that comes and goes (reducible hernia). It may be visible only when you lift or strain. This type of hernia can be pushed back into the abdomen (reduced).  A hernia that traps abdominal tissue inside the hernia (incarcerated hernia). This type of hernia does not reduce.  A hernia that cuts off blood flow to the tissues inside the hernia (strangulated hernia). The tissues can start to die if this happens. This is a very painful bulge that cannot be reduced. A strangulated hernia is a medical emergency. What are the causes? This condition is caused by abdominal tissue putting pressure on an area of weakness in the abdominal muscles. What increases the risk? The following factors may make you more likely to develop this condition:  Being female.  Being  30 or older.  Being overweight or obese.  Having had previous abdominal surgery, especially if there was an infection after surgery.  Having had an injury to the abdominal wall.  Having had several pregnancies.  Having a buildup of fluid inside the abdomen (ascites). What are the signs or symptoms? The only symptom of a ventral hernia may be a painless bulge in the abdomen. A reducible hernia may be visible only when you strain, cough, or lift. Other symptoms may include:  Dull pain.  A feeling of pressure. Signs and symptoms of a strangulated hernia may include:  Increasing pain.  Nausea and vomiting.  Pain when pressing on the hernia.  The skin over the hernia turning red or purple.  Constipation.  Blood in the stool (feces). How is this diagnosed? This condition may be diagnosed based on:  Your symptoms.  Your medical history.  A physical exam. You may be asked to cough or strain while standing. These actions increase the pressure inside your abdomen and force the hernia through the opening in your muscles. Your health care provider may try to reduce the hernia by pressing on it.  Imaging studies, such as an ultrasound or CT scan. How is this treated? This condition is treated with surgery. If you have a strangulated hernia, surgery is done as soon as possible. If your hernia is small and not incarcerated, you may be asked to lose some weight before surgery. Follow these instructions at home:  Follow instructions from your health care provider about eating or drinking restrictions.  If  you are overweight, your health care provider may recommend that you increase your activity level and eat a healthier diet.  Do not lift anything that is heavier than 10 lb (4.5 kg).  Return to your normal activities as told by your health care provider. Ask your health care provider what activities are safe for you. You may need to avoid activities that increase pressure on your  hernia.  Take over-the-counter and prescription medicines only as told by your health care provider.  Keep all follow-up visits as told by your health care provider. This is important. Contact a health care provider if:  Your hernia gets larger.  Your hernia becomes painful. Get help right away if:  Your hernia becomes increasingly painful.  You have pain along with any of the following: ? Changes in skin color in the area of the hernia. ? Nausea. ? Vomiting. ? Fever. Summary  A ventral hernia is a bulge of tissue from inside the abdomen that pushes through a weak area of the muscles that form the front wall of the abdomen.  This condition is treated with surgery, which may be urgent depending on your hernia.  Do not lift anything that is heavier than 10 lb (4.5 kg), and follow activity instructions from your health care provider. This information is not intended to replace advice given to you by your health care provider. Make sure you discuss any questions you have with your health care provider. Document Released: 03/15/2012 Document Revised: 05/11/2017 Document Reviewed: 10/18/2016 Elsevier Patient Education  2020 [employer].
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
## 2018-10-31 NOTE — Addendum Note (Signed)
Addended by: Dominga Ferry on: 10/31/2018 04:57 PM   Modules accepted: Orders

## 2018-11-01 ENCOUNTER — Other Ambulatory Visit: Payer: Self-pay

## 2018-11-01 ENCOUNTER — Ambulatory Visit
Admission: RE | Admit: 2018-11-01 | Discharge: 2018-11-01 | Disposition: A | Discharge status: Home / Self Care | Payer: Medicare Other | Source: Ambulatory Visit | Attending: Surgery | Admitting: Surgery

## 2018-11-01 DIAGNOSIS — K439 Ventral hernia without obstruction or gangrene: Secondary | ICD-10-CM | POA: Diagnosis present

## 2018-11-01 MED ORDER — IOHEXOL 300 MG/ML  SOLN
100.0000 mL | Freq: Once | INTRAMUSCULAR | Status: AC | PRN
  Administered 2018-11-01: 100 mL via INTRAVENOUS

## 2018-11-02 ENCOUNTER — Telehealth: Payer: Self-pay

## 2018-11-02 NOTE — Telephone Encounter (Signed)
Per Dr Dahlia Byes CT did not show any concerning findings. It did show a large ventral hernia. Patient to follow up to discuss this on 11/13/18. Detailed message left for the patient about this.

## 2018-11-08 ENCOUNTER — Telehealth: Payer: Self-pay | Admitting: *Deleted

## 2018-11-08 NOTE — Telephone Encounter (Signed)
Patient states she was ref to cardio. I looked and did not see one.

## 2018-11-13 ENCOUNTER — Ambulatory Visit: Payer: Medicare Other | Admitting: Surgery

## 2018-11-27 ENCOUNTER — Encounter (INDEPENDENT_AMBULATORY_CARE_PROVIDER_SITE_OTHER): Payer: Self-pay | Admitting: Vascular Surgery

## 2018-11-27 ENCOUNTER — Ambulatory Visit (INDEPENDENT_AMBULATORY_CARE_PROVIDER_SITE_OTHER): Payer: Medicare Other | Admitting: Vascular Surgery

## 2018-11-27 ENCOUNTER — Other Ambulatory Visit: Payer: Self-pay

## 2018-11-27 VITALS — BP 115/64 | HR 58 | Resp 16 | Ht 66.0 in | Wt 167.2 lb

## 2018-11-27 DIAGNOSIS — E1151 Type 2 diabetes mellitus with diabetic peripheral angiopathy without gangrene: Secondary | ICD-10-CM

## 2018-11-27 DIAGNOSIS — I739 Peripheral vascular disease, unspecified: Secondary | ICD-10-CM

## 2018-11-27 DIAGNOSIS — E782 Mixed hyperlipidemia: Secondary | ICD-10-CM

## 2018-11-27 DIAGNOSIS — I10 Essential (primary) hypertension: Secondary | ICD-10-CM

## 2018-12-06 ENCOUNTER — Ambulatory Visit: Payer: Medicare Other | Admitting: Surgery

## 2018-12-07 ENCOUNTER — Other Ambulatory Visit (INDEPENDENT_AMBULATORY_CARE_PROVIDER_SITE_OTHER): Payer: Self-pay | Admitting: Vascular Surgery

## 2018-12-07 DIAGNOSIS — I739 Peripheral vascular disease, unspecified: Secondary | ICD-10-CM
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
## 2018-12-11 ENCOUNTER — Ambulatory Visit (INDEPENDENT_AMBULATORY_CARE_PROVIDER_SITE_OTHER): Payer: Medicare Other

## 2018-12-11 ENCOUNTER — Encounter (INDEPENDENT_AMBULATORY_CARE_PROVIDER_SITE_OTHER): Payer: Self-pay | Admitting: Vascular Surgery

## 2018-12-11 ENCOUNTER — Ambulatory Visit (INDEPENDENT_AMBULATORY_CARE_PROVIDER_SITE_OTHER): Payer: Medicare Other | Admitting: Vascular Surgery

## 2018-12-11 ENCOUNTER — Other Ambulatory Visit: Payer: Self-pay

## 2018-12-11 VITALS — BP 128/75 | HR 61 | Resp 12 | Ht 66.0 in | Wt 181.0 lb

## 2018-12-11 DIAGNOSIS — I10 Essential (primary) hypertension: Secondary | ICD-10-CM | POA: Diagnosis not present

## 2018-12-11 DIAGNOSIS — I739 Peripheral vascular disease, unspecified: Secondary | ICD-10-CM

## 2018-12-11 DIAGNOSIS — E782 Mixed hyperlipidemia: Secondary | ICD-10-CM | POA: Diagnosis not present

## 2018-12-11 DIAGNOSIS — I70223 Atherosclerosis of native arteries of extremities with rest pain, bilateral legs: Secondary | ICD-10-CM

## 2018-12-11 DIAGNOSIS — E1151 Type 2 diabetes mellitus with diabetic peripheral angiopathy without gangrene: Secondary | ICD-10-CM | POA: Diagnosis not present
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
## 2018-12-13 ENCOUNTER — Ambulatory Visit: Payer: Medicare Other | Admitting: Surgery

## 2018-12-13 ENCOUNTER — Encounter (INDEPENDENT_AMBULATORY_CARE_PROVIDER_SITE_OTHER): Payer: Self-pay | Admitting: Vascular Surgery

## 2018-12-20 ENCOUNTER — Ambulatory Visit (INDEPENDENT_AMBULATORY_CARE_PROVIDER_SITE_OTHER): Payer: Medicare Other | Admitting: Surgery

## 2018-12-20 ENCOUNTER — Other Ambulatory Visit: Payer: Self-pay

## 2018-12-20 ENCOUNTER — Encounter: Payer: Self-pay | Admitting: Surgery

## 2018-12-20 VITALS — BP 138/78 | HR 59 | Temp 97.9°F | Ht 66.0 in | Wt 180.0 lb

## 2018-12-20 DIAGNOSIS — K432 Incisional hernia without obstruction or gangrene: Secondary | ICD-10-CM | POA: Diagnosis not present
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
## 2018-12-20 NOTE — Patient Instructions (Addendum)
To see Christell Faith PA at [facility] for cardiac evaluation. PH: [phone]  Your appointment with them is on September 28th at 10:30 am  Medical [address] [place], [place], [place] [address]  Prescription given for abdominal binder.   Med [facility] Medical supply Address: [address], [place], [place] [address] Hours:  Norvel Richards Phone: [phone]  Follow up here in 4 weeks.

## 2018-12-27 ENCOUNTER — Ambulatory Visit
Admission: RE | Admit: 2018-12-27 | Discharge: 2018-12-27 | Disposition: A | Discharge status: Home / Self Care | Payer: Medicare Other | Source: Ambulatory Visit | Attending: Vascular Surgery | Admitting: Vascular Surgery

## 2018-12-27 ENCOUNTER — Other Ambulatory Visit: Payer: Self-pay

## 2018-12-27 DIAGNOSIS — I70223 Atherosclerosis of native arteries of extremities with rest pain, bilateral legs: Secondary | ICD-10-CM | POA: Insufficient documentation

## 2018-12-27 LAB — POCT I-STAT CREATININE: Creatinine, Ser: 0.7 mg/dL (ref 0.44–1.00)

## 2019-01-01 ENCOUNTER — Telehealth (INDEPENDENT_AMBULATORY_CARE_PROVIDER_SITE_OTHER): Payer: Self-pay | Admitting: Vascular Surgery

## 2019-01-01 NOTE — Telephone Encounter (Signed)
I spoke with someone from CT department and they stated the patient can call an reschedule appointment. I advise patient with information and provided the number so she can reschedule.

## 2019-01-01 NOTE — Telephone Encounter (Signed)
I left a message for the patient to return call back to the office.

## 2019-01-08 ENCOUNTER — Ambulatory Visit: Payer: Medicare Other | Admitting: Physician Assistant

## 2019-01-17 ENCOUNTER — Ambulatory Visit: Payer: Medicare Other | Admitting: Surgery
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
## 2019-01-23 ENCOUNTER — Ambulatory Visit: Payer: Medicare Other | Admitting: Nurse Practitioner

## 2019-01-24 ENCOUNTER — Encounter: Payer: Self-pay | Admitting: Nurse Practitioner

## 2019-01-24 ENCOUNTER — Ambulatory Visit (INDEPENDENT_AMBULATORY_CARE_PROVIDER_SITE_OTHER): Payer: Medicare Other | Admitting: Physician Assistant

## 2019-01-24 ENCOUNTER — Other Ambulatory Visit: Payer: Self-pay

## 2019-01-24 VITALS — BP 120/60 | HR 76 | Temp 98.1°F | Ht 66.0 in | Wt 184.5 lb

## 2019-01-24 DIAGNOSIS — I350 Nonrheumatic aortic (valve) stenosis: Secondary | ICD-10-CM

## 2019-01-24 DIAGNOSIS — I70223 Atherosclerosis of native arteries of extremities with rest pain, bilateral legs: Secondary | ICD-10-CM

## 2019-01-24 DIAGNOSIS — Z01810 Encounter for preprocedural cardiovascular examination: Secondary | ICD-10-CM | POA: Diagnosis not present

## 2019-01-24 DIAGNOSIS — R0602 Shortness of breath: Secondary | ICD-10-CM | POA: Diagnosis not present

## 2019-01-24 DIAGNOSIS — I10 Essential (primary) hypertension: Secondary | ICD-10-CM

## 2019-01-24 DIAGNOSIS — Z7289 Other problems related to lifestyle: Secondary | ICD-10-CM

## 2019-01-24 DIAGNOSIS — E785 Hyperlipidemia, unspecified: Secondary | ICD-10-CM

## 2019-01-24 DIAGNOSIS — R0789 Other chest pain: Secondary | ICD-10-CM | POA: Diagnosis not present

## 2019-01-24 DIAGNOSIS — Z720 Tobacco use: Secondary | ICD-10-CM

## 2019-01-24 DIAGNOSIS — D649 Anemia, unspecified: Secondary | ICD-10-CM

## 2019-01-24 DIAGNOSIS — Z789 Other specified health status: Secondary | ICD-10-CM
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
## 2019-01-24 NOTE — Patient Instructions (Signed)
Medication Instructions:  Your physician recommends that you continue on your current medications as directed. Please refer to the Current Medication list given to you today.  If you need a refill on your cardiac medications before your next appointment, please call your pharmacy.   Lab work: Your physician recommends that you have lab work today(  If you have labs (blood work) drawn today and your tests are completely normal, you will receive your results only by: Marland Kitchen MyChart Message (if you have MyChart) OR . A paper copy in the mail If you have any lab test that is abnormal or we need to change your treatment, we will call you to review the results.  Testing/Procedures: 1- Echo  Please return to [facility] on ____ASAP__________ at _______________ AM/PM for an Echocardiogram. Your physician has requested that you have an echocardiogram. Echocardiography is a painless test that uses sound waves to create images of your heart. It provides your doctor with information about the size and shape of your heart and how well your heart's chambers and valves are working. This procedure takes approximately one hour. There are no restrictions for this procedure. Please note; depending on visual quality an IV may need to be placed.   2- [place]  Your caregiver has ordered a Stress Test with nuclear imaging. The purpose of this test is to evaluate the blood supply to your heart muscle. This procedure is referred to as a "Non-Invasive Stress Test." This is because other than having an IV started in your vein, nothing is inserted or "invades" your body. Cardiac stress tests are done to find areas of poor blood flow to the heart by determining the extent of coronary artery disease (CAD). Some patients exercise on a treadmill, which naturally increases the blood flow to your heart, while others who are  unable to walk on a treadmill due to physical limitations have a pharmacologic/chemical  stress agent called Lexiscan . This medicine will mimic walking on a treadmill by temporarily increasing your coronary blood flow.   Please note: these test may take anywhere between 2-4 hours to complete  PLEASE REPORT TO [place] AT THE FIRST DESK WILL DIRECT YOU WHERE TO GO  Date of Procedure:_____________________________________  Arrival Time for Procedure:______________________________  Instructions regarding medication:   ____ : Hold diabetes medication morning of procedure  ___x_:  Hold betablocker(s) night before procedure and morning of procedure (metoprolol)  ____:  Hold other medications as follows:_________________________________________________________________________________________________________________________________________________________________________________________________________________________________________________________________________________________  PLEASE NOTIFY THE OFFICE AT LEAST 24 HOURS IN ADVANCE IF YOU ARE UNABLE TO KEEP YOUR APPOINTMENT.  [phone] AND  PLEASE NOTIFY NUCLEAR MEDICINE AT [facility] AT LEAST 24 HOURS IN ADVANCE IF YOU ARE UNABLE TO KEEP YOUR APPOINTMENT. [phone]  How to prepare for your Myoview test:  1. Do not eat or drink after midnight 2. No caffeine for 24 hours prior to test 3. No smoking 24 hours prior to test. 4. Your medication may be taken with water.  If your doctor stopped a medication because of this test, do not take that medication. 5. Ladies, please do not wear dresses.  Skirts or pants are appropriate. Please wear a short sleeve shirt. 6. No perfume, cologne or lotion. 7. Wear comfortable walking shoes. No heels!   Follow-Up: At [facility], you and your health needs are our priority.  As part of our continuing mission to provide you with exceptional heart care, we have created designated Provider Care Teams.  These Care  Teams include your primary Cardiologist  (physician) and Advanced Practice Providers (APPs -  Physician Assistants and Nurse Practitioners) who all work together to provide you with the care you need, when you need it. You will need a follow up appointment in 2 weeks. Please establish primary cardiologist.

## 2019-01-25 ENCOUNTER — Ambulatory Visit: Payer: Medicare Other | Admitting: Cardiology

## 2019-01-25 LAB — MAGNESIUM: Magnesium: 2.2 mg/dL (ref 1.6–2.3)

## 2019-01-25 LAB — BASIC METABOLIC PANEL
BUN/Creatinine Ratio: 14 (ref 9–23)
BUN: 10 mg/dL (ref 6–24)
CO2: 21 mmol/L (ref 20–29)
Calcium: 10 mg/dL (ref 8.7–10.2)
Chloride: 101 mmol/L (ref 96–106)
Creatinine, Ser: 0.71 mg/dL (ref 0.57–1.00)
GFR calc Af Amer: 116 mL/min/{1.73_m2} (ref >59)
GFR calc non Af Amer: 100 mL/min/{1.73_m2} (ref >59)
Glucose: 144 mg/dL — ABNORMAL HIGH (ref 65–99)
Potassium: 4.8 mmol/L (ref 3.5–5.2)
Sodium: 138 mmol/L (ref 134–144)

## 2019-01-25 LAB — CBC
Hematocrit: 36 % (ref 34.0–46.6)
Hemoglobin: 12.2 g/dL (ref 11.1–15.9)
MCH: 30.6 pg (ref 26.6–33.0)
MCHC: 33.9 g/dL (ref 31.5–35.7)
MCV: 90 fL (ref 79–97)
Platelets: 635 10*3/uL — ABNORMAL HIGH (ref 150–450)
RBC: 3.99 x10E6/uL (ref 3.77–5.28)
RDW: 13.6 % (ref 11.7–15.4)
WBC: 13.7 10*3/uL — ABNORMAL HIGH (ref 3.4–10.8)

## 2019-01-26 ENCOUNTER — Telehealth: Payer: Self-pay

## 2019-01-26 DIAGNOSIS — D72829 Elevated white blood cell count, unspecified: Secondary | ICD-10-CM

## 2019-01-26 NOTE — Telephone Encounter (Signed)
Call to patient to discuss results from blood work and POC.   She verbalized understanding and ref placed for hematology.  Advised pt to call for any further questions or concerns.

## 2019-01-26 NOTE — Telephone Encounter (Signed)
-----   Message from Arvil Chaco, PA-C sent at 01/25/2019 11:36 PM EDT ----- Please let Ms. Welle know that her lab results showed elevated white blood cells and platelets. Elevated platelet and WBC counts could be due to her splenectomy or underlying inflammatory process; however, given these results, recommend referral to hematology/oncology for further blood studies / workup and evaluation to clarify the results, as well as STAT CBC with differential to confirm the accuracy of these results (and as this provides [place] just a little bit more information). Please let her know that I can certainly reach out to her before the weekend if she has any questions or concerns regarding these recommendations.

## 2019-01-29 ENCOUNTER — Ambulatory Visit: Payer: Medicare Other | Admitting: Surgery

## 2019-01-29 ENCOUNTER — Telehealth: Payer: Self-pay | Admitting: Internal Medicine

## 2019-01-29 NOTE — Telephone Encounter (Signed)
lmov to schedule echo and lexi asap

## 2019-01-31 ENCOUNTER — Ambulatory Visit (INDEPENDENT_AMBULATORY_CARE_PROVIDER_SITE_OTHER): Payer: Medicare Other | Admitting: Surgery

## 2019-01-31 ENCOUNTER — Ambulatory Visit: Payer: Medicare Other | Admitting: Surgery

## 2019-01-31 ENCOUNTER — Other Ambulatory Visit: Payer: Self-pay

## 2019-01-31 ENCOUNTER — Other Ambulatory Visit: Payer: Medicare Other

## 2019-01-31 ENCOUNTER — Encounter: Payer: Self-pay | Admitting: Surgery

## 2019-01-31 VITALS — BP 126/70 | HR 69 | Temp 97.9°F | Resp 14 | Ht 67.0 in | Wt 196.8 lb

## 2019-01-31 DIAGNOSIS — K432 Incisional hernia without obstruction or gangrene: Secondary | ICD-10-CM

## 2019-01-31 NOTE — Patient Instructions (Addendum)
Be sure to follow up with Cardiology and vascular appointments prior to seeing Dr.Pabon.     Ventral Hernia  A ventral hernia is a bulge of tissue from inside the abdomen that pushes through a weak area of the muscles that form the front wall of the abdomen. The tissues inside the abdomen are inside a sac (peritoneum). These tissues include the small intestine, large intestine, and the fatty tissue that covers the intestines (omentum). Sometimes, the bulge that forms a hernia contains intestines. Other hernias contain only fat. Ventral hernias do not go away without surgical treatment. There are several types of ventral hernias. You may have:  A hernia at an incision site from previous abdominal surgery (incisional hernia).  A hernia just above the belly button (epigastric hernia), or at the belly button (umbilical hernia). These types of hernias can develop from heavy lifting or straining.  A hernia that comes and goes (reducible hernia). It may be visible only when you lift or strain. This type of hernia can be pushed back into the abdomen (reduced).  A hernia that traps abdominal tissue inside the hernia (incarcerated hernia). This type of hernia does not reduce.  A hernia that cuts off blood flow to the tissues inside the hernia (strangulated hernia). The tissues can start to die if this happens. This is a very painful bulge that cannot be reduced. A strangulated hernia is a medical emergency. What are the causes? This condition is caused by abdominal tissue putting pressure on an area of weakness in the abdominal muscles. What increases the risk? The following factors may make you more likely to develop this condition:  Being female.  Being 91 or older.  Being overweight or obese.  Having had previous abdominal surgery, especially if there was an infection after surgery.  Having had an injury to the abdominal wall.  Having had several pregnancies.  Having a buildup of fluid  inside the abdomen (ascites). What are the signs or symptoms? The only symptom of a ventral hernia may be a painless bulge in the abdomen. A reducible hernia may be visible only when you strain, cough, or lift. Other symptoms may include:  Dull pain.  A feeling of pressure. Signs and symptoms of a strangulated hernia may include:  Increasing pain.  Nausea and vomiting.  Pain when pressing on the hernia.  The skin over the hernia turning red or purple.  Constipation.  Blood in the stool (feces). How is this diagnosed? This condition may be diagnosed based on:  Your symptoms.  Your medical history.  A physical exam. You may be asked to cough or strain while standing. These actions increase the pressure inside your abdomen and force the hernia through the opening in your muscles. Your health care provider may try to reduce the hernia by pressing on it.  Imaging studies, such as an ultrasound or CT scan. How is this treated? This condition is treated with surgery. If you have a strangulated hernia, surgery is done as soon as possible. If your hernia is small and not incarcerated, you may be asked to lose some weight before surgery. Follow these instructions at home:  Follow instructions from your health care provider about eating or drinking restrictions.  If you are overweight, your health care provider may recommend that you increase your activity level and eat a healthier diet.  Do not lift anything that is heavier than 10 lb (4.5 kg).  Return to your normal activities as told by your health care  provider. Ask your health care provider what activities are safe for you. You may need to avoid activities that increase pressure on your hernia.  Take over-the-counter and prescription medicines only as told by your health care provider.  Keep all follow-up visits as told by your health care provider. This is important. Contact a health care provider if:  Your hernia gets  larger.  Your hernia becomes painful. Get help right away if:  Your hernia becomes increasingly painful.  You have pain along with any of the following: ? Changes in skin color in the area of the hernia. ? Nausea. ? Vomiting. ? Fever. Summary  A ventral hernia is a bulge of tissue from inside the abdomen that pushes through a weak area of the muscles that form the front wall of the abdomen.  This condition is treated with surgery, which may be urgent depending on your hernia.  Do not lift anything that is heavier than 10 lb (4.5 kg), and follow activity instructions from your health care provider. This information is not intended to replace advice given to you by your health care provider. Make sure you discuss any questions you have with your health care provider. Document Released: 03/15/2012 Document Revised: 05/11/2017 Document Reviewed: 10/18/2016 Elsevier Patient Education  2020 [employer].

## 2019-02-01 ENCOUNTER — Ambulatory Visit
Admission: RE | Admit: 2019-02-01 | Discharge: 2019-02-01 | Disposition: A | Discharge status: Home / Self Care | Payer: Medicare Other | Source: Ambulatory Visit | Attending: Vascular Surgery | Admitting: Vascular Surgery

## 2019-02-01 ENCOUNTER — Other Ambulatory Visit: Payer: Self-pay

## 2019-02-01 DIAGNOSIS — I70223 Atherosclerosis of native arteries of extremities with rest pain, bilateral legs: Secondary | ICD-10-CM | POA: Insufficient documentation

## 2019-02-01 MED ORDER — IOHEXOL 350 MG/ML SOLN
125.0000 mL | Freq: Once | INTRAVENOUS | Status: AC | PRN
  Administered 2019-02-01: 12:00:00 125 mL via INTRAVENOUS
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
## 2019-02-05 ENCOUNTER — Inpatient Hospital Stay: Payer: Medicare Other | Attending: Hematology and Oncology | Admitting: Hematology and Oncology

## 2019-02-06 ENCOUNTER — Telehealth: Payer: Self-pay | Admitting: *Deleted

## 2019-02-06 ENCOUNTER — Ambulatory Visit
Admission: RE | Admit: 2019-02-06 | Discharge: 2019-02-06 | Disposition: A | Discharge status: Home / Self Care | Payer: Medicare Other | Source: Ambulatory Visit | Attending: Physician Assistant | Admitting: Physician Assistant

## 2019-02-06 ENCOUNTER — Other Ambulatory Visit: Payer: Self-pay

## 2019-02-06 DIAGNOSIS — I350 Nonrheumatic aortic (valve) stenosis: Secondary | ICD-10-CM | POA: Insufficient documentation

## 2019-02-06 DIAGNOSIS — I70223 Atherosclerosis of native arteries of extremities with rest pain, bilateral legs: Secondary | ICD-10-CM | POA: Diagnosis present

## 2019-02-06 DIAGNOSIS — R0600 Dyspnea, unspecified: Secondary | ICD-10-CM | POA: Diagnosis not present

## 2019-02-06 DIAGNOSIS — I340 Nonrheumatic mitral (valve) insufficiency: Secondary | ICD-10-CM | POA: Diagnosis not present

## 2019-02-06 NOTE — Progress Notes (Signed)
*  PRELIMINARY RESULTS* Echocardiogram 2D Echocardiogram has been performed.  Toni Logan 02/06/2019, 11:57 AM

## 2019-02-06 NOTE — Telephone Encounter (Signed)
Patient called and stated that she is still in pain from her large ventral hernia and would like to see if she can get some pain medication to hold her over until she has surgery, which surgery has not be scheduled yet. Please call and advise

## 2019-02-06 NOTE — Telephone Encounter (Signed)
Spoke with patient. She is requesting pain medication due to her pain. She stated she has tried tylenol and ibuprofen however she is suffering and would like pain medication. I let her know I will ask Dr.Pabon tomorrow when he gets here.

## 2019-02-07 NOTE — Telephone Encounter (Signed)
Spoke with to let her know that he will not call in Narcotics at this time. Instructed to use the tylenol and ibuprofen for the discomfort. Patient stated the pain clinic will not help due to her being in recovery. Patient is upset.

## 2019-02-08 ENCOUNTER — Ambulatory Visit: Payer: Medicare Other | Admitting: Internal Medicine

## 2019-02-13 ENCOUNTER — Telehealth: Payer: Self-pay

## 2019-02-13 NOTE — Telephone Encounter (Signed)
Attempted to call patient. LMTCB 02/13/2019
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
## 2019-02-14 ENCOUNTER — Ambulatory Visit: Payer: Medicare Other | Admitting: Internal Medicine
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
## 2019-02-15 NOTE — Telephone Encounter (Signed)
I spoke with the patient regarding her echo results. She was supposed to follow up with Dr. Saunders Revel yesterday, but states "I completely forgot about that."   I have advised her that I would be glad to reschedule her follow up appointment. She states she didn't have her stress test because she really does not feel comfortable with that.  I explained to the patient that if she does not have a good feeling about the stress test, it is ok to see Dr. Saunders Revel back and discuss her echo findings further with him and any other alternate testing that may need to be performed.  The patient verbalized understanding of the above and was agreeable seeing Dr. Saunders Revel back next week. She is scheduled for Wednesday 02/21/19 at 11:20 am.

## 2019-02-21 ENCOUNTER — Ambulatory Visit (INDEPENDENT_AMBULATORY_CARE_PROVIDER_SITE_OTHER): Payer: Medicare Other | Admitting: Internal Medicine

## 2019-02-21 ENCOUNTER — Encounter: Payer: Self-pay | Admitting: Internal Medicine

## 2019-02-21 ENCOUNTER — Other Ambulatory Visit: Payer: Self-pay

## 2019-02-21 VITALS — BP 136/60 | HR 73 | Ht 66.5 in | Wt 203.8 lb

## 2019-02-21 DIAGNOSIS — K439 Ventral hernia without obstruction or gangrene: Secondary | ICD-10-CM | POA: Diagnosis not present

## 2019-02-21 DIAGNOSIS — I739 Peripheral vascular disease, unspecified: Secondary | ICD-10-CM

## 2019-02-21 DIAGNOSIS — E785 Hyperlipidemia, unspecified: Secondary | ICD-10-CM

## 2019-02-21 DIAGNOSIS — I208 Other forms of angina pectoris: Secondary | ICD-10-CM

## 2019-02-21 NOTE — Patient Instructions (Addendum)
Medication Instructions:  Your physician recommends that you continue on your current medications as directed. Please refer to the Current Medication list given to you today.   *If you need a refill on your cardiac medications before your next appointment, please call your pharmacy*  Lab Work: Your physician recommends that you return for lab work in: [place].  If you have labs (blood work) drawn today and your tests are completely normal, you will receive your results only by: Marland Kitchen MyChart Message (if you have MyChart) OR . A paper copy in the mail If you have any lab test that is abnormal or we need to change your treatment, we will call you to review the results.  Testing/Procedures: Your physician has requested that you have cardiac CT. Cardiac computed tomography (CT) is a painless test that uses an x-ray machine to take clear, detailed pictures of your heart. For further information please visit [employer]uge[employer]iest[employer]tn. Please follow instruction sheet as given.   Your cardiac CT will be scheduled at one of the below locations:   [facility] [address] [place], [place] [address] (336) [place] [address] [place], [place] [address] [phone]  If scheduled at [facility], please arrive at the [facility] main entrance of [facility] 30-45 minutes prior to test start time. Proceed to the [facility] Radiology Department (first floor) to check-in and test prep.  If scheduled at [facility], please arrive 15 mins early for check-in and test prep.  Please follow these instructions carefully (unless otherwise directed):  On the Night Before the Test: . Be sure to Drink plenty of water. . Do not consume any caffeinated/decaffeinated beverages or chocolate 12 hours prior to your test. . Do not take any antihistamines 12 hours prior to your test.  On the  Day of the Test: . Drink plenty of water. Do not drink any water within one hour of the test. . Do not eat any food 4 hours prior to the test. . You may take your regular medications prior to the test.  . Take metoprolol (Lopressor) two hours prior to test. . HOLD Furosemide/Hydrochlorothiazide morning of the test.  . FEMALES- please wear underwire-free bra if available      After the Test: . Drink plenty of water. . After receiving IV contrast, you may experience a mild flushed feeling. This is normal. . On occasion, you may experience a mild rash up to 24 hours after the test. This is not dangerous. If this occurs, you can take Benadryl 25 mg and increase your fluid intake. . If you experience trouble breathing, this can be serious. If it is severe call 911 IMMEDIATELY. If it is mild, please call our office. . If you take any of these medications: Glipizide/Metformin, Avandament, Glucavance, please do not take 48 hours after completing test unless otherwise instructed.   Once we have confirmed authorization from your insurance company, we will call you to set up a date and time for your test.   For non-scheduling related questions, please contact the cardiac imaging nurse navigator should you have any questions/concerns: Marchia Bond, RN Navigator Cardiac Imaging [facility] [phone] Office   Follow-Up: At [facility], you and your health needs are our priority.  As part of our continuing mission to provide you with exceptional heart care, we have created designated Provider Care Teams.  These Care Teams  include your primary Cardiologist (physician) and Advanced Practice Providers (APPs -  Physician Assistants and Nurse Practitioners) who all work together to provide you with the care you need, when you need it.  Your next appointment:   3 months  The format for your next appointment:   In Person  Provider:    You may see DR Harrell Gave END or one  of the following Advanced Practice Providers on your designated Care Team:    Murray Hodgkins, NP  Christell Faith, PA-C  Marrianne Mood, PA-C
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
## 2019-02-22 ENCOUNTER — Telehealth: Payer: Self-pay | Admitting: Internal Medicine

## 2019-02-22 ENCOUNTER — Encounter: Payer: Self-pay | Admitting: Internal Medicine

## 2019-02-22 LAB — BASIC METABOLIC PANEL
BUN/Creatinine Ratio: 13 (ref 9–23)
BUN: 9 mg/dL (ref 6–24)
CO2: 21 mmol/L (ref 20–29)
Calcium: 9.3 mg/dL (ref 8.7–10.2)
Chloride: 95 mmol/L — ABNORMAL LOW (ref 96–106)
Creatinine, Ser: 0.71 mg/dL (ref 0.57–1.00)
GFR calc Af Amer: 116 mL/min/{1.73_m2} (ref >59)
GFR calc non Af Amer: 100 mL/min/{1.73_m2} (ref >59)
Glucose: 76 mg/dL (ref 65–99)
Potassium: 4.4 mmol/L (ref 3.5–5.2)
Sodium: 134 mmol/L (ref 134–144)

## 2019-02-22 NOTE — Telephone Encounter (Signed)
Patient calling to discuss recent testing results  ° °Please call  ° °

## 2019-02-22 NOTE — Telephone Encounter (Signed)
I spoke with the patient regarding her lab results.

## 2019-03-07 ENCOUNTER — Ambulatory Visit: Payer: Self-pay | Admitting: Surgery

## 2019-03-21 ENCOUNTER — Ambulatory Visit: Payer: Medicare Other | Admitting: Surgery

## 2019-03-28 ENCOUNTER — Ambulatory Visit: Payer: Medicare Other | Admitting: Surgery

## 2019-03-30 ENCOUNTER — Telehealth: Payer: Self-pay

## 2019-03-30 DIAGNOSIS — I70223 Atherosclerosis of native arteries of extremities with rest pain, bilateral legs: Secondary | ICD-10-CM

## 2019-03-30 NOTE — Telephone Encounter (Signed)
I recommend that she stop smoking.  Nelva Bush, MD [facility]

## 2019-03-30 NOTE — Telephone Encounter (Signed)
Message received from scheduling, " Toni Logan, Toni Logan, Toni Ivory, RN; Lorenza Evangelist, RN  Patient will need to have lab work, told her to contact [place] office.  Patient scheduled for [facility] for Jan 7,2021 11a.   Patient wants nurse to call back to speak to her about smoking, she states she can not go 12 hours without a cigarette:"  Made call to patient and let her know that labs had been placed.   Routing call to Dr. Saunders Revel to advise about smoking.

## 2019-04-04 ENCOUNTER — Ambulatory Visit: Payer: Medicare Other | Admitting: Surgery

## 2019-04-18 ENCOUNTER — Telehealth (HOSPITAL_COMMUNITY): Payer: Self-pay | Admitting: Emergency Medicine

## 2019-04-18 NOTE — Telephone Encounter (Signed)
Unable to leave VM

## 2019-04-19 ENCOUNTER — Ambulatory Visit: Admission: RE | Admit: 2019-04-19 | Payer: Medicare Other | Source: Ambulatory Visit

## 2019-04-26 ENCOUNTER — Telehealth: Payer: Self-pay | Admitting: *Deleted

## 2019-04-26 NOTE — Telephone Encounter (Signed)
10 day letter Received: 3 days ago Message Contents  Luisa Dago, RN  Good morning I am mailing patient a 10 day letter if no response by 05/07/2019 I will delete order   Thank you  Cassell Smiles 02/21/2019 4:54 PM [id number] precert pending.dp  Armando Gang [id number] AM precert for [place] location  Benancio Deeds [id number] PM NO PRECERT REQUIRED  Howie Ill 04/19/2019 11:28 AM 04/19/19 1128a lmom to call back to reschedule no show appt [facility]  Howie Ill 04/23/2019 10:38 AM 04/23/2019 1038a lmom to call back to schedule cardiac ct - mailed 10 day letter if no response by 05/07/2019 delete

## 2019-05-07 ENCOUNTER — Telehealth: Payer: Self-pay | Admitting: *Deleted

## 2019-05-07 NOTE — Telephone Encounter (Signed)
Luisa Dago, RN  I am cancelling the order for the cardiac ct , I have not been able to successfully contact this patient.     Earnestine Mealing 02/21/2019 4:54 PM [id number] precert pending.dp  Armando Gang [id number] AM precert for [place] location  Benancio Deeds [id number] PM NO PRECERT REQUIRED  Howie Ill 04/19/2019 11:28 AM 04/19/19 1128a lmom to call back to reschedule no show appt [facility]  Howie Ill 04/23/2019 10:38 AM 04/23/2019 1038a lmom to call back to schedule cardiac ct - mailed 10 day letter if no response by 05/07/2019 delete   Thank you  Jannet Askew

## 2019-05-17 ENCOUNTER — Encounter (INDEPENDENT_AMBULATORY_CARE_PROVIDER_SITE_OTHER): Payer: Self-pay

## 2019-05-17 ENCOUNTER — Ambulatory Visit (INDEPENDENT_AMBULATORY_CARE_PROVIDER_SITE_OTHER): Payer: Medicare Other | Admitting: Vascular Surgery

## 2019-05-17 ENCOUNTER — Other Ambulatory Visit: Payer: Self-pay

## 2019-05-17 ENCOUNTER — Encounter (INDEPENDENT_AMBULATORY_CARE_PROVIDER_SITE_OTHER): Payer: Self-pay | Admitting: Vascular Surgery

## 2019-05-17 VITALS — BP 115/50 | HR 72 | Resp 17 | Ht 67.0 in | Wt 214.0 lb

## 2019-05-17 DIAGNOSIS — E1152 Type 2 diabetes mellitus with diabetic peripheral angiopathy with gangrene: Secondary | ICD-10-CM

## 2019-05-17 DIAGNOSIS — I10 Essential (primary) hypertension: Secondary | ICD-10-CM | POA: Diagnosis not present

## 2019-05-17 DIAGNOSIS — E785 Hyperlipidemia, unspecified: Secondary | ICD-10-CM | POA: Diagnosis not present

## 2019-05-17 DIAGNOSIS — I70223 Atherosclerosis of native arteries of extremities with rest pain, bilateral legs: Secondary | ICD-10-CM | POA: Diagnosis not present
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
## 2019-05-21 ENCOUNTER — Telehealth (INDEPENDENT_AMBULATORY_CARE_PROVIDER_SITE_OTHER): Payer: Self-pay

## 2019-05-21 NOTE — Telephone Encounter (Signed)
Spoke with the patient and she was offered to be scheduled for angio on 05/23/19 but chose 05/30/19 instead. Patient is scheduled with Dr. Delana Meyer on 05/30/19 with a 6:45 am arrival time to the [facility]. Patient will do covid testing on 05/28/19 between 12:30-2:30 pm at the [place]. Pre-procedure instructions were discussed and will be mailed to the patient.

## 2019-05-24 ENCOUNTER — Telehealth (INDEPENDENT_AMBULATORY_CARE_PROVIDER_SITE_OTHER): Payer: Self-pay

## 2019-05-24 NOTE — Telephone Encounter (Signed)
Patient was called and given her new arrival time for her procedure with Dr. Delana Meyer on 05/30/19. Patient will arrival to the [facility] at 10:00 am. Patient expressed understanding.

## 2019-05-24 NOTE — Telephone Encounter (Signed)
Patient called stating that the last time she was seen in our office she asked Dr. Delana Meyer to write a prescription for a hospital bed and he stated she would have to go through her PCP. Patient stated she asked her PCP and was told she didn't have enough information to do that , she wanted Dr. Delana Meyer to write the prescription because she has not slept in a month. I attempted to explain that she has [facility] so she would have to go through her PCP for a hospital bed, the patient told me she was probably not going to have the procedure because she didn't have a bed to come home to. I again attempted to explain about her PCP and [facility] and the patient hung up the phone.

## 2019-05-28 ENCOUNTER — Other Ambulatory Visit: Admission: RE | Admit: 2019-05-28 | Payer: Medicare Other | Source: Ambulatory Visit

## 2019-05-29 ENCOUNTER — Other Ambulatory Visit (INDEPENDENT_AMBULATORY_CARE_PROVIDER_SITE_OTHER): Payer: Self-pay | Admitting: Nurse Practitioner

## 2019-05-30 ENCOUNTER — Telehealth: Payer: Self-pay | Admitting: Internal Medicine

## 2019-05-30 ENCOUNTER — Encounter: Admission: RE | Payer: Self-pay | Source: Home / Self Care

## 2019-05-30 ENCOUNTER — Ambulatory Visit: Admission: RE | Admit: 2019-05-30 | Payer: Medicare Other | Source: Home / Self Care | Admitting: Vascular Surgery

## 2019-05-30 SURGERY — LOWER EXTREMITY ANGIOGRAPHY
Anesthesia: Moderate Sedation | Site: Leg Lower | Laterality: Right

## 2019-05-30 NOTE — Telephone Encounter (Signed)

## 2019-05-31 ENCOUNTER — Telehealth (INDEPENDENT_AMBULATORY_CARE_PROVIDER_SITE_OTHER): Payer: Medicare Other | Admitting: Internal Medicine

## 2019-05-31 ENCOUNTER — Other Ambulatory Visit: Payer: Self-pay

## 2019-05-31 ENCOUNTER — Encounter: Payer: Self-pay | Admitting: Internal Medicine

## 2019-05-31 DIAGNOSIS — Z5329 Procedure and treatment not carried out because of patient's decision for other reasons: Secondary | ICD-10-CM

## 2019-05-31 NOTE — Progress Notes (Signed)
Patient unable to have virtual visit at scheduled time - rescheduled for in person next week.
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
## 2019-06-04 ENCOUNTER — Ambulatory Visit: Payer: Medicare Other | Admitting: Physician Assistant

## 2019-06-06 ENCOUNTER — Encounter: Payer: Self-pay | Admitting: Physician Assistant

## 2019-06-20 DIAGNOSIS — I469 Cardiac arrest, cause unspecified: Secondary | ICD-10-CM

## 2019-06-20 DIAGNOSIS — J811 Chronic pulmonary edema: Secondary | ICD-10-CM

## 2019-06-20 HISTORY — DX: Chronic pulmonary edema: J81.1

## 2019-06-20 HISTORY — DX: Cardiac arrest, cause unspecified: I46.9

## 2019-06-26 MED ORDER — OXYCODONE HCL 5 MG PO TABS
5.00 | ORAL_TABLET | ORAL | Status: DC
Start: ? — End: 2019-06-26

## 2019-06-26 MED ORDER — PANTOPRAZOLE SODIUM 40 MG PO TBEC
40.00 | DELAYED_RELEASE_TABLET | ORAL | Status: DC
Start: 2019-06-26 — End: 2019-06-26

## 2019-06-26 MED ORDER — METHADONE HCL 10 MG/ML PO CONC
95.00 | ORAL | Status: DC
Start: 2019-06-26 — End: 2019-06-26

## 2019-06-26 MED ORDER — ATORVASTATIN CALCIUM 80 MG PO TABS
80.00 | ORAL_TABLET | ORAL | Status: DC
Start: 2019-06-26 — End: 2019-06-26

## 2019-06-26 MED ORDER — ASPIRIN 81 MG PO CHEW
81.00 | CHEWABLE_TABLET | ORAL | Status: DC
Start: 2019-06-26 — End: 2019-06-26

## 2019-06-26 MED ORDER — INSULIN REGULAR HUMAN 100 UNIT/ML IJ SOLN
0.00 | INTRAMUSCULAR | Status: DC
Start: 2019-06-26 — End: 2019-06-26

## 2019-06-26 MED ORDER — SPIRONOLACTONE 25 MG PO TABS
25.00 | ORAL_TABLET | ORAL | Status: DC
Start: 2019-06-26 — End: 2019-06-26

## 2019-06-26 MED ORDER — FUROSEMIDE 40 MG PO TABS
40.00 | ORAL_TABLET | ORAL | Status: DC
Start: 2019-06-26 — End: 2019-06-26

## 2019-06-26 MED ORDER — LIDOCAINE 5 % EX PTCH
1.00 | MEDICATED_PATCH | CUTANEOUS | Status: DC
Start: 2019-06-26 — End: 2019-06-26

## 2019-06-26 MED ORDER — MAGNESIUM OXIDE 400 MG PO TABS
800.00 | ORAL_TABLET | ORAL | Status: DC
Start: 2019-06-26 — End: 2019-06-26

## 2019-06-26 MED ORDER — METOPROLOL SUCCINATE ER 25 MG PO TB24
25.00 | ORAL_TABLET | ORAL | Status: DC
Start: 2019-06-26 — End: 2019-06-26

## 2019-06-26 MED ORDER — NICOTINE POLACRILEX 2 MG MT GUM
2.00 | CHEWING_GUM | OROMUCOSAL | Status: DC
Start: ? — End: 2019-06-26

## 2019-06-26 MED ORDER — DEXTROSE 50 % IV SOLN
12.50 | INTRAVENOUS | Status: DC
Start: ? — End: 2019-06-26

## 2019-06-26 MED ORDER — GENERIC EXTERNAL MEDICATION
250.00 | Status: DC
Start: 2019-06-26 — End: 2019-06-26

## 2019-06-26 MED ORDER — MELATONIN 3 MG PO TABS
3.00 | ORAL_TABLET | ORAL | Status: DC
Start: 2019-06-26 — End: 2019-06-26

## 2019-06-26 MED ORDER — SENNOSIDES 8.6 MG PO TABS
2.00 | ORAL_TABLET | ORAL | Status: DC
Start: 2019-06-26 — End: 2019-06-26

## 2019-06-26 MED ORDER — DULOXETINE HCL 60 MG PO CPEP
60.00 | ORAL_CAPSULE | ORAL | Status: DC
Start: 2019-06-26 — End: 2019-06-26

## 2019-06-26 MED ORDER — INFLUENZA VAC SPLIT QUAD 0.5 ML IM SUSY
0.50 | PREFILLED_SYRINGE | INTRAMUSCULAR | Status: DC
Start: ? — End: 2019-06-26

## 2019-06-26 MED ORDER — POLYETHYLENE GLYCOL 3350 17 GM/SCOOP PO POWD
17.00 | ORAL | Status: DC
Start: 2019-06-26 — End: 2019-06-26

## 2019-06-26 MED ORDER — SODIUM CHLORIDE 0.9 % IV SOLN
INTRAVENOUS | Status: DC
Start: ? — End: 2019-06-26

## 2019-06-26 MED ORDER — NICOTINE 21 MG/24HR TD PT24
1.00 | MEDICATED_PATCH | TRANSDERMAL | Status: DC
Start: 2019-06-26 — End: 2019-06-26

## 2019-06-26 MED ORDER — LOSARTAN POTASSIUM 25 MG PO TABS
50.00 | ORAL_TABLET | ORAL | Status: DC
Start: 2019-06-26 — End: 2019-06-26

## 2019-06-26 MED ORDER — ACETAMINOPHEN 500 MG PO TABS
1000.00 | ORAL_TABLET | ORAL | Status: DC
Start: ? — End: 2019-06-26

## 2019-06-26 MED ORDER — CEPHALEXIN 500 MG PO CAPS
500.00 | ORAL_CAPSULE | ORAL | Status: DC
Start: 2019-06-26 — End: 2019-06-26

## 2019-06-26 MED ORDER — ALBUTEROL SULFATE (2.5 MG/3ML) 0.083% IN NEBU
2.50 | INHALATION_SOLUTION | RESPIRATORY_TRACT | Status: DC
Start: ? — End: 2019-06-26

## 2019-08-13 DIAGNOSIS — I209 Angina pectoris, unspecified: Secondary | ICD-10-CM

## 2019-08-13 HISTORY — DX: Angina pectoris, unspecified: I20.9

## 2019-10-11 ENCOUNTER — Observation Stay
Admission: EM | Admit: 2019-10-11 | Discharge: 2019-10-12 | Disposition: A | Discharge status: Home / Self Care | Payer: Medicare Other | Attending: Internal Medicine | Admitting: Internal Medicine

## 2019-10-11 ENCOUNTER — Emergency Department: Payer: Medicare Other

## 2019-10-11 ENCOUNTER — Encounter: Payer: Self-pay | Admitting: Emergency Medicine

## 2019-10-11 ENCOUNTER — Other Ambulatory Visit: Payer: Self-pay

## 2019-10-11 DIAGNOSIS — Z91013 Allergy to seafood: Secondary | ICD-10-CM | POA: Insufficient documentation

## 2019-10-11 DIAGNOSIS — I25119 Atherosclerotic heart disease of native coronary artery with unspecified angina pectoris: Secondary | ICD-10-CM | POA: Diagnosis not present

## 2019-10-11 DIAGNOSIS — Z7902 Long term (current) use of antithrombotics/antiplatelets: Secondary | ICD-10-CM | POA: Insufficient documentation

## 2019-10-11 DIAGNOSIS — Z888 Allergy status to other drugs, medicaments and biological substances status: Secondary | ICD-10-CM | POA: Insufficient documentation

## 2019-10-11 DIAGNOSIS — Z794 Long term (current) use of insulin: Secondary | ICD-10-CM | POA: Diagnosis not present

## 2019-10-11 DIAGNOSIS — F17210 Nicotine dependence, cigarettes, uncomplicated: Secondary | ICD-10-CM | POA: Diagnosis not present

## 2019-10-11 DIAGNOSIS — E1151 Type 2 diabetes mellitus with diabetic peripheral angiopathy without gangrene: Secondary | ICD-10-CM | POA: Diagnosis not present

## 2019-10-11 DIAGNOSIS — F1120 Opioid dependence, uncomplicated: Secondary | ICD-10-CM | POA: Insufficient documentation

## 2019-10-11 DIAGNOSIS — I2510 Atherosclerotic heart disease of native coronary artery without angina pectoris: Secondary | ICD-10-CM | POA: Diagnosis not present

## 2019-10-11 DIAGNOSIS — G9341 Metabolic encephalopathy: Principal | ICD-10-CM | POA: Insufficient documentation

## 2019-10-11 DIAGNOSIS — E785 Hyperlipidemia, unspecified: Secondary | ICD-10-CM | POA: Diagnosis not present

## 2019-10-11 DIAGNOSIS — T40601A Poisoning by unspecified narcotics, accidental (unintentional), initial encounter: Secondary | ICD-10-CM | POA: Diagnosis present

## 2019-10-11 DIAGNOSIS — Z9081 Acquired absence of spleen: Secondary | ICD-10-CM | POA: Insufficient documentation

## 2019-10-11 DIAGNOSIS — I739 Peripheral vascular disease, unspecified: Secondary | ICD-10-CM | POA: Insufficient documentation

## 2019-10-11 DIAGNOSIS — G8929 Other chronic pain: Secondary | ICD-10-CM | POA: Diagnosis not present

## 2019-10-11 DIAGNOSIS — Z86718 Personal history of other venous thrombosis and embolism: Secondary | ICD-10-CM | POA: Diagnosis not present

## 2019-10-11 DIAGNOSIS — G9340 Encephalopathy, unspecified: Secondary | ICD-10-CM | POA: Diagnosis present

## 2019-10-11 DIAGNOSIS — Z20822 Contact with and (suspected) exposure to covid-19: Secondary | ICD-10-CM | POA: Insufficient documentation

## 2019-10-11 DIAGNOSIS — Z79899 Other long term (current) drug therapy: Secondary | ICD-10-CM | POA: Diagnosis not present

## 2019-10-11 DIAGNOSIS — I10 Essential (primary) hypertension: Secondary | ICD-10-CM | POA: Diagnosis not present

## 2019-10-11 DIAGNOSIS — I350 Nonrheumatic aortic (valve) stenosis: Secondary | ICD-10-CM | POA: Insufficient documentation

## 2019-10-11 DIAGNOSIS — Z7982 Long term (current) use of aspirin: Secondary | ICD-10-CM | POA: Diagnosis not present

## 2019-10-11 DIAGNOSIS — E7800 Pure hypercholesterolemia, unspecified: Secondary | ICD-10-CM | POA: Insufficient documentation

## 2019-10-11 DIAGNOSIS — L97519 Non-pressure chronic ulcer of other part of right foot with unspecified severity: Secondary | ICD-10-CM | POA: Diagnosis not present

## 2019-10-11 DIAGNOSIS — Z791 Long term (current) use of non-steroidal anti-inflammatories (NSAID): Secondary | ICD-10-CM | POA: Insufficient documentation

## 2019-10-11 DIAGNOSIS — Z886 Allergy status to analgesic agent status: Secondary | ICD-10-CM | POA: Insufficient documentation

## 2019-10-11 DIAGNOSIS — E11621 Type 2 diabetes mellitus with foot ulcer: Secondary | ICD-10-CM | POA: Diagnosis not present

## 2019-10-11 DIAGNOSIS — E1140 Type 2 diabetes mellitus with diabetic neuropathy, unspecified: Secondary | ICD-10-CM | POA: Diagnosis not present

## 2019-10-11 HISTORY — DX: Encephalopathy, unspecified: G93.40

## 2019-10-11 LAB — BLOOD GAS, VENOUS
Acid-Base Excess: 9 mmol/L — ABNORMAL HIGH (ref 0.0–2.0)
Bicarbonate: 34.6 mmol/L — ABNORMAL HIGH (ref 20.0–28.0)
FIO2: 0.21
O2 Saturation: 92.3 %
Patient temperature: 37
pCO2, Ven: 51 mmHg (ref 44.0–60.0)
pH, Ven: 7.44 — ABNORMAL HIGH (ref 7.250–7.430)
pO2, Ven: 62 mmHg — ABNORMAL HIGH (ref 32.0–45.0)

## 2019-10-11 LAB — COMPREHENSIVE METABOLIC PANEL
ALT: 12 U/L (ref 0–44)
AST: 14 U/L — ABNORMAL LOW (ref 15–41)
Albumin: 3.7 g/dL (ref 3.5–5.0)
Alkaline Phosphatase: 86 U/L (ref 38–126)
Anion gap: 8 (ref 5–15)
BUN: 21 mg/dL — ABNORMAL HIGH (ref 6–20)
CO2: 32 mmol/L (ref 22–32)
Calcium: 8.7 mg/dL — ABNORMAL LOW (ref 8.9–10.3)
Chloride: 93 mmol/L — ABNORMAL LOW (ref 98–111)
Creatinine, Ser: 0.75 mg/dL (ref 0.44–1.00)
GFR calc Af Amer: >60 mL/min (ref >60)
GFR calc non Af Amer: >60 mL/min (ref >60)
Glucose, Bld: 126 mg/dL — ABNORMAL HIGH (ref 70–99)
Potassium: 3.4 mmol/L — ABNORMAL LOW (ref 3.5–5.1)
Sodium: 133 mmol/L — ABNORMAL LOW (ref 135–145)
Total Bilirubin: 0.6 mg/dL (ref 0.3–1.2)
Total Protein: 6.6 g/dL (ref 6.5–8.1)

## 2019-10-11 LAB — LIPASE, BLOOD: Lipase: 45 U/L (ref 11–51)

## 2019-10-11 LAB — CBC WITH DIFFERENTIAL/PLATELET
Abs Immature Granulocytes: 0.05 10*3/uL (ref 0.00–0.07)
Basophils Absolute: 0.1 10*3/uL (ref 0.0–0.1)
Basophils Relative: 1 %
Eosinophils Absolute: 0.1 10*3/uL (ref 0.0–0.5)
Eosinophils Relative: 1 %
HCT: 37.1 % (ref 36.0–46.0)
Hemoglobin: 12.5 g/dL (ref 12.0–15.0)
Immature Granulocytes: 0 %
Lymphocytes Relative: 17 %
Lymphs Abs: 2.3 10*3/uL (ref 0.7–4.0)
MCH: 28.8 pg (ref 26.0–34.0)
MCHC: 33.7 g/dL (ref 30.0–36.0)
MCV: 85.5 fL (ref 80.0–100.0)
Monocytes Absolute: 1.2 10*3/uL — ABNORMAL HIGH (ref 0.1–1.0)
Monocytes Relative: 9 %
Neutro Abs: 9.5 10*3/uL — ABNORMAL HIGH (ref 1.7–7.7)
Neutrophils Relative %: 72 %
Platelets: 395 10*3/uL (ref 150–400)
RBC: 4.34 MIL/uL (ref 3.87–5.11)
RDW: 17.1 % — ABNORMAL HIGH (ref 11.5–15.5)
WBC: 13.1 10*3/uL — ABNORMAL HIGH (ref 4.0–10.5)
nRBC: 0 % (ref 0.0–0.2)

## 2019-10-11 LAB — HEMOGLOBIN A1C
Hgb A1c MFr Bld: 7.6 % — ABNORMAL HIGH (ref 4.8–5.6)
Mean Plasma Glucose: 171.42 mg/dL

## 2019-10-11 LAB — SARS CORONAVIRUS 2 BY RT PCR (HOSPITAL ORDER, PERFORMED IN ~~LOC~~ HOSPITAL LAB): SARS Coronavirus 2: NEGATIVE

## 2019-10-11 LAB — GLUCOSE, CAPILLARY: Glucose-Capillary: 79 mg/dL (ref 70–99)

## 2019-10-11 MED ORDER — ADULT MULTIVITAMIN W/MINERALS CH
1.0000 | ORAL_TABLET | Freq: Every day | ORAL | Status: DC
  Administered 2019-10-11 – 2019-10-12 (×2): 1 via ORAL
  Filled 2019-10-11 (×3): qty 1

## 2019-10-11 MED ORDER — CLOPIDOGREL BISULFATE 75 MG PO TABS
75.0000 mg | ORAL_TABLET | Freq: Every day | ORAL | Status: DC
  Administered 2019-10-11 – 2019-10-12 (×2): 75 mg via ORAL
  Filled 2019-10-11 (×2): qty 1

## 2019-10-11 MED ORDER — ENOXAPARIN SODIUM 40 MG/0.4ML ~~LOC~~ SOLN
40.0000 mg | SUBCUTANEOUS | Status: DC
  Filled 2019-10-11: qty 0.4

## 2019-10-11 MED ORDER — INSULIN ASPART 100 UNIT/ML ~~LOC~~ SOLN: 0.0000 [IU] | Freq: Three times a day (TID) | SUBCUTANEOUS | Status: DC

## 2019-10-11 MED ORDER — SODIUM CHLORIDE 0.9 % IV SOLN: INTRAVENOUS | Status: DC

## 2019-10-11 MED ORDER — SENNA 8.6 MG PO TABS
1.0000 | ORAL_TABLET | Freq: Two times a day (BID) | ORAL | Status: DC
  Administered 2019-10-11 – 2019-10-12 (×2): 8.6 mg via ORAL
  Filled 2019-10-11 (×2): qty 1

## 2019-10-11 MED ORDER — ASPIRIN EC 81 MG PO TBEC
81.0000 mg | DELAYED_RELEASE_TABLET | Freq: Every day | ORAL | Status: DC
  Filled 2019-10-11: qty 1

## 2019-10-11 MED ORDER — INSULIN ASPART 100 UNIT/ML ~~LOC~~ SOLN: 0.0000 [IU] | Freq: Every day | SUBCUTANEOUS | Status: DC

## 2019-10-11 MED ORDER — ALBUTEROL SULFATE (2.5 MG/3ML) 0.083% IN NEBU: 3.0000 mL | INHALATION_SOLUTION | Freq: Four times a day (QID) | RESPIRATORY_TRACT | Status: DC | PRN

## 2019-10-11 MED ORDER — PREGABALIN 75 MG PO CAPS
150.0000 mg | ORAL_CAPSULE | Freq: Two times a day (BID) | ORAL | Status: DC
  Administered 2019-10-11 – 2019-10-12 (×2): 150 mg via ORAL
  Filled 2019-10-11 (×2): qty 2

## 2019-10-11 MED ORDER — LOSARTAN POTASSIUM 50 MG PO TABS
50.0000 mg | ORAL_TABLET | Freq: Every day | ORAL | Status: DC
  Administered 2019-10-11 – 2019-10-12 (×2): 50 mg via ORAL
  Filled 2019-10-11 (×2): qty 1

## 2019-10-11 MED ORDER — PANTOPRAZOLE SODIUM 40 MG PO TBEC
40.0000 mg | DELAYED_RELEASE_TABLET | Freq: Every day | ORAL | Status: DC
  Administered 2019-10-11 – 2019-10-12 (×2): 40 mg via ORAL
  Filled 2019-10-11 (×2): qty 1

## 2019-10-11 MED ORDER — ATORVASTATIN CALCIUM 20 MG PO TABS
80.0000 mg | ORAL_TABLET | Freq: Every evening | ORAL | Status: DC
  Administered 2019-10-11: 19:00:00 80 mg via ORAL
  Filled 2019-10-11: qty 4

## 2019-10-11 MED ORDER — METOPROLOL SUCCINATE ER 25 MG PO TB24
25.0000 mg | ORAL_TABLET | Freq: Every day | ORAL | Status: DC
  Administered 2019-10-11: 19:00:00 25 mg via ORAL
  Filled 2019-10-11: qty 1

## 2019-10-11 MED ORDER — LACTATED RINGERS IV BOLUS
1000.0000 mL | Freq: Once | INTRAVENOUS | Status: AC
  Administered 2019-10-11: 1000 mL via INTRAVENOUS

## 2019-10-11 MED ORDER — BISACODYL 10 MG RE SUPP: 10.0000 mg | Freq: Every day | RECTAL | Status: DC | PRN

## 2019-10-11 MED ORDER — ACETAMINOPHEN 650 MG RE SUPP: 650.0000 mg | Freq: Four times a day (QID) | RECTAL | Status: DC | PRN

## 2019-10-11 MED ORDER — SPIRONOLACTONE 25 MG PO TABS
25.0000 mg | ORAL_TABLET | Freq: Every day | ORAL | Status: DC
  Administered 2019-10-11 – 2019-10-12 (×2): 25 mg via ORAL
  Filled 2019-10-11 (×2): qty 1

## 2019-10-11 MED ORDER — ACETAMINOPHEN 325 MG PO TABS: 650.0000 mg | ORAL_TABLET | Freq: Four times a day (QID) | ORAL | Status: DC | PRN

## 2019-10-11 MED ORDER — MAGNESIUM OXIDE 400 (241.3 MG) MG PO TABS
800.0000 mg | ORAL_TABLET | Freq: Every day | ORAL | Status: DC
  Administered 2019-10-11 – 2019-10-12 (×2): 800 mg via ORAL
  Filled 2019-10-11 (×2): qty 2
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
## 2019-10-11 NOTE — ED Triage Notes (Signed)
Pt presents via acems with c/o unresponsiveness initially. Fire department gave 2mg  narcan with patient becoming more alert after administration. After administration, all vital signs stable for ems.

## 2019-10-11 NOTE — ED Notes (Signed)
Repeat green top sent to lab °
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
## 2019-10-11 NOTE — ED Notes (Signed)
Pt very lethargic at this time, not responding to verbal stimuli. VSS

## 2019-10-11 NOTE — ED Notes (Signed)
Pt having to be placed on 2L at this time due to oxygen saturations dropping to 85-88% on room air. MD Charna Archer aware

## 2019-10-12 DIAGNOSIS — T40601A Poisoning by unspecified narcotics, accidental (unintentional), initial encounter: Secondary | ICD-10-CM | POA: Diagnosis not present

## 2019-10-12 DIAGNOSIS — G9340 Encephalopathy, unspecified: Secondary | ICD-10-CM | POA: Diagnosis not present

## 2019-10-12 DIAGNOSIS — G9341 Metabolic encephalopathy: Secondary | ICD-10-CM | POA: Diagnosis not present

## 2019-10-12 LAB — URINALYSIS, COMPLETE (UACMP) WITH MICROSCOPIC
Bacteria, UA: NONE SEEN
Bilirubin Urine: NEGATIVE
Glucose, UA: NEGATIVE mg/dL
Hgb urine dipstick: NEGATIVE
Ketones, ur: NEGATIVE mg/dL
Leukocytes,Ua: NEGATIVE
Nitrite: NEGATIVE
Protein, ur: NEGATIVE mg/dL
Specific Gravity, Urine: 1.008 (ref 1.005–1.030)
pH: 7 (ref 5.0–8.0)

## 2019-10-12 LAB — URINE DRUG SCREEN, QUALITATIVE (ARMC ONLY)
Amphetamines, Ur Screen: NOT DETECTED
Barbiturates, Ur Screen: NOT DETECTED
Benzodiazepine, Ur Scrn: NOT DETECTED
Cannabinoid 50 Ng, Ur ~~LOC~~: POSITIVE — AB
Cocaine Metabolite,Ur ~~LOC~~: NOT DETECTED
MDMA (Ecstasy)Ur Screen: NOT DETECTED
Methadone Scn, Ur: POSITIVE — AB
Opiate, Ur Screen: NOT DETECTED
Phencyclidine (PCP) Ur S: NOT DETECTED
Tricyclic, Ur Screen: NOT DETECTED

## 2019-10-12 LAB — GLUCOSE, CAPILLARY
Glucose-Capillary: 79 mg/dL (ref 70–99)
Glucose-Capillary: 131 mg/dL — ABNORMAL HIGH (ref 70–99)

## 2019-10-12 LAB — HIV ANTIBODY (ROUTINE TESTING W REFLEX): HIV Screen 4th Generation wRfx: NONREACTIVE

## 2019-10-12 MED ORDER — METHADONE HCL 10 MG/ML PO CONC
50.0000 mg | Freq: Once | ORAL | Status: AC
  Administered 2019-10-12: 10:00:00 50 mg via ORAL
  Filled 2019-10-12: qty 5
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
## 2019-10-12 NOTE — Discharge Instructions (Signed)
Patient advised to stop smoking. She will follow-up with [facility] for her methadone daily dosing.

## 2019-10-12 NOTE — Care Management Note (Signed)
Case Management Note  Patient Details  Name: Toni Logan MRN: [id number] Date of Birth: 1970-07-02  Subjective/Objective:    RNCM reviewed chart and briefly spoke with patient.  Patient has no discharge needs.  RNCM signed off.                Action/Plan:   Expected Discharge Date:  10/12/19               Expected Discharge Plan:     In-House Referral:     Discharge planning Services     Post Acute Care Choice:    Choice offered to:     DME Arranged:    DME Agency:     HH Arranged:    HH Agency:     Status of Service:     If discussed at [employer], dates discussed:    Additional Comments:  Shelbie Hutching, RN 10/12/2019, 12:53 PM

## 2019-11-21 ENCOUNTER — Other Ambulatory Visit: Payer: Self-pay

## 2019-11-21 ENCOUNTER — Emergency Department
Admission: EM | Admit: 2019-11-21 | Discharge: 2019-11-21 | Disposition: A | Discharge status: Home / Self Care | Payer: Medicare Other | Attending: Emergency Medicine | Admitting: Emergency Medicine

## 2019-11-21 DIAGNOSIS — E1140 Type 2 diabetes mellitus with diabetic neuropathy, unspecified: Secondary | ICD-10-CM | POA: Diagnosis not present

## 2019-11-21 DIAGNOSIS — Z7984 Long term (current) use of oral hypoglycemic drugs: Secondary | ICD-10-CM | POA: Diagnosis not present

## 2019-11-21 DIAGNOSIS — I10 Essential (primary) hypertension: Secondary | ICD-10-CM | POA: Diagnosis not present

## 2019-11-21 DIAGNOSIS — Z955 Presence of coronary angioplasty implant and graft: Secondary | ICD-10-CM | POA: Diagnosis not present

## 2019-11-21 DIAGNOSIS — Z4800 Encounter for change or removal of nonsurgical wound dressing: Secondary | ICD-10-CM | POA: Diagnosis not present

## 2019-11-21 DIAGNOSIS — E1151 Type 2 diabetes mellitus with diabetic peripheral angiopathy without gangrene: Secondary | ICD-10-CM | POA: Diagnosis not present

## 2019-11-21 DIAGNOSIS — I70239 Atherosclerosis of native arteries of right leg with ulceration of unspecified site: Secondary | ICD-10-CM | POA: Insufficient documentation

## 2019-11-21 DIAGNOSIS — Z79899 Other long term (current) drug therapy: Secondary | ICD-10-CM | POA: Insufficient documentation

## 2019-11-21 DIAGNOSIS — Z5189 Encounter for other specified aftercare: Secondary | ICD-10-CM

## 2019-11-21 DIAGNOSIS — I2510 Atherosclerotic heart disease of native coronary artery without angina pectoris: Secondary | ICD-10-CM | POA: Insufficient documentation

## 2019-11-21 DIAGNOSIS — M79671 Pain in right foot: Secondary | ICD-10-CM | POA: Diagnosis present

## 2019-11-21 DIAGNOSIS — F17210 Nicotine dependence, cigarettes, uncomplicated: Secondary | ICD-10-CM | POA: Diagnosis not present

## 2019-11-21 DIAGNOSIS — L97519 Non-pressure chronic ulcer of other part of right foot with unspecified severity: Secondary | ICD-10-CM | POA: Insufficient documentation

## 2019-11-21 DIAGNOSIS — Z7982 Long term (current) use of aspirin: Secondary | ICD-10-CM | POA: Diagnosis not present

## 2019-11-21 DIAGNOSIS — L97909 Non-pressure chronic ulcer of unspecified part of unspecified lower leg with unspecified severity: Secondary | ICD-10-CM

## 2019-11-21 MED ORDER — HYDROCODONE-ACETAMINOPHEN 5-325 MG PO TABS
1.0000 | ORAL_TABLET | Freq: Once | ORAL | Status: AC
  Administered 2019-11-21: 1 via ORAL
  Filled 2019-11-21: qty 1

## 2019-11-21 NOTE — Discharge Instructions (Signed)
Keep the wound clean, dry, and covered. Follow-up with Cardiology for further wound care.

## 2019-11-21 NOTE — ED Triage Notes (Signed)
First nurse note- here for right foot ulcer.  Per EMS pt calling for ride home.  VSS with EMS

## 2019-11-21 NOTE — ED Notes (Signed)
See triage note  Presents with bleeding from top of right foot  States she has an ulcer there and the pain and bleeding woke her up around 4 am

## 2019-11-21 NOTE — ED Triage Notes (Signed)
Pt states she has an ulcer on top of R foot that began bleeding today. Wrapped in gauze by EMS. Bleeding controlled at this time. A&O, in wheelchair. Has been taking tylenol.
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
## 2020-03-17 DIAGNOSIS — S91301A Unspecified open wound, right foot, initial encounter: Secondary | ICD-10-CM

## 2020-03-17 HISTORY — DX: Unspecified open wound, right foot, initial encounter: S91.301A

## 2020-08-26 IMAGING — CT CT ABDOMEN AND PELVIS WITH CONTRAST
2 of 5 series · 16 of 46 positions shown, 18 images · IV contrast (omnipaque)
Comparison: None.

CLINICAL DATA: Ventral hernia, history of splenectomy, palpable
lump of abdominal wall

EXAM:
CT ABDOMEN AND PELVIS WITH CONTRAST
TECHNIQUE: Multidetector CT imaging of the abdomen and pelvis was performed
using the standard protocol following bolus administration of
intravenous contrast.
CONTRAST:  100mL OMNIPAQUE IOHEXOL 300 MG/ML SOLN, additional oral
enteric contrast

[Series 2: abd pelvis · axial · 0.80mm/px · z∈[-1553,-1118]mm · 13 of 99 slices shown, 15 images]
[im 6/99  soft-tissue]
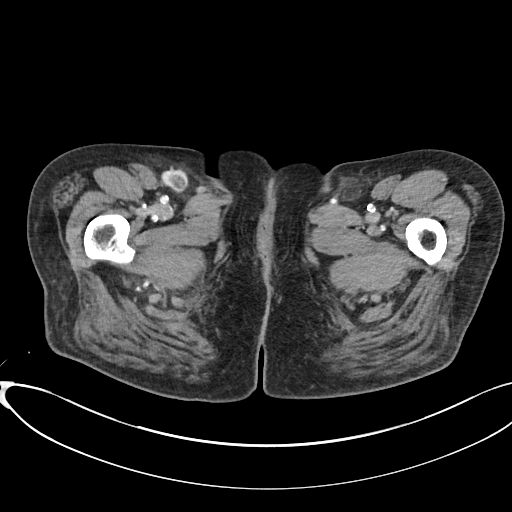
[im 6/99  bone]
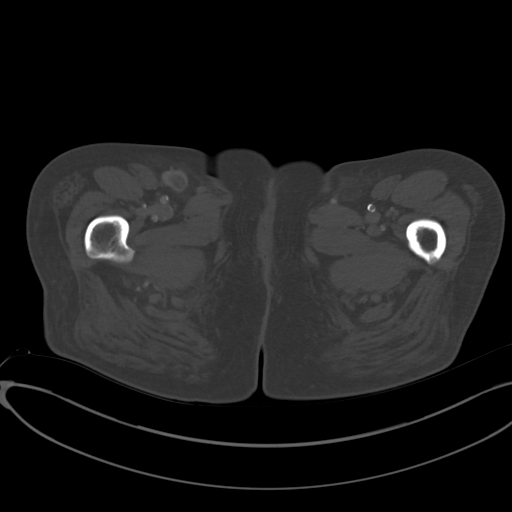
[im 11/99  soft-tissue]
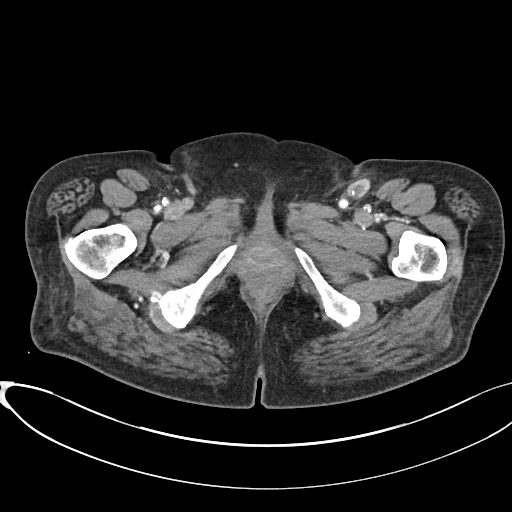
[im 22/99  soft-tissue]
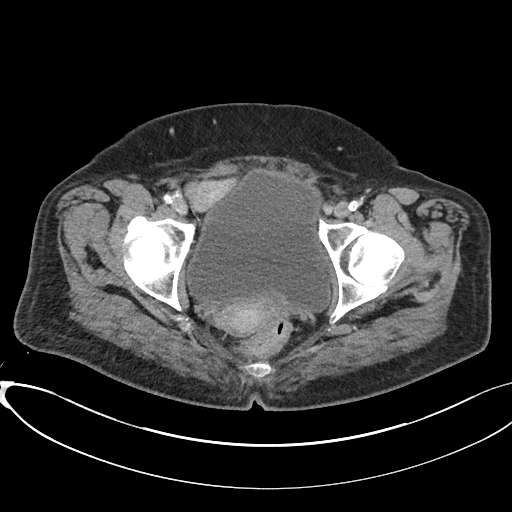
[im 28/99  soft-tissue]
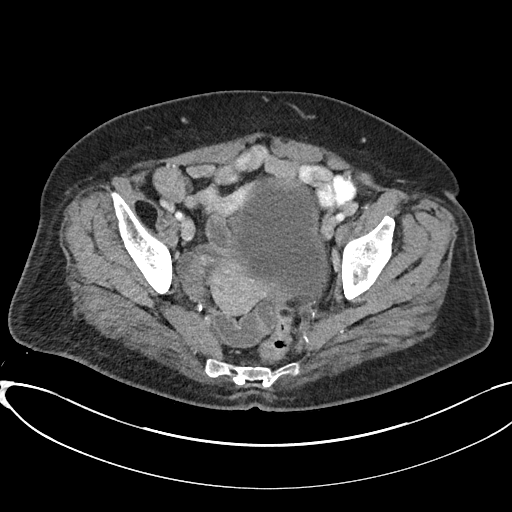
[im 33/99  soft-tissue]
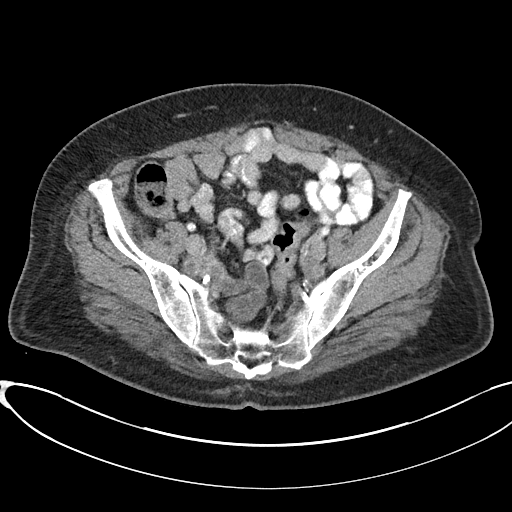
[im 44/99  soft-tissue]
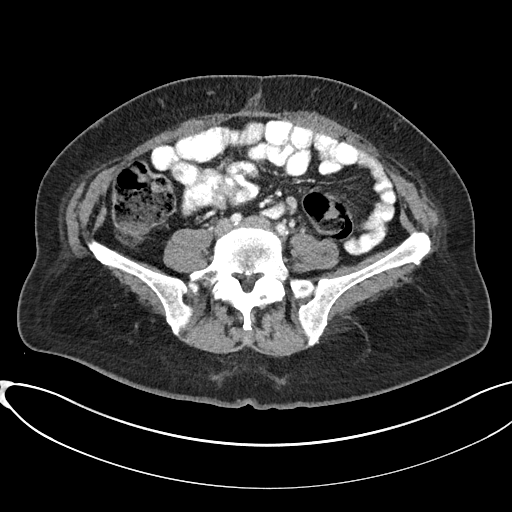
[im 50/99  soft-tissue]
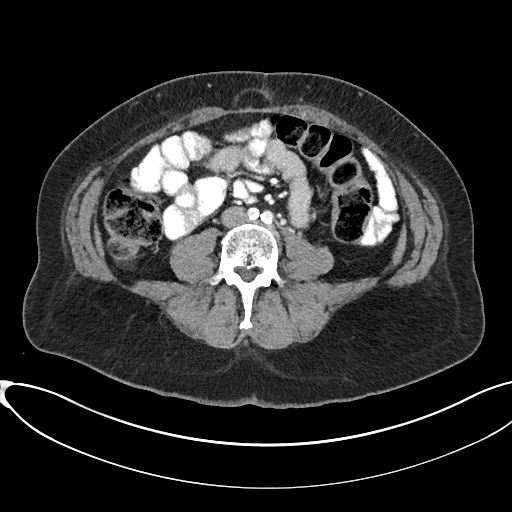
[im 55/99  soft-tissue]
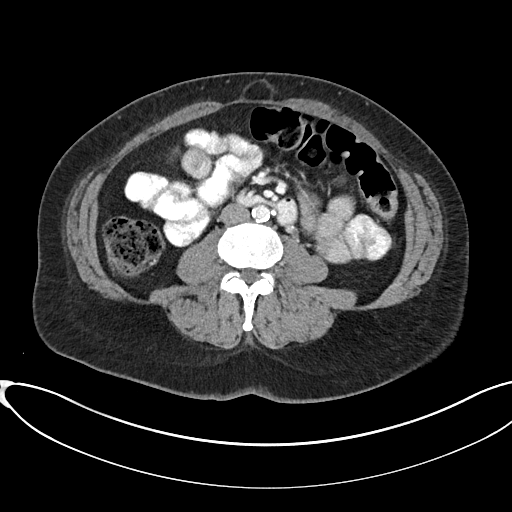
[im 66/99  soft-tissue]
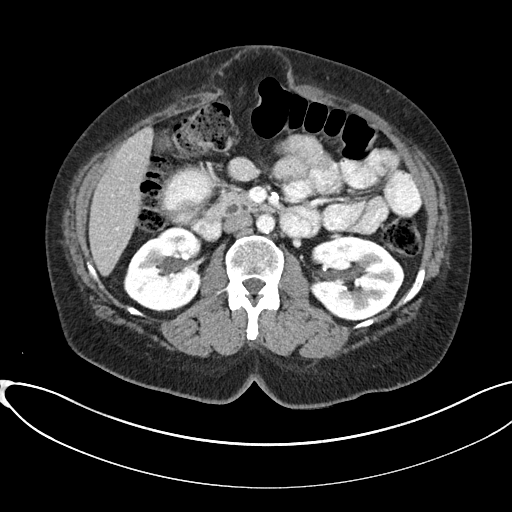
[im 66/99  bone]
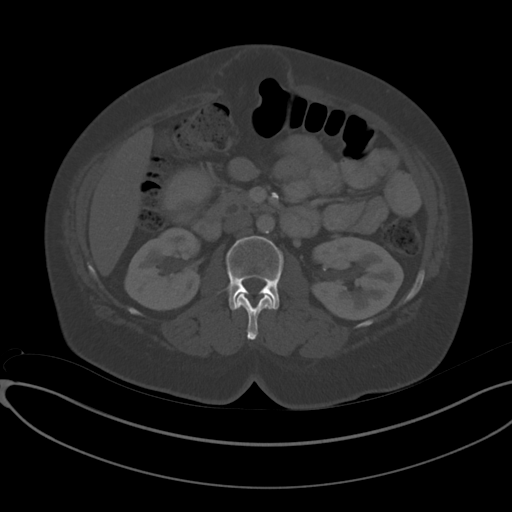
[im 71/99  soft-tissue]
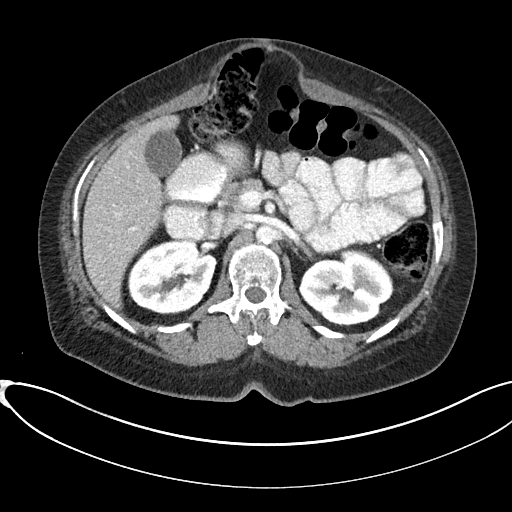
[im 77/99  soft-tissue]
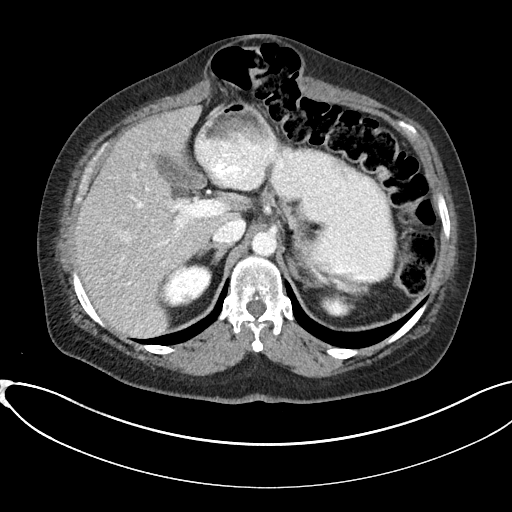
[im 88/99  soft-tissue]
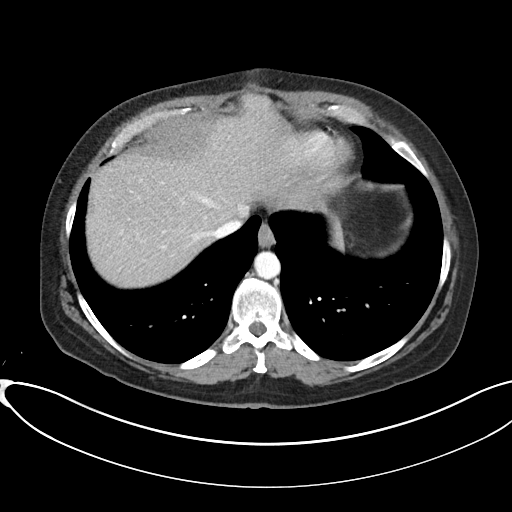
[im 93/99  soft-tissue]
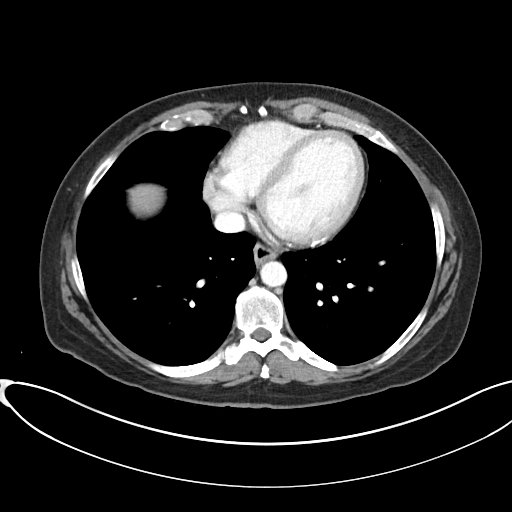

[Series 4: coronal abd pelvis · coronal · 0.79mm/px · 3 of 153 slices shown]
[im 51/153  soft-tissue]
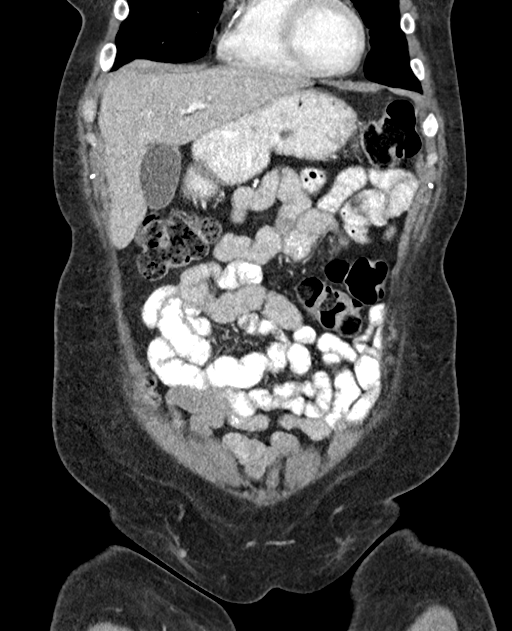
[im 68/153  soft-tissue]
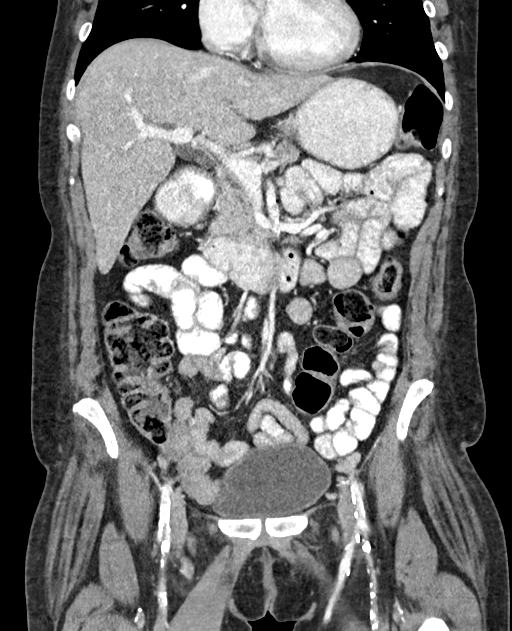
[im 85/153  soft-tissue]
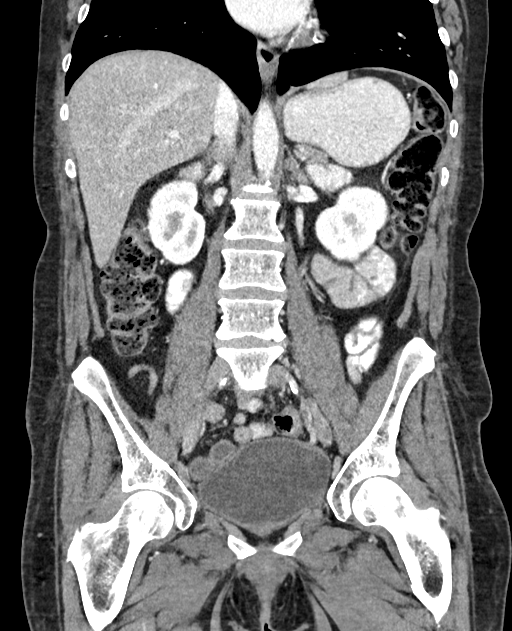

[16 of 46 positions shown; findings below may reference images not displayed]

FINDINGS: Lower chest: No acute abnormality.  Coronary artery calcifications.

Hepatobiliary: No solid liver abnormality is seen. No gallstones,
gallbladder wall thickening, or biliary dilatation.

Pancreas: Unremarkable. No pancreatic ductal dilatation or
surrounding inflammatory changes.

Spleen: Surgically absent.

Adrenals/Urinary Tract: Adrenal glands are unremarkable. Kidneys are
normal, without renal calculi, solid lesion, or hydronephrosis.
Bladder is unremarkable.

Stomach/Bowel: Stomach is within normal limits. Appendix appears
normal. No evidence of bowel wall thickening, distention, or
inflammatory changes.

Vascular/Lymphatic: Aortic atherosclerosis. No enlarged abdominal or
pelvic lymph nodes.

Reproductive: No mass or other significant abnormality.

Other: There is a broad-based midline epigastric ventral hernia
containing a nonobstructed loop of transverse colon measuring 12.6 x
4.1 x 7.7 cm (series 2, image 25, series 6, image 102). There is an
additional smaller fat containing only midline ventral hernia
located just inferiorly, measuring 3.3 x 1.8 x 4.1 cm (series 2,
image 48, series 6, image 102). No abdominopelvic ascites.

Musculoskeletal: No acute or significant osseous findings.
IMPRESSION: 1. There is a broad-based midline epigastric ventral hernia
containing a nonobstructed loop of transverse colon measuring 12.6 x
4.1 x 7.7 cm (series 2, image 25, series 6, image 102). There is an
additional smaller fat containing only midline ventral hernia
located just inferiorly, measuring 3.3 x 1.8 x 4.1 cm (series 2,
image 48, series 6, image 102).

2.  Status post splenectomy.

3.  Atherosclerosis, advanced for patient age.

## 2021-02-11 ENCOUNTER — Telehealth: Payer: Self-pay | Admitting: Internal Medicine

## 2021-02-11 NOTE — Telephone Encounter (Signed)
Patient is requesting to switch providers from Dr. Saunders Revel to a provider in [place] due to location convenience. Dr. Geraldo Pitter, would you be willing to take on this patient? Please advise when able. Thank you

## 2021-02-11 NOTE — Telephone Encounter (Signed)
That is fine with me.  Nelva Bush, MD [facility]

## 2021-02-19 ENCOUNTER — Other Ambulatory Visit: Payer: Self-pay

## 2021-02-19 DIAGNOSIS — R059 Cough, unspecified: Secondary | ICD-10-CM | POA: Insufficient documentation

## 2021-02-19 DIAGNOSIS — I82409 Acute embolism and thrombosis of unspecified deep veins of unspecified lower extremity: Secondary | ICD-10-CM | POA: Insufficient documentation

## 2021-02-19 DIAGNOSIS — I2510 Atherosclerotic heart disease of native coronary artery without angina pectoris: Secondary | ICD-10-CM | POA: Insufficient documentation

## 2021-02-19 DIAGNOSIS — J449 Chronic obstructive pulmonary disease, unspecified: Secondary | ICD-10-CM | POA: Insufficient documentation

## 2021-02-19 DIAGNOSIS — G4459 Other complicated headache syndrome: Secondary | ICD-10-CM | POA: Insufficient documentation

## 2021-02-19 DIAGNOSIS — Z86718 Personal history of other venous thrombosis and embolism: Secondary | ICD-10-CM | POA: Insufficient documentation

## 2021-02-19 DIAGNOSIS — F32A Depression, unspecified: Secondary | ICD-10-CM | POA: Insufficient documentation

## 2021-02-19 DIAGNOSIS — D649 Anemia, unspecified: Secondary | ICD-10-CM | POA: Insufficient documentation

## 2021-02-19 DIAGNOSIS — E7800 Pure hypercholesterolemia, unspecified: Secondary | ICD-10-CM | POA: Insufficient documentation

## 2021-02-19 DIAGNOSIS — E119 Type 2 diabetes mellitus without complications: Secondary | ICD-10-CM | POA: Insufficient documentation

## 2021-02-19 DIAGNOSIS — I350 Nonrheumatic aortic (valve) stenosis: Secondary | ICD-10-CM | POA: Insufficient documentation

## 2021-02-19 DIAGNOSIS — F1120 Opioid dependence, uncomplicated: Secondary | ICD-10-CM | POA: Insufficient documentation

## 2021-04-08 ENCOUNTER — Ambulatory Visit: Payer: Medicaid Other | Admitting: Cardiology

## 2021-05-05 ENCOUNTER — Ambulatory Visit: Payer: Medicaid Other | Admitting: Cardiology
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
## 2021-06-12 ENCOUNTER — Ambulatory Visit: Payer: Medicaid Other | Admitting: Cardiology

## 2024-02-11 DEATH — deceased
